# Patient Record
Sex: Male | Born: 1945
Health system: Southern US, Community
[De-identification: ages and names within clinical notes are randomized; demographics above are authoritative.]

## PROBLEM LIST (undated history)

## (undated) DIAGNOSIS — I82409 Acute embolism and thrombosis of unspecified deep veins of unspecified lower extremity: Secondary | ICD-10-CM

## (undated) DIAGNOSIS — I2699 Other pulmonary embolism without acute cor pulmonale: Secondary | ICD-10-CM

## (undated) DIAGNOSIS — E119 Type 2 diabetes mellitus without complications: Secondary | ICD-10-CM

## (undated) DIAGNOSIS — J9819 Other pulmonary collapse: Secondary | ICD-10-CM

## (undated) DIAGNOSIS — R600 Localized edema: Secondary | ICD-10-CM

## (undated) DIAGNOSIS — I10 Essential (primary) hypertension: Secondary | ICD-10-CM

## (undated) DIAGNOSIS — K589 Irritable bowel syndrome without diarrhea: Secondary | ICD-10-CM

## (undated) DIAGNOSIS — I4891 Unspecified atrial fibrillation: Secondary | ICD-10-CM

## (undated) HISTORY — DX: Irritable bowel syndrome, unspecified: K58.9

## (undated) HISTORY — DX: Other pulmonary embolism without acute cor pulmonale: I26.99

## (undated) HISTORY — DX: Other pulmonary collapse: J98.19

## (undated) HISTORY — DX: Acute embolism and thrombosis of unspecified deep veins of unspecified lower extremity: I82.409

## (undated) HISTORY — DX: Essential (primary) hypertension: I10

## (undated) HISTORY — DX: Localized edema: R60.0

## (undated) HISTORY — PX: EYE SURGERY: SHX253

## (undated) HISTORY — DX: Unspecified atrial fibrillation: I48.91

---

## 1986-08-07 HISTORY — PX: OTHER SURGICAL HISTORY: SHX169

## 1990-08-07 HISTORY — PX: HERNIA REPAIR: SHX51

## 2001-01-09 ENCOUNTER — Emergency Department (HOSPITAL_COMMUNITY): Admission: EM | Admit: 2001-01-09 | Discharge: 2001-01-09 | Payer: Self-pay | Admitting: Emergency Medicine

## 2001-01-09 ENCOUNTER — Encounter: Payer: Self-pay | Admitting: Emergency Medicine

## 2007-08-22 ENCOUNTER — Ambulatory Visit: Payer: Self-pay | Admitting: *Deleted

## 2007-08-29 ENCOUNTER — Ambulatory Visit: Payer: Self-pay | Admitting: *Deleted

## 2007-09-05 ENCOUNTER — Ambulatory Visit: Payer: Self-pay | Admitting: *Deleted

## 2007-09-12 ENCOUNTER — Ambulatory Visit: Payer: Self-pay | Admitting: *Deleted

## 2007-09-19 ENCOUNTER — Ambulatory Visit: Payer: Self-pay | Admitting: *Deleted

## 2007-09-27 ENCOUNTER — Ambulatory Visit: Payer: Self-pay | Admitting: *Deleted

## 2007-10-03 ENCOUNTER — Ambulatory Visit: Payer: Self-pay | Admitting: *Deleted

## 2007-10-11 ENCOUNTER — Ambulatory Visit: Payer: Self-pay | Admitting: Vascular Surgery

## 2007-10-14 ENCOUNTER — Ambulatory Visit: Payer: Self-pay | Admitting: Surgery

## 2007-10-15 ENCOUNTER — Ambulatory Visit: Payer: Self-pay | Admitting: Vascular Surgery

## 2007-10-17 ENCOUNTER — Ambulatory Visit: Payer: Self-pay | Admitting: *Deleted

## 2007-10-31 ENCOUNTER — Ambulatory Visit: Payer: Self-pay | Admitting: *Deleted

## 2007-11-14 ENCOUNTER — Ambulatory Visit: Payer: Self-pay | Admitting: *Deleted

## 2008-10-05 ENCOUNTER — Encounter: Admission: RE | Admit: 2008-10-05 | Discharge: 2008-10-05 | Payer: Self-pay | Admitting: Otolaryngology

## 2010-03-11 ENCOUNTER — Ambulatory Visit: Payer: Self-pay | Admitting: Cardiology

## 2010-03-25 ENCOUNTER — Ambulatory Visit: Payer: Self-pay | Admitting: Cardiology

## 2010-04-20 ENCOUNTER — Ambulatory Visit: Payer: Self-pay | Admitting: Cardiology

## 2010-05-23 ENCOUNTER — Ambulatory Visit: Payer: Self-pay | Admitting: Cardiology

## 2010-06-20 ENCOUNTER — Ambulatory Visit: Payer: Self-pay | Admitting: Cardiology

## 2010-07-14 ENCOUNTER — Ambulatory Visit: Payer: Self-pay | Admitting: Vascular Surgery

## 2010-07-19 ENCOUNTER — Ambulatory Visit: Payer: Self-pay | Admitting: Cardiology

## 2010-07-21 ENCOUNTER — Ambulatory Visit: Payer: Self-pay | Admitting: Vascular Surgery

## 2010-07-28 ENCOUNTER — Ambulatory Visit: Payer: Self-pay | Admitting: Vascular Surgery

## 2010-08-04 ENCOUNTER — Ambulatory Visit: Payer: Self-pay | Admitting: Vascular Surgery

## 2010-08-05 ENCOUNTER — Ambulatory Visit: Payer: Self-pay | Admitting: Vascular Surgery

## 2010-08-11 ENCOUNTER — Ambulatory Visit: Admit: 2010-08-11 | Payer: Self-pay

## 2010-08-22 ENCOUNTER — Ambulatory Visit: Payer: Self-pay | Admitting: Cardiology

## 2010-08-24 ENCOUNTER — Encounter (HOSPITAL_BASED_OUTPATIENT_CLINIC_OR_DEPARTMENT_OTHER)
Admission: RE | Admit: 2010-08-24 | Discharge: 2010-09-06 | Payer: Self-pay | Source: Home / Self Care | Attending: Internal Medicine | Admitting: Internal Medicine

## 2010-09-02 ENCOUNTER — Ambulatory Visit: Admit: 2010-09-02 | Payer: Self-pay | Admitting: Vascular Surgery

## 2010-09-05 ENCOUNTER — Ambulatory Visit: Admit: 2010-09-05 | Payer: Self-pay | Admitting: Vascular Surgery

## 2010-09-07 ENCOUNTER — Encounter (HOSPITAL_BASED_OUTPATIENT_CLINIC_OR_DEPARTMENT_OTHER): Payer: No Typology Code available for payment source | Attending: General Surgery

## 2010-09-07 DIAGNOSIS — I872 Venous insufficiency (chronic) (peripheral): Secondary | ICD-10-CM | POA: Insufficient documentation

## 2010-09-07 DIAGNOSIS — Z79899 Other long term (current) drug therapy: Secondary | ICD-10-CM | POA: Insufficient documentation

## 2010-09-07 DIAGNOSIS — Z7901 Long term (current) use of anticoagulants: Secondary | ICD-10-CM | POA: Insufficient documentation

## 2010-09-07 DIAGNOSIS — Z86718 Personal history of other venous thrombosis and embolism: Secondary | ICD-10-CM | POA: Insufficient documentation

## 2010-09-07 DIAGNOSIS — L97309 Non-pressure chronic ulcer of unspecified ankle with unspecified severity: Secondary | ICD-10-CM | POA: Insufficient documentation

## 2010-09-12 ENCOUNTER — Encounter (HOSPITAL_BASED_OUTPATIENT_CLINIC_OR_DEPARTMENT_OTHER): Payer: No Typology Code available for payment source

## 2010-09-14 ENCOUNTER — Other Ambulatory Visit (INDEPENDENT_AMBULATORY_CARE_PROVIDER_SITE_OTHER): Payer: No Typology Code available for payment source

## 2010-09-14 DIAGNOSIS — I4891 Unspecified atrial fibrillation: Secondary | ICD-10-CM

## 2010-09-14 DIAGNOSIS — Z7901 Long term (current) use of anticoagulants: Secondary | ICD-10-CM

## 2010-09-15 ENCOUNTER — Other Ambulatory Visit: Payer: Self-pay

## 2010-09-29 ENCOUNTER — Other Ambulatory Visit (INDEPENDENT_AMBULATORY_CARE_PROVIDER_SITE_OTHER): Payer: No Typology Code available for payment source

## 2010-09-29 DIAGNOSIS — I872 Venous insufficiency (chronic) (peripheral): Secondary | ICD-10-CM

## 2010-09-29 DIAGNOSIS — Z7901 Long term (current) use of anticoagulants: Secondary | ICD-10-CM

## 2010-10-10 ENCOUNTER — Encounter (HOSPITAL_BASED_OUTPATIENT_CLINIC_OR_DEPARTMENT_OTHER): Payer: No Typology Code available for payment source | Attending: General Surgery

## 2010-10-10 DIAGNOSIS — Z7901 Long term (current) use of anticoagulants: Secondary | ICD-10-CM | POA: Insufficient documentation

## 2010-10-10 DIAGNOSIS — Z79899 Other long term (current) drug therapy: Secondary | ICD-10-CM | POA: Insufficient documentation

## 2010-10-10 DIAGNOSIS — L97309 Non-pressure chronic ulcer of unspecified ankle with unspecified severity: Secondary | ICD-10-CM | POA: Insufficient documentation

## 2010-10-10 DIAGNOSIS — I872 Venous insufficiency (chronic) (peripheral): Secondary | ICD-10-CM | POA: Insufficient documentation

## 2010-10-10 DIAGNOSIS — Z86718 Personal history of other venous thrombosis and embolism: Secondary | ICD-10-CM | POA: Insufficient documentation

## 2010-10-11 ENCOUNTER — Other Ambulatory Visit (INDEPENDENT_AMBULATORY_CARE_PROVIDER_SITE_OTHER): Payer: No Typology Code available for payment source

## 2010-10-11 DIAGNOSIS — I4891 Unspecified atrial fibrillation: Secondary | ICD-10-CM

## 2010-10-11 DIAGNOSIS — Z7901 Long term (current) use of anticoagulants: Secondary | ICD-10-CM

## 2010-10-24 ENCOUNTER — Encounter: Payer: Self-pay | Admitting: *Deleted

## 2010-10-24 DIAGNOSIS — I4891 Unspecified atrial fibrillation: Secondary | ICD-10-CM | POA: Insufficient documentation

## 2010-10-25 ENCOUNTER — Encounter: Payer: No Typology Code available for payment source | Admitting: *Deleted

## 2010-10-27 ENCOUNTER — Encounter: Payer: No Typology Code available for payment source | Admitting: *Deleted

## 2010-11-07 ENCOUNTER — Encounter (HOSPITAL_BASED_OUTPATIENT_CLINIC_OR_DEPARTMENT_OTHER): Payer: No Typology Code available for payment source

## 2010-12-05 ENCOUNTER — Ambulatory Visit (INDEPENDENT_AMBULATORY_CARE_PROVIDER_SITE_OTHER): Payer: No Typology Code available for payment source | Admitting: *Deleted

## 2010-12-05 DIAGNOSIS — I4891 Unspecified atrial fibrillation: Secondary | ICD-10-CM

## 2010-12-05 LAB — POCT INR: INR: 2.6

## 2010-12-20 NOTE — Procedures (Signed)
DUPLEX DEEP VENOUS EXAM - LOWER EXTREMITY   INDICATION:  Left leg venous ulcer.   HISTORY:  Edema:  Chronic left leg edema.  Trauma/Surgery:  Ligation of varicose veins bilaterally in 1988.  Pain:  Left leg pain.  PE:  Patient had two pulmonary emboli in 1988.  Previous DVT:  Patient has a history of significant left leg DVT  following vein ligations in 1988.  Anticoagulants:  Patient takes an aspirin daily.  Other:  Left ankle ulcer for one month.  Patient has a Greenfield filter  in his vena cava.   DUPLEX EXAM:                CFV   SFV   PopV  PTV    GSV                R  L  R  L  R  L  R   L  R  L  Thrombosis    o  o  o  o  o  o  o   o  o  o  Spontaneous   +  +  +  +  +  +  +   +  +  +  Phasic        +  +  +  +  +  +  +   +  +  +  Augmentation  +  +  +  +  +  +  +   +  +  +  Compressible  +  +  +  +  +  +  +   +  +  +  Competent     0  0  0  0  +  0  0   0  0  0   Legend:  + - yes  o - no  p - partial  D - decreased   IMPRESSION:  1. No evidence of deep vein or superficial vein thrombus or baker's      cyst bilaterally.  2. Bilateral severe deep vein incompetence.  3. Bilateral varicose veins.  4. Bilateral incompetent saphenofemoral junctions.  5. 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    _____________________________  P. Liliane Bade, M.D.   MC/MEDQ  D:  08/22/2007  T:  08/22/2007  Job:  [id number]

## 2010-12-20 NOTE — Assessment & Plan Note (Signed)
OFFICE VISIT   William Stanley, William Stanley  DOB:  [birth year]                                       09/19/2007  [id number]#:[id number]   Patient returns today for continued followup of venous stasis ulcer,  left leg.  He continues weekly Unna boot changes.  Sees MD on a two-  weekly basis.   Since last seen, he has had marked improvement in the area of  excoriation and hemorrhagic skin changes around his left ankle ulcer.  The ulcer has continued to shrink.   BP is 131/80, pulse 82 per minute and regular, respirations 18 per  minute.  O2 sat 96%.  He is alert and oriented in no acute distress.   Continue Unna boot changes on a weekly basis.  Return to see MD in two  weeks.   Balinda Quails, M.D.  Electronically Signed   PGH/MEDQ  D:  09/19/2007  T:  09/21/2007  Job:  [id number]

## 2010-12-20 NOTE — Assessment & Plan Note (Signed)
OFFICE VISIT   William, [name]  DOB:  11-May-1946                                       10/14/2007  KVQQV#:[id number]   REASON FOR VISIT:  Evaluate left leg venous stasis ulcer.   HISTORY:  This is a 65 year old gentleman who has been followed by Dr.  Madilyn Fireman for a venous stasis ulcer on the left leg.  He has been undergoing  weekly Unna boot changes and seeing an MD on a bimonthly basis.  He  comes in today, having petechial changes of the skin in the region where  the [facility] boot was as well as worsening of his ulceration and more skin  breakdown.   Blood pressure today is 165/98, pulse 87, respirations 18, temperature  98.8.  The left extremity shows papular spots in the distribution of the  Unna boot, consistent with a contact dermatitis.  The ulcer has  increased in size, by report.  It now measures approximately 3 mm in  diameter.  There is a mild amount of erythema and skin sloughing in the  gaiter region of the left foot.   PLAN:  Left leg venous stasis ulcer.  I have discontinued the Unna boot  today and will place him on Xeroform and Ace wrap.  I am also giving him  a prescription for one week's worth of antibiotics.  He will follow up  with Dr. Madilyn Fireman in two weeks.  He is going to come in for daily dressing  changes.   Jorge Ny, MD  Electronically Signed   VWB/MEDQ  D:  10/14/2007  T:  10/14/2007  Job:  [id number]   cc:   Balinda Quails, M.D.

## 2010-12-20 NOTE — Assessment & Plan Note (Signed)
OFFICE VISIT   William Stanley, William Stanley  DOB:  1946/05/10                                       08/05/2010  XBMWU#:[id number]   This is an established patient wound care followup.  The patient I saw  last week and has been previously undergoing Unna boot changes for a  left venous stasis ulcer on the medial surface of the left leg.  He had  developed contact dermatitis to the [employer] and I had prescribed to  him some triamcinolone cream and we discontinued the [employer] use,  continuing to just an Ace wrap for compression.  At this point the  patient notes that the symptoms have greatly decreased.  He is not able  to put a compression stocking over his bandages on the left side.   PHYSICAL EXAMINATION:  The petechial rash he previously had from the  left calf down to the foot has greatly improved.  There are still some  areas of papilla in the distribution of where the previous Unna boot  was.  Today there is only one shallow ulcer on the medial anterior  surface of the calf.  There are also multiple varicosities that are  evident.  There is no drainage from any area.  No signs of any gangrene.   MEDICAL DECISION MAKING:  This is a 65 year old gentleman with venous  stasis ulcers that developed contact dermatitis reaction to the [employer].  He is improving with triamcinolone cream.  I would continue with  this and I will end up referring him to the wound care center at [facility] for continuation of Profore bandages to manage his venous stasis  ulcer.  Unfortunately we do not stock this particular compression system  in our office and subsequently will have to refer him out to get his  continued care.  He will follow up with [name] in another month to see how  he is doing.  Once again, thank you for giving [name] the opportunity to  participate in this gentleman's care.     Fransisco Hertz, MD  Electronically Signed   BLC/MEDQ  D:  08/05/2010  T:  08/05/2010  Job:   [phone]

## 2010-12-20 NOTE — Assessment & Plan Note (Signed)
OFFICE VISIT   KOHEI, ANTONELLIS  DOB:  November 08, 1945                                       07/29/2010  KGMWN#:[id number]   Established patient   HISTORY OF PRESENT ILLNESS:  This patient has been undergoing Unna boot  changes in his left leg.  Most recently was found to have possibly an  allergic reaction to the Unna boot and was given some Xeroform dressing  to protect his skin from the [employer].  He continued with the Unna boot  for his venous stasis ulcers in the left leg.  The patient notes that he  feels the ulcers look better than before.  He has not had any fevers or  chills and does have a slight amount of pruritus at his left leg.   PHYSICAL EXAMINATION:  On focused evaluation he had a circumferential  petechial rash around the left calf extending down to the foot in the  distribution of where the Unna boot had been applied.  There is no  streaking erythema.  No tenderness to palpation.  There are a few  shallow ulcers on the anterior surface of the foot and also the shin  consistent with venous stasis ulcers.  There is no drainage from any of  these ulcerations.  They all look clean.   MEDICAL DECISION MAKING:  A 65 year old gentleman with a likely contact  dermatitis reaction to the [employer].  The exact allergen is unclear to  me.  He previously has been given triamcinolone cream for this problem  and had good results.  I would at this point discontinue the Unna boot  at this point and I have prescribed him triamcinolone cream 0.05% cream.  He is going to apply to the affected areas twice a day.  He is going to  wrap the leg with Kerlix and then wrap an Ace wrap around this leg to  get some additional compression.  We will then see him in a week to see  if he is improved with that intervention.  Once he completely clears his  contact dermatitis my recommendation is we proceed forward with like a  Profore compression dressing rather than  continuing with Unna boot.     Fransisco Hertz, MD  Electronically Signed   BLC/MEDQ  D:  07/29/2010  T:  07/29/2010  Job:  [phone]

## 2010-12-20 NOTE — Assessment & Plan Note (Signed)
OFFICE VISIT   CHET, GREENLEY  DOB:  [birth year]                                       07/21/2010  YSAYT#:[id number]   The patient is a 65 year old male who was last seen on December 8 for a  venous stasis ulcer in the left leg.  He returns today for an Radio broadcast assistant  change.  He has had some mild skin reaction with a rash on the medial  aspect of his leg.  The venous stasis ulcer is still present  approximately 3 cm in diameter.  The graft was covered today with a  Xeroform dressing to protect his skin from the Unna boot medication.  We  will continue Unna boot therapy for a few more weeks to see if we can  get the ulcer to heal.     Janetta Hora. Fields, MD  Electronically Signed   CEF/MEDQ  D:  07/22/2010  T:  07/22/2010  Job:  [phone]

## 2010-12-20 NOTE — Assessment & Plan Note (Signed)
OFFICE VISIT   William Stanley, William Stanley  DOB:  25-Nov-1945                                       07/14/2010  EAVWU#:[id number]   The patient is a very pleasant 65 year old gentleman who has previously  been seen by Dr. Madilyn Fireman.  He was last seen by Dr. Madilyn Fireman in 2009 for  evaluation and management of a venous stasis ulcer on his left lower  extremity.  This eventually healed following treatment with Henriette Combs  therapy.  He now returns with a new ulcer.   This ulcer arose just prior to Thanksgiving.  He has been treating it  with a cream and a dressing.  He has not appreciated any improvement in  the wound.   PAST MEDICAL HISTORY:  Deep venous thrombosis with a pulmonary embolism  in 1988, with bilateral varicose vein stripping in 1988 and Greenfield  filter placement.  He is currently in atrial fibrillation.   CURRENT MEDICATIONS:  Coumadin and aspirin.   ALLERGIES:  Penicillin.   SOCIAL HISTORY:  He is married.  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  He has 3 children.  He continues to smoke approximately 1-1/2  packs of cigarettes per day.  He does not consume alcohol.   REVIEW OF SYSTEMS:  Consistent with some chest tightness and pressure,  shortness of breath and atrial fibrillation.  Also gastroesophageal  reflux and constipation.  He does have some wheezing.  All other  interrogatories were answered in the negative fashion.   PHYSICAL FINDINGS:  A very pleasant, elderly man in no apparent  distress.  Height was 5 feet 8 inches and weight was 212 pounds.  Heart  rate 77, blood pressure 163/108 and O2 saturation was 99%.  HEENT:  PERRLA, EOMI, with normal conjunctivae.  Mucous membranes were pink and  moist.  Trachea was midline.  Auscultation of the lungs did demonstrate  some scattered wheezing.  Cardiac exam revealed him to be in atrial  fibrillation.  I did not appreciate any murmurs or gallops.  Peripheral  pulses were palpable.  He did have  a good posterior tibial pulse in his  left lower extremity.  The abdomen was soft, nontender, nondistended.  Musculoskeletal exam demonstrated no major deformities.  Neurological  exam was nonfocal.  Evaluation of his skin did demonstrate a 1 x 1-cm  ulcer on the left lower extremity.  At this time, I requested Dr.  Darrick Penna' assistance in evaluating the patient.  Dr. Darrick Penna entered the  room.  He examined the extremity.  He palpated pulses.  He did discuss  placement of Unna Boot and [employer] therapy, which we will initiate at  this time.  He also discussed saphenous vein ablation with the patient;  however, with his history of bilateral varicose vein stripping this may  not be feasible.   ASSESSMENT/PLAN:  The patient will be placed in an [employer]  He will  be seen on a weekly basis by one of our nurses for changing of the [employer].  At the end of 4 weeks, he will again be seen by [name] so that we can  evaluate the healing of his ulcer.   Wilmon Arms, PA   Janetta Hora. Fields, MD  Electronically Signed   KEL/MEDQ  D:  07/14/2010  T:  07/14/2010  Job:  [id number]   cc:   Teena Irani. Arlyce Dice, M.D.

## 2010-12-20 NOTE — Assessment & Plan Note (Signed)
OFFICE VISIT   CORA, STETSON  DOB:  10/13/1945                                       07/14/2010  QMVHQ#:[id number]   ADDENDUM:  This is an addendum to the previously dictated note reference  number [id number].   Prior to his leaving  clinic, the patient was complaining of pain in his  left lower extremity.  He did request some pain medicine.  I gave him a  prescription for Ultram 50 mg 1 tablet p.o. q.6 h. p.r.n. pain, total  number of 10 tablets were given.   Wilmon Arms, PA   Janetta Hora. Fields, MD  Electronically Signed   KEL/MEDQ  D:  07/14/2010  T:  07/14/2010  Job:  [id number]

## 2010-12-20 NOTE — Assessment & Plan Note (Signed)
OFFICE VISIT   William Stanley, William Stanley  DOB:  February 18, 1946                                       10/31/2007  HYQMV#:[id number]   William Stanley continues to follow up regarding his left lower extremity  venous stasis ulcer.   He has been wearing Xeroform with Ace wrap.  This has healed  considerably.  He has completed a week's worth of antibiotics.   BP is 166/91, respirations are 18 and pulse is 81 per minute.  He is  alert and oriented, no distress.  The left lower extremity ulcer  continues to shrink.  This is now almost completely healed.   Will continue Ace wrap and Xeroform for another couple of weeks and  return to the office, at that time plan to be fitted with a venous  compression stocking.   Balinda Quails, M.D.  Electronically Signed   PGH/MEDQ  D:  10/31/2007  T:  11/01/2007  Job:  [id number]

## 2010-12-20 NOTE — Assessment & Plan Note (Signed)
OFFICE VISIT   William Stanley, William Stanley  DOB:  1945/11/21                                       09/05/2007  WUJWJ#:[id number]   The patient returns today for continued evaluation and management of  left leg venous stasis ulcer.  He has had 2 weeks of treatment with Unna  boot.  His previous culture revealed yeast and he was placed on  fluconazole.   His Unna boot has been removed now.  There is still considerable amount  of excoriation and hemorrhagic changes in the skin around the left ankle  ulcer.  This will be re-dressed today.   BP 162/90, pulse 89 per minute and regular, respirations 20 per minute.   Continue Unna boot changes on a weekly basis and return for an M.D.  visit in 2 weeks.   Balinda Quails, M.D.  Electronically Signed   PGH/MEDQ  D:  09/05/2007  T:  09/06/2007  Job:  [id number]

## 2010-12-20 NOTE — Assessment & Plan Note (Signed)
OFFICE VISIT   GLENDA, KUNST FRED  DOB:  1946-03-13                                       10/17/2007  ZOXWR#:[id number]   The patient returns today for continued management of his venous stasis  ulcer.  He was last here 3 days ago when he had a considerable amount of  excoriation and skin breakdown around the area of ulceration.  He had  been wearing an Radio broadcast assistant.   This was changed by Dr. Myra Gianotti appropriately to Xeroform and Ace wrap.  He was also placed on a week's worth of antibiotics.   His evaluation today revealed marked improvement with less excoriation.  There are still some satellite lesions around the skin.  His ulcer is  small.  BP is 147/91, pulse 79, and respirations 18, O2 saturation 97%.   Continue Xeroform and Ace wrap.  A 7-day course of fluconazole.  Plan  followup in a couple weeks here in the office.   Balinda Quails, M.D.  Electronically Signed   PGH/MEDQ  D:  10/17/2007  T:  10/18/2007  Job:  [id number]

## 2010-12-20 NOTE — Consult Note (Signed)
VASCULAR SURGERY CONSULTATION   PETRO, TALENT FRED  DOB:  07-Aug-1946                                       08/22/2007  XBMWU#:[id number]   CHIEF COMPLAINT:  Ulcer, left leg.   HISTORY:  Patient is a 65 year old male who has a history of pulmonary  embolus and deep venous thrombosis dating back to 14.  He has had  bilateral varicose veins strippings carried out in 1988.  He has a  Greenfield filter in place.  He was on Coumadin for one year following  his pulmonary embolus.  He has noted now for approximately one month an  ulcer that has developed in his left medial malleolus.  He had  previously been wearing compression stockings 30-40 mmHg; however,  discontinued these with involvement of the ulcer.   Denies any recent trauma to his leg.  He has been treating this with a  dressing and some Silvadene cream.   PAST MEDICAL HISTORY:  1. Deep venous thrombosis and pulmonary embolus in 1988.  2. Bilateral varicose vein stripping in 1988.  3. Greenfield filter placement.   MEDICATIONS:  Aspirin 325 mg daily.   ALLERGIES:  PENICILLIN.   SOCIAL HISTORY:  The patient is married.  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  He  smokes a pack of cigarettes about every three days.  Does not consume  alcohol.   REVIEW OF SYSTEMS:  He does note some shortness of breath occasionally  and some mild chest tightness.  He has a history of some asthma and  wheezing.  He has a hiatal hernia with reflux.  Some urinary frequency.  Chronic pain in his legs.  Joint discomfort.   PHYSICAL EXAMINATION:  A 65 year old male alert and oriented.  No acute  distress.  Vital signs:  BP 135/89.  Pulse is 80 per minute and regular.  Respirations are 18 per minute.  Abdomen is soft and nontender.  No  masses or organomegaly.  Moderate obesity.  Bowel sounds active.  No  bruits.  Lower extremities:  Femoral, popliteal, and posterior  tibial  pulses bilaterally are 2+.  Edema 2+ to the left leg.  Edema 1+ to the  right leg.  Chronic venous stasis changes noted in the left leg with a  left medial malleolar ulcer.  This is about 1 cm in diameter.  There is  a surrounding erythematous area.  Stasis dermatitis changes noted.   IMPRESSION:  1. Chronic venous insufficiency bilaterally, left greater than right,      lower extremities, with venous stasis ulcer, left medial malleolus.  2. History of deep venous thrombosis and pulmonary embolus.  3. Obesity.   RECOMMENDATIONS:  Patient will be placed at this time in an Unna boot in  the left leg.  Patient instructed to keep his leg elevated when  possible.  He is going to try to continue working at this time to see if  this [employer] is not too uncomfortable.  We will plan to change the  boot in one week and office visit to return to see me in two weeks.  Culture obtained for sensitivity from the ulcer.   Balinda Quails, M.D.  Electronically Signed  PGH/MEDQ  D:  08/22/2007  T:  08/23/2007  Job:  [id number]  cc:   Teena Irani. Arlyce Dice, M.D.

## 2010-12-20 NOTE — Assessment & Plan Note (Signed)
OFFICE VISIT   CHRISTY, FRIEDE  DOB:  05-Sep-1945                                       11/14/2007  ZOXWR#:[id number]   Patient returned to the office today for continued evaluation and  management of venous stasis ulcer, left lower extremity.   He has been treating this with Xeroform and Ace wrap.   PHYSICAL EXAMINATION:  The ulcer is now completely healed.  There is  still a considerable amount of dry skin around the area.  However, it  has completely reepithelialized.  BP is 153/93, pulse 94 per minute,  regular, respirations 18 per minute.   PLAN:  To be fitted with a 20-30 mm below-knee compression stocking.  Return p.r.n.   Balinda Quails, M.D.  Electronically Signed   PGH/MEDQ  D:  11/14/2007  T:  11/14/2007  Job:  [id number]   cc:   Teena Irani. Arlyce Dice, M.D.

## 2010-12-23 NOTE — Op Note (Signed)
[facility]  Patient:    William Stanley, William Stanley                         MRN: [id number] Proc. Date: 01/09/01 Attending:  Aron Baba, M.D.                           Operative Report  DATE OF BIRTH:  1945/09/08  HISTORY OF PRESENT ILLNESS:  I had the pleasure to see William Stanley today in the [facility] Emergency Room. He is a 65 year old white male who was mowing his yard and had the lawnmower blade cut through his right boot and into his right great toe sustaining a fracture of the great toe distal phalanx, nail bed injury and soft tissue laceration. The patient presented and was given Ancef as well as a tetanus shot and morphine. Subsequent to this, I was asked to see him. I have evaluated him at length. He complains of his right great toe only. He denies other injury.  PAST MEDICAL HISTORY:  History of a pulmonary embolus and now has a Greenfield filter, left pneumothorax x 2 in high school. Right inguinal hernia surgery and inflammatory bowel syndrome is part of his past medical history.  CURRENT MEDICATIONS:  One aspirin a day.  ALLERGIES:  PENICILLIN.  SOCIAL HISTORY:  He smokes a 1/2 pack per day. He is not employed.  PHYSICAL EXAMINATION:  GENERAL:  White male, alert and oriented in no acute distress.  VITAL SIGNS:  Stable.  NECK/BACK/CHEST:  Benign.  PELVIS:  Stable.  EXTREMITIES:  Lower extremities are atraumatic except for a laceration to the right great toe with exposed distal phalanx, nail bed laceration and skin avulsion. The nail is lifted off. X-rays show a distal phalanx fracture comminuted in nature distally though does not extend into the articular surface.  IMPRESSION:  Open distal phalanx fracture right great toe with nail bed injury, nail plate injury and laceration to the skin.  PLAN:  I verbally consented for I&D and repair of structures as necessary.  DESCRIPTION OF PROCEDURE:  The patient was taken to the  procedure area, he was given a lidocaine Marcaine  mixture block without epinephrine following receiving anesthesia. He was given 5 mg of morphine IV and then underwent I&D. He was given a Betadine scrub followed by Betadine painting and then copious irrigation of greater than 3 liters of saline. I&D of the skin and subcutaneous tissue, bone and nail plate was accomplished. The nail plate was removed and the nail bed was I&Dd as well. The I&D included excisional debridement and was performed without difficulty. Once the excisional debridement was performed, the patient then underwent a step cut about the ulnar corner/lateral corner of the eponychia and this was brought back and the nail bed laceration was identified and repaired with chromic suture without difficulty under 4.0 loop magnification. Following this, the lateral fold was repaired about the toe and approximated nicely. The skin was trimmed as well and sutured with 4-0 Prolene. The patient thus in all underwent I&D of the skin, subcutaneous tissue and bone, open treatment for a distal phalanx fracture, nail bed repair and nail plate removal. This was done without difficulty. This was done with the tourniquet up during the nail bed repair I should note. Once the tourniquet was deflated, the patient had irrigation continued to the wound followed by placement of Adaptic under the eponychial fold.  Following this, the patient had xeroform followed by a sterile dressing with a toe splint placed. The patient tolerated the procedure well without difficulty.  He was discharged home on Keflex 500 mg one p.o. b.i.d. x 10 days, Percocet 1-2 q. 4-6 p.r.n. pain, p.o. Robaxin dispensed #30 and Phenergan p.r.n.  He will notify me should any problems, questions or concerns arise but otherwise look forward to seeing him in 10 days for a wound check in the office. All questions have been encouraged and answered.  DD:  01/09/01 TD:  01/10/01 Job:  [id number]

## 2011-01-04 ENCOUNTER — Encounter: Payer: Self-pay | Admitting: Cardiology

## 2011-01-05 ENCOUNTER — Ambulatory Visit (INDEPENDENT_AMBULATORY_CARE_PROVIDER_SITE_OTHER): Payer: No Typology Code available for payment source | Admitting: *Deleted

## 2011-01-05 ENCOUNTER — Encounter: Payer: No Typology Code available for payment source | Admitting: *Deleted

## 2011-01-05 ENCOUNTER — Encounter: Payer: Self-pay | Admitting: Cardiology

## 2011-01-05 ENCOUNTER — Ambulatory Visit (INDEPENDENT_AMBULATORY_CARE_PROVIDER_SITE_OTHER): Payer: No Typology Code available for payment source | Admitting: Cardiology

## 2011-01-05 DIAGNOSIS — Z7901 Long term (current) use of anticoagulants: Secondary | ICD-10-CM

## 2011-01-05 DIAGNOSIS — I4891 Unspecified atrial fibrillation: Secondary | ICD-10-CM

## 2011-01-05 LAB — POCT INR
INR: 2.9
INR: 2.9

## 2011-01-05 NOTE — Progress Notes (Signed)
Subjective:   William Stanley has atrial fibrillation and is essentially asymptomatic. He has been on warfarin anticoagulation in the past 8 months. He previously had a history of DVT and pulmonary emboli in 1988 and was on warfarin at that time. Overall, he has been doing well without any significant symptoms of palpitations, chest pain, or shortness of breath.  He does have ongoing problems of tobacco abuse, hyperlipemia, and mild obesity. She only gets 3 hours of sleep each night.  Current Outpatient Prescriptions  Medication Sig Dispense Refill  . niacin (NIASPAN) 500 MG CR tablet Take 500 mg by mouth at bedtime.        . VENTOLIN HFA 108 (90 BASE) MCG/ACT inhaler Inhale 1-2 puffs into the lungs as needed.      . warfarin (COUMADIN) 7.5 MG tablet Take 7.5 mg by mouth as directed.          Allergies  Allergen Reactions  . Penicillins     Patient Active Problem List  Diagnoses  . Atrial fibrillation    History  Smoking status  . Current Everyday Smoker  . Types: Cigarettes  Smokeless tobacco  . Never Used  Comment: pk a week    History  Alcohol Use No    Family History  Problem Relation Age of Onset  . Heart attack Mother   . Heart failure Mother   . Arrhythmia Father   . Heart failure Father     Review of Systems:   The patient denies any heat or cold intolerance.  No weight gain or weight loss.  The patient denies headaches or blurry vision.  There is no cough or sputum production.  The patient denies dizziness.  There is no hematuria or hematochezia.  The patient denies any muscle aches or arthritis.  The patient denies any rash.  The patient denies frequent falling or instability.  There is no history of depression or anxiety.  All other systems were reviewed and are negative.   Physical Exam:   Weight is 219. Blood pressure is 121/90. Heart rate is 78 and irregular. He is moderately obese.The head is normocephalic and atraumatic.  Pupils are equally round and  reactive to light.  Sclerae nonicteric.  Conjunctiva is clear.  Oropharynx is unremarkable.  There's adequate oral airway.  Neck is supple there are no masses.  Thyroid is not enlarged.  There is no lymphadenopathy.  Lungs are clear.  Chest is symmetric.  Heart shows a irregular rate and rhythm.  S1 and S2 are normal.  There is no murmur click or gallop.  Abdomen is soft normal bowel sounds.  There is no organomegaly.  Genital and rectal deferred.  Extremities are without edema.  Peripheral pulses are adequate.  Neurologically intact.  Full range of motion.  The patient is not depressed.  Skin is warm and dry.  Assessment / Plan:

## 2011-01-05 NOTE — Assessment & Plan Note (Signed)
INR is 2.9 today. We'll continue to check in 4 weeks and otherwise defer the Coumadin clinic

## 2011-01-05 NOTE — Assessment & Plan Note (Signed)
Overall, he remains asymptomatic from his atrial fibrillation. His INR is 2.9 and we'll continue his warfarin anticoagulation. I will have him see Dr. Jens Som in followup. He had a relative care for by Dr. Jens Som and would like to continue followup.

## 2011-02-02 ENCOUNTER — Encounter: Payer: No Typology Code available for payment source | Admitting: *Deleted

## 2011-02-06 ENCOUNTER — Ambulatory Visit (INDEPENDENT_AMBULATORY_CARE_PROVIDER_SITE_OTHER): Payer: No Typology Code available for payment source | Admitting: *Deleted

## 2011-02-06 DIAGNOSIS — I4891 Unspecified atrial fibrillation: Secondary | ICD-10-CM

## 2011-02-16 ENCOUNTER — Encounter (HOSPITAL_BASED_OUTPATIENT_CLINIC_OR_DEPARTMENT_OTHER): Payer: No Typology Code available for payment source | Attending: Internal Medicine

## 2011-02-16 DIAGNOSIS — I872 Venous insufficiency (chronic) (peripheral): Secondary | ICD-10-CM | POA: Insufficient documentation

## 2011-02-16 DIAGNOSIS — Z79899 Other long term (current) drug therapy: Secondary | ICD-10-CM | POA: Insufficient documentation

## 2011-02-16 DIAGNOSIS — Z86718 Personal history of other venous thrombosis and embolism: Secondary | ICD-10-CM | POA: Insufficient documentation

## 2011-02-16 DIAGNOSIS — L97809 Non-pressure chronic ulcer of other part of unspecified lower leg with unspecified severity: Secondary | ICD-10-CM | POA: Insufficient documentation

## 2011-02-16 DIAGNOSIS — Z7901 Long term (current) use of anticoagulants: Secondary | ICD-10-CM | POA: Insufficient documentation

## 2011-02-16 NOTE — Progress Notes (Unsigned)
[facility]  NAME:  William Stanley, William Stanley              ACCOUNT NO.:  [id number]  MEDICAL RECORD NO.:  [id number]      DATE OF BIRTH:  1945/09/11  PHYSICIAN:  Maxwell Caul, M.D. VISIT DATE:  02/16/2011                                  OFFICE VISIT   William Stanley is a gentleman who is 65 years old.  We actually know him from a review here earlier this year, at which time he presented with a venous stasis ulcer on his left lower medial leg.  This apparently healed with simple measures in 3 or 4 weeks.  I do not see that he had any vascular workup, although he tells me he has seen Dr. Darrick Penna in the past.  The history provided this time is roughly a week ago he noted a small area in the left lower medial ankle.  This is rapidly expanded over the course of a week.  He had nothing to put on this.  He was wearing compression stockings.  PAST MEDICAL HISTORY:  Relevant for: 1. Recurrent venous stasis ulcers, initially reviewed here in January     2012. 2. He has a history of asthma. 3. History of recurrent DVTs and PEs with a Greenfield filter in     place, on chronic Coumadin. 4. Atrial fibrillation. 5. Vein ligation. 6. Hiatal hernia repair. 7. Irritable bowel syndrome.  MEDICATIONS:  Reviewed.  Most notable for Coumadin and pravastatin.  ALLERGIES:  Noted to UNNA BOOTS.  REVIEW OF SYSTEMS:  GENERAL:  No fever.  RESPIRATORY:  No current shortness of breath, although he notes episodic wheezing which he says is due to asthma.  PHYSICAL EXAMINATION:  VITAL SIGNS:  Temperature is 97.7, pulse 93, respirations 18, blood pressure 161/115. RESPIRATORY:  Isolated inspiratory and expiratory wheeze over the left lower lobe but he is in no distress.  There is no accessory muscle use. CARDIAC:  Heart sounds are normal and he does not have an elevated JVP. There was no S3. EXTREMITIES:  He has venous stasis, 2+ edema bilaterally.  No evidence of a DVT.  The wound is on  the left lower extremity medially measuring collectively 0.6 x 0.7 x 0.1.  There are four small areas here, each of them where nonselectively debrided of surface eschar.  They clean up fairly nicely.  There is an area of surrounding severe dermatitis here which I think is secondary to chronic stasis.  IMPRESSION:  Recurrent venous stasis disease, debrided as noted above. We applied silver, collagen, trichloroacetic acid, and zinc to the periwound foam under a Profore Lite dressing (notable for allergies to UNNA BOOTS).  We will review him in a week.  I had initially thought that a referral to [facility] might be in order, however, with the history of a Greenfield filter, I am not really sure that much can be done here.          ______________________________ Maxwell Caul, M.D.     MGR/MEDQ  D:  02/16/2011  T:  02/16/2011  Job:  [id number]

## 2011-02-27 ENCOUNTER — Encounter: Payer: Self-pay | Admitting: Vascular Surgery

## 2011-03-07 ENCOUNTER — Encounter: Payer: No Typology Code available for payment source | Admitting: *Deleted

## 2011-03-09 ENCOUNTER — Encounter (HOSPITAL_BASED_OUTPATIENT_CLINIC_OR_DEPARTMENT_OTHER): Payer: No Typology Code available for payment source | Attending: Internal Medicine

## 2011-03-09 DIAGNOSIS — Z7901 Long term (current) use of anticoagulants: Secondary | ICD-10-CM | POA: Insufficient documentation

## 2011-03-09 DIAGNOSIS — Z79899 Other long term (current) drug therapy: Secondary | ICD-10-CM | POA: Insufficient documentation

## 2011-03-09 DIAGNOSIS — I872 Venous insufficiency (chronic) (peripheral): Secondary | ICD-10-CM | POA: Insufficient documentation

## 2011-03-09 DIAGNOSIS — L97809 Non-pressure chronic ulcer of other part of unspecified lower leg with unspecified severity: Secondary | ICD-10-CM | POA: Insufficient documentation

## 2011-03-09 DIAGNOSIS — Z86718 Personal history of other venous thrombosis and embolism: Secondary | ICD-10-CM | POA: Insufficient documentation

## 2011-03-13 ENCOUNTER — Encounter (INDEPENDENT_AMBULATORY_CARE_PROVIDER_SITE_OTHER): Payer: No Typology Code available for payment source

## 2011-03-13 DIAGNOSIS — L97909 Non-pressure chronic ulcer of unspecified part of unspecified lower leg with unspecified severity: Secondary | ICD-10-CM

## 2011-03-14 ENCOUNTER — Encounter: Payer: Self-pay | Admitting: Vascular Surgery

## 2011-03-14 ENCOUNTER — Ambulatory Visit (INDEPENDENT_AMBULATORY_CARE_PROVIDER_SITE_OTHER): Payer: No Typology Code available for payment source | Admitting: Vascular Surgery

## 2011-03-14 VITALS — BP 165/116 | HR 86 | Resp 16 | Ht 68.0 in | Wt 219.0 lb

## 2011-03-14 DIAGNOSIS — L97909 Non-pressure chronic ulcer of unspecified part of unspecified lower leg with unspecified severity: Secondary | ICD-10-CM

## 2011-03-14 DIAGNOSIS — I7092 Chronic total occlusion of artery of the extremities: Secondary | ICD-10-CM

## 2011-03-14 NOTE — Progress Notes (Signed)
Subjective:     Patient ID: William Stanley, male   DOB: [birth year], 65 y.o.   MRN: [id number]  HPI The patient presents today for continued followup of chronic venous stasis disease left leg. He was most recently seen by Dr. Imogene Burn December of 2011. He continues to have local wound care at [facility] wound center. He had an allergic reaction to an Unna boot in the past. He is seen today to rule out any correctable arterial or venous pathology.  Review of Systems No change    Past Medical History  Diagnosis Date  . Edema of lower extremity   . Ulceration   . Hypertension   . Lung collapse     LEFT LUNG, TWICE  . DVT (deep venous thrombosis)   . Pulmonary embolism     History  Substance Use Topics  . Smoking status: Current Everyday Smoker -- 0.2 packs/day    Types: Cigarettes  . Smokeless tobacco: Never Used   Comment: pk a week  . Alcohol Use: No    Family History  Problem Relation Age of Onset  . Heart attack Mother   . Heart failure Mother   . Arrhythmia Father   . Heart failure Father     Allergies  Allergen Reactions  . Penicillins     Current outpatient prescriptions:pravastatin (PRAVACHOL) 20 MG tablet, Take 20 mg by mouth daily.  , Disp: , Rfl: ;  VENTOLIN HFA 108 (90 BASE) MCG/ACT inhaler, Inhale 1-2 puffs into the lungs as needed., Disp: , Rfl: ;  warfarin (COUMADIN) 7.5 MG tablet, Take 7.5 mg by mouth as directed. Takes coumadin 7.5 on Sunday, Tuesday, Thursday, Saturday.  Takes Coumadin 5 mg. On Monday, Wednesday and Friday., Disp: , Rfl:  niacin (NIASPAN) 500 MG CR tablet, Take 500 mg by mouth at bedtime.  , Disp: , Rfl:   Filed Vitals:   03/14/11 1422  Height: 5\' 8"  (1.727 m)  Weight: 219 lb (99.338 kg)    Body mass index is 33.30 kg/(m^2).       Objective:   Physical Exam Well-developed well-nourished white male in no acute distress. palpable dorsalis pedis pulses bilaterally. He has multiple small superficial ulcerations over the medial  aspect of his left ankle. There is no evidence of invasive infection.  Vascular lab studies: Normal triphasic arterial signals at the dorsalis pedis and posterior tibial bilaterally. Venous studies showing deep venous insufficiency bilaterally.    Assessment:     Chronic venous stasis disease and ulcer left leg. He has had his saphenous vein stripped from his groin to his ankle on the left. He has no surgical option. He will continue elevation and compression as is being done at the wound center.    Plan:     Follow up with [facility]. Follow up with [name] as needed.

## 2011-03-14 NOTE — Progress Notes (Signed)
PAD, chronic venous insufficiency, venous stasis ulcer left ankle.

## 2011-03-27 NOTE — Procedures (Unsigned)
DUPLEX DEEP VENOUS EXAM - LOWER EXTREMITY  INDICATION:  Left lower extremity ulcer.  HISTORY:  Edema:  Chronic left lower extremity edema. Trauma/Surgery:  Ligation of varicose veins bilaterally in 1988. Pain:  Yes. PE:  Two pulmonary embolisms in 1988. Previous DVT:  Yes. Anticoagulants:  No. Other:  History of left lower extremity DVT.  The patient has Greenfield filter and IVC.  DUPLEX EXAM:               CFV   SFV   PopV  PTV    GSV               R  L  R  L  R  L  R   L  R  L Thrombosis    o  o  o  o  o  +  o   o  o  o Spontaneous   +  +  +  +  +  +  +   +  +  + Phasic        +  +  +  +  +  +  +   +  +  + Augmentation  +  +  +  +  +  +  +   +  +  + Compressible  +  +  +  +  +  P  +   +  +  + Competent     o  o  o  o  o  o  o   o  o  o  Legend:  + - yes  o - no  p - partial  D - decreased   IMPRESSION: 1. No evidence of acute deep venous thrombosis. 2. Chronic deep venous thrombosis noted in the left popliteal vein     consistent with the patient's medical history.  Clinically     significant venous insufficiency of the deep and superficial veins     of bilateral lower extremities.         _____________________________ Larina Earthly, M.D.  EM/MEDQ  D:  03/14/2011  T:  03/14/2011  Job:  [id number]

## 2011-04-13 ENCOUNTER — Encounter (HOSPITAL_BASED_OUTPATIENT_CLINIC_OR_DEPARTMENT_OTHER): Payer: No Typology Code available for payment source | Attending: Internal Medicine

## 2011-04-13 DIAGNOSIS — I872 Venous insufficiency (chronic) (peripheral): Secondary | ICD-10-CM | POA: Insufficient documentation

## 2011-04-13 DIAGNOSIS — Z7901 Long term (current) use of anticoagulants: Secondary | ICD-10-CM | POA: Insufficient documentation

## 2011-04-13 DIAGNOSIS — Z86718 Personal history of other venous thrombosis and embolism: Secondary | ICD-10-CM | POA: Insufficient documentation

## 2011-04-13 DIAGNOSIS — L97809 Non-pressure chronic ulcer of other part of unspecified lower leg with unspecified severity: Secondary | ICD-10-CM | POA: Insufficient documentation

## 2011-04-13 DIAGNOSIS — I4891 Unspecified atrial fibrillation: Secondary | ICD-10-CM | POA: Insufficient documentation

## 2011-04-13 DIAGNOSIS — Z79899 Other long term (current) drug therapy: Secondary | ICD-10-CM | POA: Insufficient documentation

## 2011-04-20 ENCOUNTER — Encounter (INDEPENDENT_AMBULATORY_CARE_PROVIDER_SITE_OTHER): Payer: No Typology Code available for payment source

## 2011-04-20 DIAGNOSIS — I83893 Varicose veins of bilateral lower extremities with other complications: Secondary | ICD-10-CM

## 2011-05-05 ENCOUNTER — Ambulatory Visit: Payer: No Typology Code available for payment source | Admitting: Cardiology

## 2011-05-11 ENCOUNTER — Encounter (HOSPITAL_BASED_OUTPATIENT_CLINIC_OR_DEPARTMENT_OTHER): Payer: No Typology Code available for payment source

## 2011-10-25 ENCOUNTER — Other Ambulatory Visit: Payer: Self-pay | Admitting: Nurse Practitioner

## 2011-11-13 ENCOUNTER — Other Ambulatory Visit: Payer: Self-pay | Admitting: Nurse Practitioner

## 2011-12-12 ENCOUNTER — Other Ambulatory Visit: Payer: Self-pay | Admitting: Nurse Practitioner

## 2011-12-12 ENCOUNTER — Encounter: Payer: Self-pay | Admitting: Cardiology

## 2011-12-12 ENCOUNTER — Ambulatory Visit (INDEPENDENT_AMBULATORY_CARE_PROVIDER_SITE_OTHER): Payer: Medicare Other | Admitting: *Deleted

## 2011-12-12 ENCOUNTER — Ambulatory Visit (INDEPENDENT_AMBULATORY_CARE_PROVIDER_SITE_OTHER): Payer: Medicare Other | Admitting: Cardiology

## 2011-12-12 VITALS — BP 142/98 | HR 80 | Ht 68.0 in | Wt 237.0 lb

## 2011-12-12 DIAGNOSIS — I10 Essential (primary) hypertension: Secondary | ICD-10-CM | POA: Insufficient documentation

## 2011-12-12 DIAGNOSIS — I4891 Unspecified atrial fibrillation: Secondary | ICD-10-CM

## 2011-12-12 DIAGNOSIS — Z7901 Long term (current) use of anticoagulants: Secondary | ICD-10-CM

## 2011-12-12 DIAGNOSIS — E785 Hyperlipidemia, unspecified: Secondary | ICD-10-CM

## 2011-12-12 DIAGNOSIS — Z720 Tobacco use: Secondary | ICD-10-CM | POA: Insufficient documentation

## 2011-12-12 LAB — HEPATIC FUNCTION PANEL
ALT: 23 U/L (ref 0–53)
Alkaline Phosphatase: 45 U/L (ref 39–117)
Bilirubin, Direct: 0.2 mg/dL (ref 0.0–0.3)
Total Bilirubin: 0.8 mg/dL (ref 0.3–1.2)
Total Protein: 7.2 g/dL (ref 6.0–8.3)

## 2011-12-12 LAB — LIPID PANEL
HDL: 34.2 mg/dL — ABNORMAL LOW (ref >39.00)
LDL Cholesterol: 79 mg/dL (ref 0–99)
Total CHOL/HDL Ratio: 4
Triglycerides: 147 mg/dL (ref 0.0–149.0)
VLDL: 29.4 mg/dL (ref 0.0–40.0)

## 2011-12-12 LAB — CBC WITH DIFFERENTIAL/PLATELET
Basophils Relative: 0.6 % (ref 0.0–3.0)
Eosinophils Absolute: 0.1 10*3/uL (ref 0.0–0.7)
Eosinophils Relative: 1.2 % (ref 0.0–5.0)
Hemoglobin: 16.4 g/dL (ref 13.0–17.0)
Lymphocytes Relative: 20.9 % (ref 12.0–46.0)
MCHC: 34 g/dL (ref 30.0–36.0)
Monocytes Relative: 6.5 % (ref 3.0–12.0)
Neutro Abs: 6.6 10*3/uL (ref 1.4–7.7)
Neutrophils Relative %: 70.8 % (ref 43.0–77.0)
RBC: 5.15 Mil/uL (ref 4.22–5.81)
WBC: 9.4 10*3/uL (ref 4.5–10.5)

## 2011-12-12 LAB — BASIC METABOLIC PANEL
BUN: 16 mg/dL (ref 6–23)
Chloride: 101 mEq/L (ref 96–112)
Creatinine, Ser: 1.1 mg/dL (ref 0.4–1.5)
Glucose, Bld: 103 mg/dL — ABNORMAL HIGH (ref 70–99)
Potassium: 4.1 mEq/L (ref 3.5–5.1)

## 2011-12-12 LAB — TSH: TSH: 0.71 u[IU]/mL (ref 0.35–5.50)

## 2011-12-12 MED ORDER — AMLODIPINE BESYLATE 5 MG PO TABS: 5.0000 mg | ORAL_TABLET | Freq: Every day | ORAL | Status: DC

## 2011-12-12 NOTE — Progress Notes (Signed)
   HPI: Pleasant male previously followed by Dr Deborah Chalk for fu of atrial fibrillation. He also has a history of DVT and pulmonary emboli in 1988. Last echocardiogram in December of 2010 showed normal LV function. There was mild left ventricular hypertrophy. There was mild mitral regurgitation. Myoview in December of 2010 showed normal perfusion and normal LV function with an ejection fraction of 56%. Patient last seen in May of 2012. Since then, the patient has dyspnea with more extreme activities but not with routine activities. It is relieved with rest. It is not associated with chest pain. There is no orthopnea, PND or pedal edema. There is no syncope or palpitations. There is no exertional chest pain.    Current Outpatient Prescriptions  Medication Sig Dispense Refill  . pravastatin (PRAVACHOL) 20 MG tablet TAKE 1 TABLET EVERY DAY  90 tablet  1  . VENTOLIN HFA 108 (90 BASE) MCG/ACT inhaler Inhale 1-2 puffs into the lungs as needed.      . warfarin (COUMADIN) 7.5 MG tablet Take 7.5 mg by mouth as directed. Takes coumadin 7.5 on Sunday, Tuesday, Thursday, Saturday.  Takes Coumadin 5 mg. On Monday, Wednesday and Friday.         Past Medical History  Diagnosis Date  . Edema of lower extremity   . Hypertension   . Lung collapse     LEFT LUNG, TWICE  . DVT (deep venous thrombosis)   . Pulmonary embolism   . Atrial fibrillation     Past Surgical History  Procedure Date  . Venous ligation surgery 1988  . Hernia repair 1992    History   Social History  . Marital Status: Married    Spouse Name: N/A    Number of Children: N/A  . Years of Education: N/A   Occupational History  . Not on file.   Social History Main Topics  . Smoking status: Current Everyday Smoker -- 0.2 packs/day    Types: Cigarettes  . Smokeless tobacco: Never Used   Comment: pk a week  . Alcohol Use: No  . Drug Use: No  . Sexually Active: Not on file   Other Topics Concern  . Not on file   Social History  Narrative  . No narrative on file    ROS: no fevers or chills, productive cough, hemoptysis, dysphasia, odynophagia, melena, hematochezia, dysuria, hematuria, rash, seizure activity, orthopnea, PND, pedal edema, claudication. Remaining systems are negative.  Physical Exam: Well-developed well-nourished in no acute distress.  Skin is warm and dry.  HEENT is normal.  Neck is supple.  Chest is clear to auscultation with normal expansion.  Cardiovascular exam is irregular Abdominal exam nontender or distended. No masses palpated. Extremities show no edema. neuro grossly intact  ECG atrial fibrillation at a rate of 80. Axis normal. Nonspecific ST changes.

## 2011-12-12 NOTE — Patient Instructions (Signed)
Your physician wants you to follow-up in: 6 MONTHS WITH DR Alphonsa Gin You will receive a reminder letter in the mail two months in advance. If you don't receive a letter, please call our office to schedule the follow-up appointment.   START AMLODIPINE 5 MG ONCE DAILY  Your physician has recommended that you wear a 48 HOUR holter monitor. Holter monitors are medical devices that record the heart's electrical activity. Doctors most often use these monitors to diagnose arrhythmias. Arrhythmias are problems with the speed or rhythm of the heartbeat. The monitor is a small, portable device. You can wear one while you do your normal daily activities. This is usually used to diagnose what is causing palpitations/syncope (passing out).   Your physician recommends that you HAVE LAB WORK TODAY

## 2011-12-12 NOTE — Assessment & Plan Note (Signed)
Monitored in the Coumadin clinic. Goal INR 2-3. Check CBC.

## 2011-12-12 NOTE — Assessment & Plan Note (Signed)
Continue statin. Check lipids and liver. 

## 2011-12-12 NOTE — Assessment & Plan Note (Signed)
Patient is in atrial fibrillation today. He is asymptomatic and his heart rate is in the 80s. Plan continue Coumadin as he has embolic risk factors of hypertension. Plan check TSH. Check 48 hour Holter monitor to make sure that rate is controlled. Given that he is asymptomatic rate control and anticoagulation is most likely best option. It is unclear how long he has been in atrial fibrillation.

## 2011-12-12 NOTE — Assessment & Plan Note (Signed)
Blood pressure elevated. Add Norvasc 5 mg daily. Await results of monitor. He may require an AV nodal blocking agent as well.

## 2011-12-12 NOTE — Assessment & Plan Note (Signed)
Patient counseled on discontinuing. 

## 2011-12-13 ENCOUNTER — Encounter: Payer: Self-pay | Admitting: *Deleted

## 2012-01-09 ENCOUNTER — Ambulatory Visit (INDEPENDENT_AMBULATORY_CARE_PROVIDER_SITE_OTHER): Payer: Medicare Other

## 2012-01-09 DIAGNOSIS — I4891 Unspecified atrial fibrillation: Secondary | ICD-10-CM

## 2012-01-09 DIAGNOSIS — Z7901 Long term (current) use of anticoagulants: Secondary | ICD-10-CM

## 2012-01-19 ENCOUNTER — Encounter (INDEPENDENT_AMBULATORY_CARE_PROVIDER_SITE_OTHER): Payer: Medicare Other

## 2012-01-19 DIAGNOSIS — I4891 Unspecified atrial fibrillation: Secondary | ICD-10-CM

## 2012-01-29 ENCOUNTER — Telehealth: Payer: Self-pay | Admitting: *Deleted

## 2012-01-29 NOTE — Telephone Encounter (Signed)
Left message fpr pt to call, monitor reviewed by dr Jens Som shows atrial fib with controlled rate.

## 2012-01-30 NOTE — Telephone Encounter (Signed)
Fu msg Pt returning your call 

## 2012-01-30 NOTE — Telephone Encounter (Signed)
Spoke with pt, .aware of results.  

## 2012-02-12 ENCOUNTER — Ambulatory Visit (INDEPENDENT_AMBULATORY_CARE_PROVIDER_SITE_OTHER): Payer: Medicare Other

## 2012-02-12 DIAGNOSIS — I4891 Unspecified atrial fibrillation: Secondary | ICD-10-CM

## 2012-02-12 DIAGNOSIS — Z7901 Long term (current) use of anticoagulants: Secondary | ICD-10-CM

## 2012-02-12 LAB — POCT INR: INR: 3

## 2012-03-25 ENCOUNTER — Ambulatory Visit (INDEPENDENT_AMBULATORY_CARE_PROVIDER_SITE_OTHER): Payer: Medicare Other | Admitting: *Deleted

## 2012-03-25 DIAGNOSIS — Z7901 Long term (current) use of anticoagulants: Secondary | ICD-10-CM

## 2012-03-25 DIAGNOSIS — I4891 Unspecified atrial fibrillation: Secondary | ICD-10-CM

## 2012-03-25 LAB — POCT INR: INR: 2.5

## 2012-03-25 MED ORDER — WARFARIN SODIUM 5 MG PO TABS: ORAL_TABLET | ORAL | Status: DC

## 2012-04-26 ENCOUNTER — Other Ambulatory Visit: Payer: Self-pay | Admitting: Nurse Practitioner

## 2012-05-06 ENCOUNTER — Ambulatory Visit (INDEPENDENT_AMBULATORY_CARE_PROVIDER_SITE_OTHER): Payer: Medicare Other | Admitting: *Deleted

## 2012-05-06 DIAGNOSIS — I4891 Unspecified atrial fibrillation: Secondary | ICD-10-CM

## 2012-05-06 DIAGNOSIS — Z7901 Long term (current) use of anticoagulants: Secondary | ICD-10-CM

## 2012-06-17 ENCOUNTER — Encounter: Payer: Self-pay | Admitting: Cardiology

## 2012-06-17 ENCOUNTER — Telehealth: Payer: Self-pay | Admitting: Cardiology

## 2012-06-17 ENCOUNTER — Ambulatory Visit (INDEPENDENT_AMBULATORY_CARE_PROVIDER_SITE_OTHER): Payer: Medicare Other | Admitting: *Deleted

## 2012-06-17 ENCOUNTER — Ambulatory Visit (INDEPENDENT_AMBULATORY_CARE_PROVIDER_SITE_OTHER): Payer: Medicare Other | Admitting: Cardiology

## 2012-06-17 VITALS — BP 121/80 | HR 68 | Ht 68.0 in | Wt 226.6 lb

## 2012-06-17 DIAGNOSIS — Z7901 Long term (current) use of anticoagulants: Secondary | ICD-10-CM

## 2012-06-17 DIAGNOSIS — F17200 Nicotine dependence, unspecified, uncomplicated: Secondary | ICD-10-CM

## 2012-06-17 DIAGNOSIS — Z720 Tobacco use: Secondary | ICD-10-CM

## 2012-06-17 DIAGNOSIS — I4891 Unspecified atrial fibrillation: Secondary | ICD-10-CM

## 2012-06-17 LAB — BASIC METABOLIC PANEL
BUN: 16 mg/dL (ref 6–23)
Creatinine, Ser: 1.1 mg/dL (ref 0.4–1.5)
GFR: 72.72 mL/min (ref >60.00)
Glucose, Bld: 102 mg/dL — ABNORMAL HIGH (ref 70–99)

## 2012-06-17 LAB — CBC WITH DIFFERENTIAL/PLATELET
Basophils Relative: 0.5 % (ref 0.0–3.0)
Eosinophils Absolute: 0.1 10*3/uL (ref 0.0–0.7)
Eosinophils Relative: 1.5 % (ref 0.0–5.0)
HCT: 49 % (ref 39.0–52.0)
Hemoglobin: 16.2 g/dL (ref 13.0–17.0)
Lymphs Abs: 2.4 10*3/uL (ref 0.7–4.0)
MCHC: 33.1 g/dL (ref 30.0–36.0)
MCV: 95.4 fl (ref 78.0–100.0)
Monocytes Absolute: 0.7 10*3/uL (ref 0.1–1.0)
Neutro Abs: 6.8 10*3/uL (ref 1.4–7.7)
RBC: 5.14 Mil/uL (ref 4.22–5.81)
WBC: 10.1 10*3/uL (ref 4.5–10.5)

## 2012-06-17 MED ORDER — PRAVASTATIN SODIUM 20 MG PO TABS: 20.0000 mg | ORAL_TABLET | Freq: Every day | ORAL | Status: DC

## 2012-06-17 MED ORDER — WARFARIN SODIUM 5 MG PO TABS: ORAL_TABLET | ORAL | Status: DC

## 2012-06-17 MED ORDER — AMLODIPINE BESYLATE 5 MG PO TABS: 5.0000 mg | ORAL_TABLET | Freq: Every day | ORAL | Status: DC

## 2012-06-17 NOTE — Telephone Encounter (Signed)
Pt rtn William Stanley call [phone]

## 2012-06-17 NOTE — Patient Instructions (Addendum)
Your physician wants you to follow-up in: ONE YEAR WITH DR Shelda Pal will receive a reminder letter in the mail two months in advance. If you don't receive a letter, please call our office to schedule the follow-up appointment.   XARELTO OR PRADAXA OR ELIQUIS TO REPLACE COUMADIN

## 2012-06-17 NOTE — Assessment & Plan Note (Signed)
Patient remains in atrial fibrillation. His rate is controlled on no AV nodal blocking agents. Continue Coumadin. We have given him the name of the new anticoagulants today. He will check with his insurance company to see if one is covered. Check CBC and renal function. He will contact us if he would like to change to a new anticoagulant.

## 2012-06-17 NOTE — Assessment & Plan Note (Signed)
Patient counseled on discontinuing. 

## 2012-06-17 NOTE — Assessment & Plan Note (Signed)
Blood pressure controlled. Continue present medications. Check potassium and renal function. 

## 2012-06-17 NOTE — Telephone Encounter (Signed)
Spoke with pt, aware of lab results. 

## 2012-06-17 NOTE — Progress Notes (Signed)
   HPI: Pleasant male for fu of atrial fibrillation. He also has a history of DVT and pulmonary emboli in 1988. Last echocardiogram in December of 2010 showed normal LV function. There was mild left ventricular hypertrophy. There was mild mitral regurgitation. Myoview in December of 2010 showed normal perfusion and normal LV function with an ejection fraction of 56%. Holter monitor in June of 2013 showed controlled atrial fibrillation. Since last him in may of 2013, the patient has dyspnea with more extreme activities but not with routine activities. It is relieved with rest. It is not associated with chest pain. There is no orthopnea, PND. There is no syncope or palpitations. There is no exertional chest pain. Minimal pedal edema.    Current Outpatient Prescriptions  Medication Sig Dispense Refill  . amLODipine (NORVASC) 5 MG tablet Take 1 tablet (5 mg total) by mouth daily.  180 tablet  3  . pravastatin (PRAVACHOL) 20 MG tablet TAKE 1 TABLET EVERY DAY  30 tablet  1  . VENTOLIN HFA 108 (90 BASE) MCG/ACT inhaler Inhale 1-2 puffs into the lungs as needed.      . warfarin (COUMADIN) 5 MG tablet Take as directed by coumadin clinic  135 tablet  0     Past Medical History  Diagnosis Date  . Edema of lower extremity   . Hypertension   . Lung collapse     LEFT LUNG, TWICE  . DVT (deep venous thrombosis)   . Pulmonary embolism   . Atrial fibrillation     Past Surgical History  Procedure Date  . Venous ligation surgery 1988  . Hernia repair 1992    History   Social History  . Marital Status: Married    Spouse Name: N/A    Number of Children: N/A  . Years of Education: N/A   Occupational History  . Not on file.   Social History Main Topics  . Smoking status: Current Every Day Smoker -- 0.2 packs/day    Types: Cigarettes  . Smokeless tobacco: Never Used     Comment: pk a week  . Alcohol Use: No  . Drug Use: No  . Sexually Active: Not on file   Other Topics Concern  . Not on  file   Social History Narrative  . No narrative on file    ROS: no fevers or chills, productive cough, hemoptysis, dysphasia, odynophagia, melena, hematochezia, dysuria, hematuria, rash, seizure activity, orthopnea, PND, pedal edema, claudication. Remaining systems are negative.  Physical Exam: Well-developed well-nourished in no acute distress.  Skin is warm and dry.  HEENT is normal.  Neck is supple.  Chest is clear to auscultation with normal expansion.  Cardiovascular exam is irregular Abdominal exam nontender or distended. No masses palpated. Extremities show trace edema. neuro grossly intact  ECG atrial fibrillation at a rate of 68. Nonspecific ST changes.

## 2012-06-17 NOTE — Assessment & Plan Note (Signed)
Continue statin. 

## 2012-06-25 ENCOUNTER — Other Ambulatory Visit: Payer: Self-pay | Admitting: Cardiology

## 2012-07-29 ENCOUNTER — Ambulatory Visit (INDEPENDENT_AMBULATORY_CARE_PROVIDER_SITE_OTHER): Payer: Medicare Other | Admitting: *Deleted

## 2012-07-29 DIAGNOSIS — I4891 Unspecified atrial fibrillation: Secondary | ICD-10-CM

## 2012-07-29 DIAGNOSIS — Z7901 Long term (current) use of anticoagulants: Secondary | ICD-10-CM

## 2012-07-29 LAB — POCT INR: INR: 2.1

## 2012-09-09 ENCOUNTER — Ambulatory Visit (INDEPENDENT_AMBULATORY_CARE_PROVIDER_SITE_OTHER): Payer: Medicare Other | Admitting: *Deleted

## 2012-09-09 DIAGNOSIS — I4891 Unspecified atrial fibrillation: Secondary | ICD-10-CM

## 2012-09-09 DIAGNOSIS — Z7901 Long term (current) use of anticoagulants: Secondary | ICD-10-CM

## 2012-09-09 LAB — POCT INR: INR: 1.9

## 2012-10-02 ENCOUNTER — Encounter (HOSPITAL_BASED_OUTPATIENT_CLINIC_OR_DEPARTMENT_OTHER): Payer: Medicare Other | Attending: General Surgery

## 2012-10-02 DIAGNOSIS — L97809 Non-pressure chronic ulcer of other part of unspecified lower leg with unspecified severity: Secondary | ICD-10-CM | POA: Insufficient documentation

## 2012-10-02 DIAGNOSIS — Z86718 Personal history of other venous thrombosis and embolism: Secondary | ICD-10-CM | POA: Insufficient documentation

## 2012-10-02 DIAGNOSIS — I10 Essential (primary) hypertension: Secondary | ICD-10-CM | POA: Insufficient documentation

## 2012-10-02 DIAGNOSIS — I872 Venous insufficiency (chronic) (peripheral): Secondary | ICD-10-CM | POA: Insufficient documentation

## 2012-10-02 DIAGNOSIS — Z86711 Personal history of pulmonary embolism: Secondary | ICD-10-CM | POA: Insufficient documentation

## 2012-10-02 DIAGNOSIS — I4891 Unspecified atrial fibrillation: Secondary | ICD-10-CM | POA: Insufficient documentation

## 2012-10-02 DIAGNOSIS — I2510 Atherosclerotic heart disease of native coronary artery without angina pectoris: Secondary | ICD-10-CM | POA: Insufficient documentation

## 2012-10-03 NOTE — Progress Notes (Signed)
[facility]  NAME:  William Stanley, William Stanley                    ACCOUNT NO.:  MEDICAL RECORD NO.:  [id number]            DATE OF BIRTH:  PHYSICIAN:  Ardath Sax, M.D.           VISIT DATE:                                  OFFICE VISIT   This is a 67 year old gentleman who has a history of traumatizing the anterior aspect of his left leg and he has an ulcer on this leg that looks like a typical venous stasis ulcer, it is about a cm and a half in diameter.  He has many medical problems including deep thrombophlebitis. He even had pulmonary embolus.  He also has AFib and he is on Coumadin. He is also taking Norvasc, Pravachol, Ventolin, and the Coumadin that he takes 5 mg a day.  He is allergic to PENICILLIN.  His blood pressure was 156/91, respirations 18, pulse 84, temperature 97.  He weighs 210 pounds.  I examined this wound and decided to treat it as a venous stasis ulcer. I put some silver alginate on him and wrapped him in Profore Lite compression dressings and we put some zinc oxide around the ulcers and we will see him back in a week.  His diagnosis is venous stasis ulcer, left leg, also history of coronary artery disease and deep venous thrombosis and a pulmonary embolus and also has diagnosis as with AFib and hypertension.     Ardath Sax, M.D.     PP/MEDQ  D:  10/02/2012  T:  10/03/2012  Job:  [id number]

## 2012-10-07 ENCOUNTER — Ambulatory Visit (INDEPENDENT_AMBULATORY_CARE_PROVIDER_SITE_OTHER): Payer: Medicare Other | Admitting: *Deleted

## 2012-10-07 DIAGNOSIS — Z7901 Long term (current) use of anticoagulants: Secondary | ICD-10-CM

## 2012-10-07 DIAGNOSIS — I4891 Unspecified atrial fibrillation: Secondary | ICD-10-CM

## 2012-10-07 LAB — POCT INR: INR: 2.5

## 2012-10-09 ENCOUNTER — Encounter (HOSPITAL_BASED_OUTPATIENT_CLINIC_OR_DEPARTMENT_OTHER): Payer: Medicare Other | Attending: General Surgery

## 2012-10-09 DIAGNOSIS — L97909 Non-pressure chronic ulcer of unspecified part of unspecified lower leg with unspecified severity: Secondary | ICD-10-CM | POA: Insufficient documentation

## 2012-10-09 DIAGNOSIS — Z86718 Personal history of other venous thrombosis and embolism: Secondary | ICD-10-CM | POA: Insufficient documentation

## 2012-10-28 ENCOUNTER — Other Ambulatory Visit: Payer: Self-pay | Admitting: Cardiology

## 2012-11-06 ENCOUNTER — Encounter (HOSPITAL_BASED_OUTPATIENT_CLINIC_OR_DEPARTMENT_OTHER): Payer: Medicare Other | Attending: General Surgery

## 2012-11-06 DIAGNOSIS — L97909 Non-pressure chronic ulcer of unspecified part of unspecified lower leg with unspecified severity: Secondary | ICD-10-CM | POA: Insufficient documentation

## 2012-11-06 DIAGNOSIS — I87319 Chronic venous hypertension (idiopathic) with ulcer of unspecified lower extremity: Secondary | ICD-10-CM | POA: Insufficient documentation

## 2012-11-11 ENCOUNTER — Ambulatory Visit (INDEPENDENT_AMBULATORY_CARE_PROVIDER_SITE_OTHER): Payer: Medicare Other | Admitting: *Deleted

## 2012-11-11 DIAGNOSIS — I4891 Unspecified atrial fibrillation: Secondary | ICD-10-CM

## 2012-11-11 DIAGNOSIS — Z7901 Long term (current) use of anticoagulants: Secondary | ICD-10-CM

## 2012-11-11 LAB — POCT INR: INR: 2.4

## 2012-12-03 ENCOUNTER — Encounter: Payer: Self-pay | Admitting: Cardiology

## 2012-12-11 ENCOUNTER — Encounter (HOSPITAL_BASED_OUTPATIENT_CLINIC_OR_DEPARTMENT_OTHER): Payer: Medicare Other | Attending: General Surgery

## 2012-12-11 DIAGNOSIS — I872 Venous insufficiency (chronic) (peripheral): Secondary | ICD-10-CM | POA: Insufficient documentation

## 2012-12-11 DIAGNOSIS — L97809 Non-pressure chronic ulcer of other part of unspecified lower leg with unspecified severity: Secondary | ICD-10-CM | POA: Insufficient documentation

## 2012-12-12 ENCOUNTER — Ambulatory Visit: Payer: Medicare Other | Admitting: Internal Medicine

## 2012-12-16 ENCOUNTER — Ambulatory Visit (INDEPENDENT_AMBULATORY_CARE_PROVIDER_SITE_OTHER): Payer: Medicare Other

## 2012-12-16 DIAGNOSIS — I4891 Unspecified atrial fibrillation: Secondary | ICD-10-CM

## 2012-12-16 DIAGNOSIS — Z7901 Long term (current) use of anticoagulants: Secondary | ICD-10-CM

## 2012-12-24 ENCOUNTER — Ambulatory Visit: Payer: Medicare Other | Admitting: Internal Medicine

## 2012-12-31 ENCOUNTER — Ambulatory Visit: Payer: Medicare Other | Admitting: Internal Medicine

## 2013-01-08 ENCOUNTER — Encounter (HOSPITAL_BASED_OUTPATIENT_CLINIC_OR_DEPARTMENT_OTHER): Payer: Medicare Other | Attending: General Surgery

## 2013-01-08 DIAGNOSIS — L97909 Non-pressure chronic ulcer of unspecified part of unspecified lower leg with unspecified severity: Secondary | ICD-10-CM | POA: Insufficient documentation

## 2013-01-08 DIAGNOSIS — B965 Pseudomonas (aeruginosa) (mallei) (pseudomallei) as the cause of diseases classified elsewhere: Secondary | ICD-10-CM | POA: Insufficient documentation

## 2013-01-08 DIAGNOSIS — A4902 Methicillin resistant Staphylococcus aureus infection, unspecified site: Secondary | ICD-10-CM | POA: Insufficient documentation

## 2013-01-08 DIAGNOSIS — Z7901 Long term (current) use of anticoagulants: Secondary | ICD-10-CM | POA: Insufficient documentation

## 2013-01-08 DIAGNOSIS — I4891 Unspecified atrial fibrillation: Secondary | ICD-10-CM | POA: Insufficient documentation

## 2013-01-08 NOTE — Progress Notes (Signed)
[facility]  NAME:  William Stanley, William Stanley              ACCOUNT NO.:  [id number]  MEDICAL RECORD NO.:  [id number]      DATE OF BIRTH:  1945/11/27  PHYSICIAN:  Wayland Denis, DO            VISIT DATE:                                  OFFICE VISIT   The patient is a 67 year old gentleman, who is here for followup on his left lower extremity chronic venous insufficiency ulcer.  He has had vascular workup and is not a candidate for intervention.  He has been dealing with this on and off for number of years.  It seems to heal and then breakdown again.  Application has been submitted and approved for Apligraf.  PAST MEDICAL HISTORY:  Positive for hypertension, lung collapse, DVT, pulmonary embolism, atrial fibrillation, and now MRSA.  He has undergone hernia repair, venous ligation and vena cava filter in the past.  MEDICATIONS:  Include Norvasc, Pravachol, Ventolin, and Coumadin.  ALLERGIES:  He is allergic to aspirin.  SOCIAL HISTORY:  Lives at home.  Denies tobacco use.  Also states he is not eating healthy, but he is willing to have of prealbumin checked which was ordered today.  Infectious Disease consulted later this week since he has multi-organism contamination and infection with MRSA and Pseudomonas.  He is on doxycycline.  PHYSICAL EXAMINATION:  On exam, he is alert, oriented, cooperative, not in any acute distress.  He is pleasant.  Pupils are equal.  Extraocular muscles are intact.  No cervical lymphadenopathy.  His breathing is unlabored.  His heart is regular.  Abdomen is soft.  The wound was debrided and Apligraf was applied and the notes are listed.  We will also check prealbumin.     Wayland Denis, DO     CS/MEDQ  D:  01/08/2013  T:  01/08/2013  Job:  [id number]

## 2013-01-14 ENCOUNTER — Ambulatory Visit (INDEPENDENT_AMBULATORY_CARE_PROVIDER_SITE_OTHER): Payer: Medicare Other | Admitting: Internal Medicine

## 2013-01-14 ENCOUNTER — Encounter: Payer: Self-pay | Admitting: Internal Medicine

## 2013-01-14 VITALS — BP 137/90 | HR 89 | Temp 97.7°F | Ht 68.0 in | Wt 232.0 lb

## 2013-01-14 DIAGNOSIS — IMO0001 Reserved for inherently not codable concepts without codable children: Secondary | ICD-10-CM

## 2013-01-14 DIAGNOSIS — L97909 Non-pressure chronic ulcer of unspecified part of unspecified lower leg with unspecified severity: Secondary | ICD-10-CM

## 2013-01-14 DIAGNOSIS — I83009 Varicose veins of unspecified lower extremity with ulcer of unspecified site: Secondary | ICD-10-CM | POA: Insufficient documentation

## 2013-01-14 NOTE — Progress Notes (Signed)
  Subjective:    Patient ID: William Stanley, male    DOB: 1945-08-31, 67 y.o.   MRN: [id number]  HPI He comes in for evaluation as a new patient. He was sent here from the wound care center, Dr. Jimmey Ralph, for evaluation of a wound ulcer. According to the referral, he was sent to consider therapy for Pseudomonas for his foot ulcer.  In reviewing his notes, I have not encountered in the culture but according to the patient he had a wound swab that was positive for what he thinks was Pseudomonas. He does not know the sensitivity pattern. He was going to be started on what sounds like Levaquin, however with his Coumadin and the interaction, he was sent here for consideration of alternative therapy for Pseudomonas. In talking with him however, it does not sound like he has any surrounding cellulitis, no abscess collection and is unclear of the significance of the wound swab, if it was from plus or if it was simply a swab of the otherwise healing ulcer. Unfortunately, the patient refuses exam of his foot and has invited me to come to the wound care center if I want to look at it at his next appointment. He is concerned of the grafts that have been placed. He denies any fever or chills.   Review of Systems  Constitutional: Negative for fever and chills.  Gastrointestinal: Negative for nausea and diarrhea.  Skin: Positive for wound. Negative for rash.  Hematological: Negative for adenopathy.       Objective:   Physical Exam  Constitutional: He appears well-developed and well-nourished. No distress.  Cardiovascular: Normal rate, regular rhythm and normal heart sounds.   No murmur heard. Musculoskeletal:  Refused exam of his foot          Assessment & Plan:

## 2013-01-14 NOTE — Assessment & Plan Note (Signed)
It does not sound like he has any active infection. It is certainly hard to tell in this patient who refuses an exam. It sounds like the Pseudomonas culture was from a wound and so significance is unknown. Since I am unable to evaluate any active infection, and by history does not appear to have any infection, I have recommended he does not take antibiotics.  I have discussed this with the patient.  Thank you for  The consultation and please call with any questions

## 2013-01-16 NOTE — Progress Notes (Signed)
[facility]  NAME:  William Stanley, William Stanley              ACCOUNT NO.:  [id number]  MEDICAL RECORD NO.:  [id number]      DATE OF BIRTH:  08-24-1945  PHYSICIAN:  Ardath Sax, M.D.           VISIT DATE:                                  OFFICE VISIT   Dear Dr. Daiva Eves and Dr. Ninetta Lights, please see Mr. William Stanley as a consult from the [facility].  William Stanley is a 67 year old gentleman, who has venous hypertension and chronic venous ulcers and he also has atrial fibrillation and is on Coumadin.  These venous ulcers were cultured and grew out both MRSA and Pseudomonas.  I put him on doxycycline to treat the MRSA, but the patient is unable to take Cipro because of the fact that he is on Coumadin.  Therefore, we are asking you to see this patient to see if you have any ideas as the best way to treat this gentleman with the infections that he chronically has on these venous stasis ulcers.     Ardath Sax, M.D.     PP/MEDQ  D:  01/15/2013  T:  01/16/2013  Job:  [id number]

## 2013-01-27 ENCOUNTER — Ambulatory Visit (INDEPENDENT_AMBULATORY_CARE_PROVIDER_SITE_OTHER): Payer: Medicare Other | Admitting: *Deleted

## 2013-01-27 ENCOUNTER — Telehealth: Payer: Self-pay | Admitting: Physician Assistant

## 2013-01-27 DIAGNOSIS — I4891 Unspecified atrial fibrillation: Secondary | ICD-10-CM

## 2013-01-27 DIAGNOSIS — Z7901 Long term (current) use of anticoagulants: Secondary | ICD-10-CM

## 2013-01-27 MED ORDER — ALBUTEROL SULFATE HFA 108 (90 BASE) MCG/ACT IN AERS: 1.0000 | INHALATION_SPRAY | RESPIRATORY_TRACT | Status: DC | PRN

## 2013-01-27 NOTE — Telephone Encounter (Signed)
Medication refilled per protocol. 

## 2013-02-03 ENCOUNTER — Ambulatory Visit (INDEPENDENT_AMBULATORY_CARE_PROVIDER_SITE_OTHER): Payer: Medicare Other | Admitting: *Deleted

## 2013-02-03 DIAGNOSIS — I4891 Unspecified atrial fibrillation: Secondary | ICD-10-CM

## 2013-02-03 DIAGNOSIS — Z7901 Long term (current) use of anticoagulants: Secondary | ICD-10-CM

## 2013-02-05 ENCOUNTER — Encounter (HOSPITAL_BASED_OUTPATIENT_CLINIC_OR_DEPARTMENT_OTHER): Payer: Medicare Other | Attending: General Surgery

## 2013-02-05 DIAGNOSIS — L97909 Non-pressure chronic ulcer of unspecified part of unspecified lower leg with unspecified severity: Secondary | ICD-10-CM | POA: Insufficient documentation

## 2013-02-19 ENCOUNTER — Ambulatory Visit (INDEPENDENT_AMBULATORY_CARE_PROVIDER_SITE_OTHER): Payer: Medicare Other | Admitting: *Deleted

## 2013-02-19 DIAGNOSIS — I4891 Unspecified atrial fibrillation: Secondary | ICD-10-CM

## 2013-02-19 DIAGNOSIS — Z7901 Long term (current) use of anticoagulants: Secondary | ICD-10-CM

## 2013-03-03 ENCOUNTER — Ambulatory Visit (INDEPENDENT_AMBULATORY_CARE_PROVIDER_SITE_OTHER): Payer: Medicare Other | Admitting: *Deleted

## 2013-03-03 DIAGNOSIS — I4891 Unspecified atrial fibrillation: Secondary | ICD-10-CM

## 2013-03-03 DIAGNOSIS — Z7901 Long term (current) use of anticoagulants: Secondary | ICD-10-CM

## 2013-03-12 ENCOUNTER — Encounter (HOSPITAL_BASED_OUTPATIENT_CLINIC_OR_DEPARTMENT_OTHER): Payer: Medicare Other | Attending: General Surgery

## 2013-03-12 DIAGNOSIS — I872 Venous insufficiency (chronic) (peripheral): Secondary | ICD-10-CM | POA: Insufficient documentation

## 2013-03-12 DIAGNOSIS — L97809 Non-pressure chronic ulcer of other part of unspecified lower leg with unspecified severity: Secondary | ICD-10-CM | POA: Insufficient documentation

## 2013-03-20 NOTE — Progress Notes (Signed)
[facility]  NAME:  STEED, KANAAN              ACCOUNT NO.:  [id number]  MEDICAL RECORD NO.:  [id number]      DATE OF BIRTH:  08/10/1945  PHYSICIAN:  Ardath Sax, M.D.           VISIT DATE:                                  OFFICE VISIT   William Stanley is a 67 year old gentleman who I have been taking care of for the past several months.  He has traumatized the anterior aspect of his left leg on a fall and he has some venous hypertension and because of this, this wound has developed into a chronic venous stasis ulcer, is about a cm now and it was at one time, 3 cm and we treated him with Apligraf and it is really taken off.  At the present time, the wound is less than 1 cm, probably about 8 mm in diameter, and it is very clean, granulation tissue.  We would like to have permission to do another run of Apligraf, and I feel that will heal this ulcer up for this gentleman.     Ardath Sax, M.D.     PP/MEDQ  D:  03/19/2013  T:  03/20/2013  Job:  [id number]

## 2013-03-31 ENCOUNTER — Ambulatory Visit (INDEPENDENT_AMBULATORY_CARE_PROVIDER_SITE_OTHER): Payer: Medicare Other | Admitting: *Deleted

## 2013-03-31 DIAGNOSIS — I4891 Unspecified atrial fibrillation: Secondary | ICD-10-CM

## 2013-03-31 DIAGNOSIS — Z7901 Long term (current) use of anticoagulants: Secondary | ICD-10-CM

## 2013-04-09 ENCOUNTER — Encounter (HOSPITAL_BASED_OUTPATIENT_CLINIC_OR_DEPARTMENT_OTHER): Payer: Medicare Other | Attending: General Surgery

## 2013-04-09 DIAGNOSIS — L97809 Non-pressure chronic ulcer of other part of unspecified lower leg with unspecified severity: Secondary | ICD-10-CM | POA: Insufficient documentation

## 2013-04-09 DIAGNOSIS — I87309 Chronic venous hypertension (idiopathic) without complications of unspecified lower extremity: Secondary | ICD-10-CM | POA: Insufficient documentation

## 2013-04-09 DIAGNOSIS — I872 Venous insufficiency (chronic) (peripheral): Secondary | ICD-10-CM | POA: Insufficient documentation

## 2013-05-05 ENCOUNTER — Ambulatory Visit (INDEPENDENT_AMBULATORY_CARE_PROVIDER_SITE_OTHER): Payer: Medicare Other | Admitting: *Deleted

## 2013-05-05 DIAGNOSIS — Z7901 Long term (current) use of anticoagulants: Secondary | ICD-10-CM

## 2013-05-05 DIAGNOSIS — I4891 Unspecified atrial fibrillation: Secondary | ICD-10-CM

## 2013-05-05 MED ORDER — WARFARIN SODIUM 5 MG PO TABS: 5.0000 mg | ORAL_TABLET | ORAL | Status: DC

## 2013-05-07 ENCOUNTER — Encounter (HOSPITAL_BASED_OUTPATIENT_CLINIC_OR_DEPARTMENT_OTHER): Payer: Medicare Other | Attending: General Surgery

## 2013-05-07 DIAGNOSIS — L97909 Non-pressure chronic ulcer of unspecified part of unspecified lower leg with unspecified severity: Secondary | ICD-10-CM | POA: Insufficient documentation

## 2013-05-07 DIAGNOSIS — I87319 Chronic venous hypertension (idiopathic) with ulcer of unspecified lower extremity: Secondary | ICD-10-CM | POA: Diagnosis present

## 2013-05-14 DIAGNOSIS — I87319 Chronic venous hypertension (idiopathic) with ulcer of unspecified lower extremity: Secondary | ICD-10-CM | POA: Diagnosis not present

## 2013-05-21 DIAGNOSIS — I87319 Chronic venous hypertension (idiopathic) with ulcer of unspecified lower extremity: Secondary | ICD-10-CM | POA: Diagnosis not present

## 2013-05-28 DIAGNOSIS — I87319 Chronic venous hypertension (idiopathic) with ulcer of unspecified lower extremity: Secondary | ICD-10-CM | POA: Diagnosis not present

## 2013-06-04 DIAGNOSIS — I87319 Chronic venous hypertension (idiopathic) with ulcer of unspecified lower extremity: Secondary | ICD-10-CM | POA: Diagnosis not present

## 2013-06-11 ENCOUNTER — Encounter (HOSPITAL_BASED_OUTPATIENT_CLINIC_OR_DEPARTMENT_OTHER): Payer: Medicare Other | Attending: General Surgery

## 2013-06-11 DIAGNOSIS — I87319 Chronic venous hypertension (idiopathic) with ulcer of unspecified lower extremity: Secondary | ICD-10-CM | POA: Insufficient documentation

## 2013-06-11 DIAGNOSIS — L97909 Non-pressure chronic ulcer of unspecified part of unspecified lower leg with unspecified severity: Secondary | ICD-10-CM | POA: Insufficient documentation

## 2013-06-16 ENCOUNTER — Encounter: Payer: Self-pay | Admitting: Cardiology

## 2013-06-16 ENCOUNTER — Ambulatory Visit (INDEPENDENT_AMBULATORY_CARE_PROVIDER_SITE_OTHER): Payer: Medicare Other | Admitting: Cardiology

## 2013-06-16 ENCOUNTER — Ambulatory Visit (INDEPENDENT_AMBULATORY_CARE_PROVIDER_SITE_OTHER): Payer: Medicare Other | Admitting: *Deleted

## 2013-06-16 VITALS — BP 136/80 | HR 74 | Wt 232.0 lb

## 2013-06-16 DIAGNOSIS — I10 Essential (primary) hypertension: Secondary | ICD-10-CM

## 2013-06-16 DIAGNOSIS — I4891 Unspecified atrial fibrillation: Secondary | ICD-10-CM

## 2013-06-16 DIAGNOSIS — R0989 Other specified symptoms and signs involving the circulatory and respiratory systems: Secondary | ICD-10-CM | POA: Insufficient documentation

## 2013-06-16 DIAGNOSIS — Z7901 Long term (current) use of anticoagulants: Secondary | ICD-10-CM

## 2013-06-16 LAB — POCT INR: INR: 1.9

## 2013-06-16 LAB — CBC WITH DIFFERENTIAL/PLATELET
Eosinophils Relative: 1 % (ref 0.0–5.0)
HCT: 46.3 % (ref 39.0–52.0)
Hemoglobin: 15.6 g/dL (ref 13.0–17.0)
Lymphs Abs: 2.1 10*3/uL (ref 0.7–4.0)
Monocytes Relative: 6.2 % (ref 3.0–12.0)
Neutro Abs: 10.8 10*3/uL — ABNORMAL HIGH (ref 1.4–7.7)
RBC: 4.92 Mil/uL (ref 4.22–5.81)
WBC: 13.9 10*3/uL — ABNORMAL HIGH (ref 4.5–10.5)

## 2013-06-16 NOTE — Assessment & Plan Note (Signed)
Rate is controlled on no medications. Continue Coumadin. He declines new oral anticoagulant agents. Check hemoglobin.

## 2013-06-16 NOTE — Assessment & Plan Note (Signed)
Check ultrasound to exclude aneurysm.

## 2013-06-16 NOTE — Progress Notes (Signed)
      HPI: FU atrial fibrillation. He also has a history of DVT and pulmonary emboli in 1988. Last echocardiogram in December of 2010 showed normal LV function. There was mild left ventricular hypertrophy. There was mild mitral regurgitation. Myoview in December of 2010 showed normal perfusion and normal LV function with an ejection fraction of 56%. Holter monitor in June of 2013 showed controlled atrial fibrillation. Since last him in Nov of 2013, the patient has dyspnea with more extreme activities but not with routine activities. It is relieved with rest. It is not associated with chest pain. There is no orthopnea, PND. There is no syncope or palpitations. There is no exertional chest pain. Minimal pedal edema.   Current Outpatient Prescriptions  Medication Sig Dispense Refill  . albuterol (VENTOLIN HFA) 108 (90 BASE) MCG/ACT inhaler Inhale 1-2 puffs into the lungs as needed.  1 Inhaler  prn  . amLODipine (NORVASC) 5 MG tablet Take 1 tablet (5 mg total) by mouth daily.  180 tablet  3  . doxycycline (DORYX) 100 MG EC tablet Take 100 mg by mouth 2 (two) times daily.      . pravastatin (PRAVACHOL) 20 MG tablet Take 1 tablet (20 mg total) by mouth daily.  90 tablet  4  . warfarin (COUMADIN) 5 MG tablet Take 1 tablet (5 mg total) by mouth as directed.  135 tablet  1   No current facility-administered medications for this visit.     Past Medical History  Diagnosis Date  . Edema of lower extremity   . Hypertension   . Lung collapse     LEFT LUNG, TWICE  . DVT (deep venous thrombosis)   . Pulmonary embolism   . Atrial fibrillation     Past Surgical History  Procedure Laterality Date  . Venous ligation surgery  1988  . Hernia repair  1992    History   Social History  . Marital Status: Married    Spouse Name: N/A    Number of Children: N/A  . Years of Education: N/A   Occupational History  . Not on file.   Social History Main Topics  . Smoking status: Former Smoker -- 0.25  packs/day    Types: Cigarettes    Quit date: 07/07/2012  . Smokeless tobacco: Never Used     Comment: pk a week  . Alcohol Use: No  . Drug Use: No  . Sexual Activity: Not on file   Other Topics Concern  . Not on file   Social History Narrative  . No narrative on file    ROS: no fevers or chills, productive cough, hemoptysis, dysphasia, odynophagia, melena, hematochezia, dysuria, hematuria, rash, seizure activity, orthopnea, PND, pedal edema, claudication. Remaining systems are negative.  Physical Exam: Well-developed well-nourished in no acute distress.  Skin is warm and dry.  HEENT is normal.  Neck is supple.  Chest is clear to auscultation with normal expansion.  Cardiovascular exam is irregular Abdominal exam nontender or distended. No masses palpated. Extremities show trace to 1+ edema. neuro grossly intact  ECG atrial fibrillation at a rate of 74. Nonspecific ST changes.

## 2013-06-16 NOTE — Assessment & Plan Note (Signed)
Continue statin.lipids and liver monitored by primary care. 

## 2013-06-16 NOTE — Assessment & Plan Note (Signed)
Continue present blood pressure medications. 

## 2013-06-16 NOTE — Patient Instructions (Addendum)
Your physician recommends that you have lab work today CBC  Please schedule Abnominal Ultrosound  Your physician recommends that you continue on your current medications as directed. Please refer to the Current Medication list given to you today.  Your physician wants you to follow-up in: 1 year with Dr. Shirley Muscat will receive a reminder letter in the mail two months in advance. If you don't receive a letter, please call our office to schedule the follow-up appointment.

## 2013-06-20 ENCOUNTER — Other Ambulatory Visit: Payer: Self-pay | Admitting: *Deleted

## 2013-06-20 DIAGNOSIS — D72829 Elevated white blood cell count, unspecified: Secondary | ICD-10-CM

## 2013-06-23 ENCOUNTER — Telehealth: Payer: Self-pay | Admitting: *Deleted

## 2013-06-23 NOTE — Telephone Encounter (Signed)
error 

## 2013-06-25 ENCOUNTER — Ambulatory Visit (INDEPENDENT_AMBULATORY_CARE_PROVIDER_SITE_OTHER): Payer: Medicare Other | Admitting: Physician Assistant

## 2013-06-25 ENCOUNTER — Encounter: Payer: Self-pay | Admitting: Physician Assistant

## 2013-06-25 VITALS — BP 132/82 | HR 72 | Temp 97.3°F | Resp 20 | Ht 68.0 in | Wt 230.0 lb

## 2013-06-25 DIAGNOSIS — J988 Other specified respiratory disorders: Secondary | ICD-10-CM

## 2013-06-25 DIAGNOSIS — A499 Bacterial infection, unspecified: Secondary | ICD-10-CM

## 2013-06-25 MED ORDER — AZITHROMYCIN 250 MG PO TABS: ORAL_TABLET | ORAL | Status: DC

## 2013-06-25 NOTE — Progress Notes (Signed)
   Patient ID: William Stanley MRN: [id number], DOB: 04-13-1946, 67 y.o. Date of Encounter: 06/25/2013, 2:19 PM    Chief Complaint:  Chief Complaint  Patient presents with  . Sinus infection and chest congestion, cough     HPI: 67 y.o. year old white male reports he has been sick for over one week. Says he has stayed in the house for 5 days. Feels congested in his nasal area but is not getting out any mucus. Says it is draining down his throat instead. He does have a cough and has been productive of phlegm. Had no significant sore throat or earache. No fevers or chills.     Home Meds: See attached medication section for any medications that were entered at today's visit. The computer does not put those onto this list.The following list is a list of meds entered prior to today's visit.   Current Outpatient Prescriptions on File Prior to Visit  Medication Sig Dispense Refill  . albuterol (VENTOLIN HFA) 108 (90 BASE) MCG/ACT inhaler Inhale 1-2 puffs into the lungs as needed.  1 Inhaler  prn  . amLODipine (NORVASC) 5 MG tablet Take 1 tablet (5 mg total) by mouth daily.  180 tablet  3  . doxycycline (DORYX) 100 MG EC tablet Take 100 mg by mouth 2 (two) times daily.      . pravastatin (PRAVACHOL) 20 MG tablet Take 1 tablet (20 mg total) by mouth daily.  90 tablet  4  . warfarin (COUMADIN) 5 MG tablet Take 1 tablet (5 mg total) by mouth as directed.  135 tablet  1   No current facility-administered medications on file prior to visit.    Allergies:  Allergies  Allergen Reactions  . Penicillins       Review of Systems: See HPI for pertinent ROS. All other ROS negative.    Physical Exam: Blood pressure 132/82, pulse 72, temperature 97.3 F (36.3 C), temperature source Oral, resp. rate 20, height 5\' 8"  (1.727 m), weight 230 lb (104.327 kg)., Body mass index is 34.98 kg/(m^2). General: WNWD WM.  Appears in no acute distress. HEENT: Normocephalic, atraumatic, eyes without discharge,  sclera non-icteric, nares are without discharge. Bilateral auditory canals clear, TM's are without perforation, pearly grey and translucent with reflective cone of light bilaterally. Oral cavity moist, posterior pharynx without exudate, erythema, peritonsillar abscess. No tenderness with percussion of sinuses.  Neck: Supple. No thyromegaly. No lymphadenopathy. Lungs: Clear bilaterally to auscultation without wheezes, rales, or rhonchi. Breathing is unlabored. Heart: Irregular rhythm.  Msk:  Strength and tone normal for age. Neuro: Alert and oriented X 3. Moves all extremities spontaneously. Gait is normal. CNII-XII grossly in tact. Psych:  Responds to questions appropriately with a normal affect.     ASSESSMENT AND PLAN:  67 y.o. year old male with  1. Bacterial respiratory infection - azithromycin (ZITHROMAX) 250 MG tablet; Day 1: Take 2 daily.  Days 2-5: take 1 daily.  Dispense: 6 tablet; Refill: 0 Complete the antibiotic. Recommend using Mucinex DM as expectorant. Followup if symptoms do not resolve after completion of antibiotic.   Murray Hodgkins [place], [place], [facility] 06/25/2013 2:19 PM

## 2013-06-30 ENCOUNTER — Encounter (HOSPITAL_COMMUNITY): Payer: Medicare Other

## 2013-07-01 ENCOUNTER — Other Ambulatory Visit: Payer: Self-pay | Admitting: Physician Assistant

## 2013-07-01 NOTE — Telephone Encounter (Signed)
z pak denied

## 2013-07-21 ENCOUNTER — Ambulatory Visit (INDEPENDENT_AMBULATORY_CARE_PROVIDER_SITE_OTHER): Payer: Medicare Other | Admitting: *Deleted

## 2013-07-21 DIAGNOSIS — Z7901 Long term (current) use of anticoagulants: Secondary | ICD-10-CM

## 2013-07-21 DIAGNOSIS — I4891 Unspecified atrial fibrillation: Secondary | ICD-10-CM

## 2013-08-19 ENCOUNTER — Other Ambulatory Visit: Payer: Self-pay | Admitting: Cardiology

## 2013-09-01 ENCOUNTER — Ambulatory Visit (INDEPENDENT_AMBULATORY_CARE_PROVIDER_SITE_OTHER): Payer: Medicare Other

## 2013-09-01 DIAGNOSIS — Z5181 Encounter for therapeutic drug level monitoring: Secondary | ICD-10-CM | POA: Insufficient documentation

## 2013-09-01 DIAGNOSIS — Z7901 Long term (current) use of anticoagulants: Secondary | ICD-10-CM

## 2013-09-01 DIAGNOSIS — I4891 Unspecified atrial fibrillation: Secondary | ICD-10-CM

## 2013-09-01 LAB — POCT INR: INR: 1.8

## 2013-10-13 ENCOUNTER — Ambulatory Visit (INDEPENDENT_AMBULATORY_CARE_PROVIDER_SITE_OTHER): Payer: Medicare Other | Admitting: *Deleted

## 2013-10-13 DIAGNOSIS — Z7901 Long term (current) use of anticoagulants: Secondary | ICD-10-CM

## 2013-10-13 DIAGNOSIS — Z5181 Encounter for therapeutic drug level monitoring: Secondary | ICD-10-CM

## 2013-10-13 DIAGNOSIS — I4891 Unspecified atrial fibrillation: Secondary | ICD-10-CM

## 2013-10-13 LAB — POCT INR: INR: 2.2

## 2013-11-18 ENCOUNTER — Other Ambulatory Visit: Payer: Self-pay | Admitting: *Deleted

## 2013-11-18 MED ORDER — WARFARIN SODIUM 5 MG PO TABS: ORAL_TABLET | ORAL | Status: DC

## 2013-11-24 ENCOUNTER — Ambulatory Visit (INDEPENDENT_AMBULATORY_CARE_PROVIDER_SITE_OTHER): Payer: Medicare Other | Admitting: *Deleted

## 2013-11-24 DIAGNOSIS — I4891 Unspecified atrial fibrillation: Secondary | ICD-10-CM

## 2013-11-24 DIAGNOSIS — Z7901 Long term (current) use of anticoagulants: Secondary | ICD-10-CM

## 2013-11-24 DIAGNOSIS — Z5181 Encounter for therapeutic drug level monitoring: Secondary | ICD-10-CM

## 2013-11-24 LAB — POCT INR: INR: 2.2

## 2014-01-05 ENCOUNTER — Ambulatory Visit (INDEPENDENT_AMBULATORY_CARE_PROVIDER_SITE_OTHER): Payer: Medicare Other | Admitting: *Deleted

## 2014-01-05 DIAGNOSIS — Z5181 Encounter for therapeutic drug level monitoring: Secondary | ICD-10-CM

## 2014-01-05 DIAGNOSIS — Z7901 Long term (current) use of anticoagulants: Secondary | ICD-10-CM

## 2014-01-05 DIAGNOSIS — I4891 Unspecified atrial fibrillation: Secondary | ICD-10-CM

## 2014-01-05 LAB — POCT INR: INR: 2.6

## 2014-01-30 ENCOUNTER — Other Ambulatory Visit: Payer: Self-pay | Admitting: Physician Assistant

## 2014-01-30 NOTE — Telephone Encounter (Signed)
Medication refilled per protocol. 

## 2014-02-24 ENCOUNTER — Other Ambulatory Visit: Payer: Self-pay | Admitting: *Deleted

## 2014-02-24 MED ORDER — PRAVASTATIN SODIUM 20 MG PO TABS: 20.0000 mg | ORAL_TABLET | Freq: Every day | ORAL | Status: DC

## 2014-04-20 ENCOUNTER — Encounter: Payer: Self-pay | Admitting: Physician Assistant

## 2014-04-20 ENCOUNTER — Ambulatory Visit (INDEPENDENT_AMBULATORY_CARE_PROVIDER_SITE_OTHER): Payer: Medicare Other | Admitting: Physician Assistant

## 2014-04-20 VITALS — BP 112/70 | HR 68 | Temp 97.5°F | Ht 68.0 in | Wt 231.0 lb

## 2014-04-20 DIAGNOSIS — J988 Other specified respiratory disorders: Secondary | ICD-10-CM

## 2014-04-20 DIAGNOSIS — H6502 Acute serous otitis media, left ear: Secondary | ICD-10-CM

## 2014-04-20 DIAGNOSIS — A499 Bacterial infection, unspecified: Secondary | ICD-10-CM

## 2014-04-20 DIAGNOSIS — B9689 Other specified bacterial agents as the cause of diseases classified elsewhere: Secondary | ICD-10-CM

## 2014-04-20 DIAGNOSIS — H6500 Acute serous otitis media, unspecified ear: Secondary | ICD-10-CM

## 2014-04-20 MED ORDER — LEVOFLOXACIN 750 MG PO TABS: 750.0000 mg | ORAL_TABLET | Freq: Every day | ORAL | Status: DC

## 2014-04-20 NOTE — Progress Notes (Signed)
Patient ID: William Stanley MRN: [id number], DOB: May 05, 1946, 68 y.o. Date of Encounter: 04/20/2014, 12:12 PM    Chief Complaint:  Chief Complaint  Patient presents with  . URI    per paitent-  has been going on since Friday, left side face pain.     HPI: 68 y.o. year old white male says he has had head/nasal congestion with thick mucus as well as chest congestion for over a week now. However says that Saturday-- 04/18/14-- he suddenly also developed pain in his left ear and cheek area just in front of the left ear. Has had no fevers or chills. No sore throat.     Home Meds:   Outpatient Prescriptions Prior to Visit  Medication Sig Dispense Refill  . amLODipine (NORVASC) 5 MG tablet TAKE 1 TABLET BY MOUTH DAILY  180 tablet  1  . pravastatin (PRAVACHOL) 20 MG tablet Take 1 tablet (20 mg total) by mouth daily.  90 tablet  0  . VENTOLIN HFA 108 (90 BASE) MCG/ACT inhaler INHALE 1-2 PUFFS INTO THE LUNGS AS NEEDED.  18 each  10  . warfarin (COUMADIN) 5 MG tablet 1.5 tablets daily except 1 tablets on Mondays, Wednesdays and Fridays. OR AS DIRECTED BY COUMADIN CLINIC  135 tablet  1  . doxycycline (DORYX) 100 MG EC tablet Take 100 mg by mouth 2 (two) times daily.      .        No facility-administered medications prior to visit.    Allergies:  Allergies  Allergen Reactions  . Penicillins       Review of Systems: See HPI for pertinent ROS. All other ROS negative.    Physical Exam: Blood pressure 112/70, pulse 68, temperature 97.5 F (36.4 C), temperature source Oral, height 5\' 8"  (1.727 m), weight 231 lb (104.781 kg)., Body mass index is 35.13 kg/(m^2). General:  Overweight WM. Appears in no acute distress. HEENT: Normocephalic, atraumatic, eyes without discharge, sclera non-icteric, nares are without discharge. Bilateral auditory canals clear. Right TM is normal. Left TM is deep red erythema. Left cheek, just anterior to left ear, is slightly swollen and tender.     Oral  cavity moist, posterior pharynx without exudate, erythema, peritonsillar abscess, or post nasal drip.  Neck: Supple. No thyromegaly. No lymphadenopathy. I palpate no enlarged lymph nodes and he reports that the left neck is no more tender than the right neck. Lungs: Clear bilaterally to auscultation without wheezes, rales, or rhonchi. Breathing is unlabored. Heart: Irregular rhythm. Msk:  Strength and tone normal for age. Extremities/Skin: Warm and dry.  No rashes. Neuro: Alert and oriented X 3. Moves all extremities spontaneously. Gait is normal. CNII-XII grossly in tact. Psych:  Responds to questions appropriately with a normal affect.     ASSESSMENT AND PLAN:  68 y.o. year old male with  1. Acute serous otitis media of left ear, recurrence not specified He is allergic to penicillin so will have to avoid Augmentin and Omnicef. I am concerned about possibly developing mastoiditis. He is to go to the pharmacy and get the Levaquin and start it immediately. He is to schedule followup office visit with me on Thursday 04/23/14. He is to followup sooner if pain, swelling of the left cheek area worsens or if he starts developing fevers or chills. I discussed my concern of possible complications and need for followup and he voices understanding and agrees to followup on Thursday or sooner if indicated. - levofloxacin (LEVAQUIN) 750 MG tablet; Take  1 tablet (750 mg total) by mouth daily.  Dispense: 10 tablet; Refill: 0  2. Bacterial respiratory infection - levofloxacin (LEVAQUIN) 750 MG tablet; Take 1 tablet (750 mg total) by mouth daily.  Dispense: 10 tablet; Refill: 0   Signed, [address] [place], [place], [facility] 04/20/2014 12:12 PM

## 2014-04-23 ENCOUNTER — Ambulatory Visit (INDEPENDENT_AMBULATORY_CARE_PROVIDER_SITE_OTHER): Payer: Medicare Other | Admitting: Physician Assistant

## 2014-04-23 ENCOUNTER — Encounter: Payer: Self-pay | Admitting: Physician Assistant

## 2014-04-23 VITALS — BP 118/82 | HR 60 | Temp 97.8°F | Ht 68.0 in | Wt 230.0 lb

## 2014-04-23 DIAGNOSIS — J209 Acute bronchitis, unspecified: Secondary | ICD-10-CM

## 2014-04-23 DIAGNOSIS — J0100 Acute maxillary sinusitis, unspecified: Secondary | ICD-10-CM

## 2014-04-23 DIAGNOSIS — J0101 Acute recurrent maxillary sinusitis: Secondary | ICD-10-CM

## 2014-04-23 MED ORDER — PREDNISONE 20 MG PO TABS: ORAL_TABLET | ORAL | Status: DC

## 2014-04-23 NOTE — Progress Notes (Signed)
Patient ID: KERVENS ROPER MRN: [id number], DOB: 1946/06/14, 68 y.o. Date of Encounter: 04/23/2014, 10:16 AM    Chief Complaint:  Chief Complaint  Patient presents with  . Sinusitis    feeling some better, still not there.     HPI: 68 y.o. year old white male  is here for followup after his office visit with me on Monday 04/20/14.   THE FOLLOWING IS COPIED FROM 04/20/14 OV NOTE:   says he has had head/nasal congestion with thick mucus as well as chest congestion for over a week now. However says that Saturday-- 04/18/14-- he suddenly also developed pain in his left ear and cheek area just in front of the left ear. Has had no fevers or chills. No sore throat.   EENT: Normocephalic, atraumatic, eyes without discharge, sclera non-icteric, nares are without discharge. Bilateral auditory canals clear. Right TM is normal. Left TM is deep red erythema. Left cheek, just anterior to left ear, is slightly swollen and tender.     Oral cavity moist, posterior pharynx without exudate, erythema, peritonsillar abscess, or post nasal drip.  Neck: Supple. No thyromegaly. No lymphadenopathy. I palpate no enlarged lymph nodes and he reports that the left neck is no more tender than the right neck. Lungs: Clear bilaterally to auscultation without wheezes, rales, or rhonchi. Breathing is unlabored.   ASSESSMENT AND PLAN:  68 y.o. year old male with  1. Acute serous otitis media of left ear, recurrence not specified He is allergic to penicillin so will have to avoid Augmentin and Omnicef. I am concerned about possibly developing mastoiditis. He is to go to the pharmacy and get the Levaquin and start it immediately. He is to schedule followup office visit with me on Thursday 04/23/14. He is to followup sooner if pain, swelling of the left cheek area worsens or if he starts developing fevers or chills. I discussed my concern of possible complications and need for followup and he voices understanding and  agrees to followup on Thursday or sooner if indicated. - levofloxacin (LEVAQUIN) 750 MG tablet; Take 1 tablet (750 mg total) by mouth daily.  Dispense: 10 tablet; Refill: 0  2. Bacterial respiratory infection - levofloxacin (LEVAQUIN) 750 MG tablet; Take 1 tablet (750 mg total) by mouth daily.  Dispense: 10 tablet; Refill: 0   Today---04/23/2014: Today patient states he has been taking the Levaquin daily as directed. Says that he is feeling a little better but is still quite congested in his nasal area. The pain in the left ear area does seem improved.    Home Meds:   Outpatient Prescriptions Prior to Visit  Medication Sig Dispense Refill  . amLODipine (NORVASC) 5 MG tablet TAKE 1 TABLET BY MOUTH DAILY  180 tablet  1  . pravastatin (PRAVACHOL) 20 MG tablet Take 1 tablet (20 mg total) by mouth daily.  90 tablet  0  . VENTOLIN HFA 108 (90 BASE) MCG/ACT inhaler INHALE 1-2 PUFFS INTO THE LUNGS AS NEEDED.  18 each  10  . warfarin (COUMADIN) 5 MG tablet 1.5 tablets daily except 1 tablets on Mondays, Wednesdays and Fridays. OR AS DIRECTED BY COUMADIN CLINIC  135 tablet  1  . doxycycline (DORYX) 100 MG EC tablet Take 100 mg by mouth 2 (two) times daily.      .        No facility-administered medications prior to visit.    Allergies:  Allergies  Allergen Reactions  . Penicillins  Review of Systems: See HPI for pertinent ROS. All other ROS negative.    Physical Exam: Blood pressure 118/82, pulse 60, temperature 97.8 F (36.6 C), temperature source Oral, height 5\' 8"  (1.727 m), weight 230 lb (104.327 kg)., Body mass index is 34.98 kg/(m^2). General:  Overweight WM. Appears in no acute distress. HEENT: Normocephalic, atraumatic, eyes without discharge, sclera non-icteric, nares are without discharge. Bilateral auditory canals clear. Right TM is normal. Left TM is much improved. Now no erythema.  Left cheek, just anterior to left ear, now--has no tenderness with palpation and  swelling/inflammation resolved.  Oral cavity moist, posterior pharynx without exudate, erythema, peritonsillar abscess. Neck: Supple. No thyromegaly. No lymphadenopathy. I palpate no enlarged lymph nodes and he reports that the left neck is no more tender than the right neck. Lungs: He has very faint wheezes at bases bilaterally. Remainder of lungs are clear with good breath sounds. Heart: Irregular rhythm. Msk:  Strength and tone normal for age. Extremities/Skin: Warm and dry.  No rashes. Neuro: Alert and oriented X 3. Moves all extremities spontaneously. Gait is normal. CNII-XII grossly in tact. Psych:  Responds to questions appropriately with a normal affect.     ASSESSMENT AND PLAN:  68 y.o. year old male with   1. Acute recurrent maxillary sinusitis - predniSONE (DELTASONE) 20 MG tablet; Take 3 daily for 2 days, then 2 daily for 2 days, then 1 daily for 2 days.  Dispense: 12 tablet; Refill: 0  2. Acute bronchitis, unspecified organism - predniSONE (DELTASONE) 20 MG tablet; Take 3 daily for 2 days, then 2 daily for 2 days, then 1 daily for 2 days.  Dispense: 12 tablet; Refill: 0  He is to complete the Levaquin. Will add prednisone to help with his nasal congestion--to reduce inflammation and edema in his sinuses and nares. He sounds extremely congested when he talks. As well this will help with inflammation in his airways as he does have some slight wheezes at the bases.  He also asked me when he last had his cholesterol and sugar checked etc. I reviewed these and at that from when Dr. Stanford Breed checked them a couple years ago. He says that he sees Dr. Stanford Breed once a year or and is due to see him in November.  Told him that he could wait to have this lab drawn with Dr. Stanford Breed and scheduled to have a physical here. Also discussed colon screening and immunizations and other preventive care. He does want to schedule a complete physical exam here. This appointment was scheduled when  he left the office today. Discussed that Dr. Stanford Breed will be able to see lab results drawn here on Epic.   1. Acute serous otitis media of left ear, recurrence not specified He is allergic to penicillin so will have to avoid Augmentin and Omnicef. I am concerned about possibly developing mastoiditis. He is to go to the pharmacy and get the Levaquin and start it immediately. He is to schedule followup office visit with me on Thursday 04/23/14. He is to followup sooner if pain, swelling of the left cheek area worsens or if he starts developing fevers or chills. I discussed my concern of possible complications and need for followup and he voices understanding and agrees to followup on Thursday or sooner if indicated. - levofloxacin (LEVAQUIN) 750 MG tablet; Take 1 tablet (750 mg total) by mouth daily.  Dispense: 10 tablet; Refill: 0  2. Bacterial respiratory infection - levofloxacin (LEVAQUIN) 750 MG tablet; Take 1 tablet (  750 mg total) by mouth daily.  Dispense: 10 tablet; Refill: 0   Signed, [address] [place], [place], [facility] 04/23/2014 10:16 AM

## 2014-05-21 ENCOUNTER — Encounter: Payer: Medicare Other | Admitting: Physician Assistant

## 2014-05-29 ENCOUNTER — Other Ambulatory Visit: Payer: Self-pay | Admitting: *Deleted

## 2014-05-29 MED ORDER — PRAVASTATIN SODIUM 20 MG PO TABS: 20.0000 mg | ORAL_TABLET | Freq: Every day | ORAL | Status: DC

## 2014-06-01 ENCOUNTER — Other Ambulatory Visit: Payer: Self-pay

## 2014-06-01 MED ORDER — PRAVASTATIN SODIUM 20 MG PO TABS: 20.0000 mg | ORAL_TABLET | Freq: Every day | ORAL | Status: DC

## 2014-06-22 ENCOUNTER — Telehealth: Payer: Self-pay | Admitting: *Deleted

## 2014-06-23 ENCOUNTER — Encounter: Payer: Self-pay | Admitting: *Deleted

## 2014-06-23 NOTE — Telephone Encounter (Signed)
Patient requested a refill has not been seen since June 2015. Left a message on patient's voicemail to call the office to make an appointment.

## 2014-06-23 NOTE — Progress Notes (Signed)
This encounter was created in error - please disregard.

## 2014-06-24 NOTE — Telephone Encounter (Signed)
Pt calls back and made appt for Friday.

## 2014-06-26 ENCOUNTER — Ambulatory Visit (INDEPENDENT_AMBULATORY_CARE_PROVIDER_SITE_OTHER): Payer: Medicare Other

## 2014-06-26 DIAGNOSIS — Z7901 Long term (current) use of anticoagulants: Secondary | ICD-10-CM

## 2014-06-26 DIAGNOSIS — Z5181 Encounter for therapeutic drug level monitoring: Secondary | ICD-10-CM

## 2014-06-26 DIAGNOSIS — I4891 Unspecified atrial fibrillation: Secondary | ICD-10-CM

## 2014-06-26 LAB — POCT INR: INR: 1.7

## 2014-06-26 MED ORDER — WARFARIN SODIUM 5 MG PO TABS: ORAL_TABLET | ORAL | Status: DC

## 2014-07-20 NOTE — Progress Notes (Signed)
      HPI: FU atrial fibrillation. He also has a history of DVT and pulmonary emboli in 1988. Last echocardiogram in December of 2010 showed normal LV function. There was mild left ventricular hypertrophy. There was mild mitral regurgitation. Myoview in December of 2010 showed normal perfusion and normal LV function with an ejection fraction of 56%. Holter monitor in June of 2013 showed controlled atrial fibrillation. Abdominal ultrasound ordered at last office visit but not performed. Since last him, the patient denies any dyspnea on exertion, orthopnea, PND, pedal edema, palpitations, syncope or chest pain.   Current Outpatient Prescriptions  Medication Sig Dispense Refill  . amLODipine (NORVASC) 5 MG tablet TAKE 1 TABLET BY MOUTH DAILY 180 tablet 1  . pravastatin (PRAVACHOL) 20 MG tablet Take 1 tablet (20 mg total) by mouth daily. 90 tablet 0  . VENTOLIN HFA 108 (90 BASE) MCG/ACT inhaler INHALE 1-2 PUFFS INTO THE LUNGS AS NEEDED. 18 each 10  . warfarin (COUMADIN) 5 MG tablet 1.5 tablets daily except 1 tablets on Mondays, Wednesdays and Fridays. OR AS DIRECTED BY COUMADIN CLINIC 135 tablet 0   No current facility-administered medications for this visit.     Past Medical History  Diagnosis Date  . Edema of lower extremity   . Hypertension   . Lung collapse     LEFT LUNG, TWICE  . DVT (deep venous thrombosis)   . Pulmonary embolism   . Atrial fibrillation     Past Surgical History  Procedure Laterality Date  . Venous ligation surgery  1988  . Hernia repair  1992    History   Social History  . Marital Status: Married    Spouse Name: N/A    Number of Children: N/A  . Years of Education: N/A   Occupational History  . Not on file.   Social History Main Topics  . Smoking status: Former Smoker -- 0.25 packs/day    Types: Cigarettes    Quit date: 07/07/2012  . Smokeless tobacco: Never Used     Comment: pk a week  . Alcohol Use: No  . Drug Use: No  . Sexual Activity: Not  on file   Other Topics Concern  . Not on file   Social History Narrative    ROS: no fevers or chills, productive cough, hemoptysis, dysphasia, odynophagia, melena, hematochezia, dysuria, hematuria, rash, seizure activity, orthopnea, PND, pedal edema, claudication. Remaining systems are negative.  Physical Exam: Well-developed obese in no acute distress.  Skin is warm and dry.  HEENT is normal.  Neck is supple.  Chest is clear to auscultation with normal expansion.  Cardiovascular exam is irregular Abdominal exam nontender or distended. No masses palpated. Extremities show no edema. neuro grossly intact  ECG atrial fibrillation at a rate of 85. No significant ST changes.

## 2014-07-24 ENCOUNTER — Encounter: Payer: Self-pay | Admitting: *Deleted

## 2014-07-24 ENCOUNTER — Ambulatory Visit (INDEPENDENT_AMBULATORY_CARE_PROVIDER_SITE_OTHER): Payer: Medicare Other | Admitting: Cardiology

## 2014-07-24 ENCOUNTER — Ambulatory Visit (INDEPENDENT_AMBULATORY_CARE_PROVIDER_SITE_OTHER): Payer: Medicare Other | Admitting: Pharmacist Clinician (PhC)/ Clinical Pharmacy Specialist

## 2014-07-24 ENCOUNTER — Encounter: Payer: Self-pay | Admitting: Cardiology

## 2014-07-24 VITALS — BP 130/88 | HR 85 | Ht 68.0 in | Wt 236.2 lb

## 2014-07-24 DIAGNOSIS — I4891 Unspecified atrial fibrillation: Secondary | ICD-10-CM

## 2014-07-24 DIAGNOSIS — Z7901 Long term (current) use of anticoagulants: Secondary | ICD-10-CM

## 2014-07-24 DIAGNOSIS — R0989 Other specified symptoms and signs involving the circulatory and respiratory systems: Secondary | ICD-10-CM

## 2014-07-24 DIAGNOSIS — Z5181 Encounter for therapeutic drug level monitoring: Secondary | ICD-10-CM

## 2014-07-24 LAB — POCT INR: INR: 2.1

## 2014-07-24 NOTE — Assessment & Plan Note (Signed)
Abdominal ultrasound to exclude aneurysm. 

## 2014-07-24 NOTE — Assessment & Plan Note (Signed)
Continue statin. Check lipids and liver. 

## 2014-07-24 NOTE — Assessment & Plan Note (Signed)
Blood pressure controlled. Continue present medications.check potassium and renal function.

## 2014-07-24 NOTE — Assessment & Plan Note (Signed)
Rate is controlled on no medications. Continue Coumadin. Check hemoglobin.

## 2014-07-24 NOTE — Patient Instructions (Signed)
Your physician wants you to follow-up in: [place] will receive a reminder letter in the mail two months in advance. If you don't receive a letter, please call our office to schedule the follow-up appointment.   Your physician has requested that you have an abdominal aorta duplex. During this test, an ultrasound is used to evaluate the aorta. Allow 30 minutes for this exam. Do not eat after midnight the day before and avoid carbonated beverages   Your physician recommends that you return for lab work [place]

## 2014-08-03 ENCOUNTER — Ambulatory Visit (HOSPITAL_COMMUNITY)
Admission: RE | Admit: 2014-08-03 | Discharge: 2014-08-03 | Disposition: A | Discharge status: Home / Self Care | Payer: Medicare Other | Source: Ambulatory Visit | Attending: Cardiovascular Disease | Admitting: Cardiovascular Disease

## 2014-08-03 DIAGNOSIS — I10 Essential (primary) hypertension: Secondary | ICD-10-CM | POA: Insufficient documentation

## 2014-08-03 DIAGNOSIS — R0989 Other specified symptoms and signs involving the circulatory and respiratory systems: Secondary | ICD-10-CM

## 2014-08-03 DIAGNOSIS — Z8249 Family history of ischemic heart disease and other diseases of the circulatory system: Secondary | ICD-10-CM | POA: Diagnosis not present

## 2014-08-03 LAB — CBC
HCT: 46.1 % (ref 39.0–52.0)
HEMOGLOBIN: 16.1 g/dL (ref 13.0–17.0)
MCH: 31.9 pg (ref 26.0–34.0)
MCHC: 34.9 g/dL (ref 30.0–36.0)
MCV: 91.5 fL (ref 78.0–100.0)
MPV: 9.7 fL (ref 9.4–12.4)
PLATELETS: 246 10*3/uL (ref 150–400)
RBC: 5.04 MIL/uL (ref 4.22–5.81)
RDW: 14.1 % (ref 11.5–15.5)
WBC: 11.1 10*3/uL — ABNORMAL HIGH (ref 4.0–10.5)

## 2014-08-03 NOTE — Progress Notes (Signed)
Abdominal Aorta Completed. No Evidence of AAA noted.  Oda Cogan, BS, RDMS, RVT

## 2014-08-04 LAB — LIPID PANEL
Cholesterol: 140 mg/dL (ref 0–200)
HDL: 29 mg/dL — ABNORMAL LOW (ref >39)
LDL Cholesterol: 85 mg/dL (ref 0–99)
Total CHOL/HDL Ratio: 4.8 Ratio
Triglycerides: 128 mg/dL (ref <150)
VLDL: 26 mg/dL (ref 0–40)

## 2014-08-04 LAB — COMPREHENSIVE METABOLIC PANEL
ALT: 16 U/L (ref 0–53)
AST: 15 U/L (ref 0–37)
Albumin: 3.9 g/dL (ref 3.5–5.2)
Alkaline Phosphatase: 54 U/L (ref 39–117)
BILIRUBIN TOTAL: 0.7 mg/dL (ref 0.2–1.2)
BUN: 13 mg/dL (ref 6–23)
CALCIUM: 8.9 mg/dL (ref 8.4–10.5)
CO2: 28 mEq/L (ref 19–32)
Chloride: 102 mEq/L (ref 96–112)
Creat: 0.98 mg/dL (ref 0.50–1.35)
GLUCOSE: 106 mg/dL — AB (ref 70–99)
Potassium: 4.3 mEq/L (ref 3.5–5.3)
SODIUM: 139 meq/L (ref 135–145)
TOTAL PROTEIN: 6.5 g/dL (ref 6.0–8.3)

## 2014-08-05 ENCOUNTER — Ambulatory Visit (INDEPENDENT_AMBULATORY_CARE_PROVIDER_SITE_OTHER): Payer: Medicare Other | Admitting: Physician Assistant

## 2014-08-05 ENCOUNTER — Encounter: Payer: Self-pay | Admitting: Physician Assistant

## 2014-08-05 VITALS — BP 136/84 | HR 84 | Temp 98.0°F | Resp 20 | Wt 240.0 lb

## 2014-08-05 DIAGNOSIS — R739 Hyperglycemia, unspecified: Secondary | ICD-10-CM

## 2014-08-05 DIAGNOSIS — B9689 Other specified bacterial agents as the cause of diseases classified elsewhere: Secondary | ICD-10-CM

## 2014-08-05 DIAGNOSIS — J988 Other specified respiratory disorders: Secondary | ICD-10-CM

## 2014-08-05 DIAGNOSIS — J209 Acute bronchitis, unspecified: Secondary | ICD-10-CM

## 2014-08-05 MED ORDER — AZITHROMYCIN 250 MG PO TABS: ORAL_TABLET | ORAL | Status: DC

## 2014-08-05 MED ORDER — PREDNISONE 20 MG PO TABS: ORAL_TABLET | ORAL | Status: DC

## 2014-08-05 NOTE — Progress Notes (Signed)
Patient ID: William Stanley MRN: [id number], DOB: 07-Feb-1946, 68 y.o. Date of Encounter: 08/05/2014, 4:33 PM    Chief Complaint:  Chief Complaint  Patient presents with  . c/o bad sinus infection  . had recent labs at Cardioology    elevated WBC and glucose  told to see PCP     HPI: 68 y.o. year old white male says that symptoms hit him all of a sudden on December 23. Says at that time all of a sudden he developed a lot of congestion and pressure in his nose and head and sinuses. Since then, those symptoms have continued but he also has been coughing a lot.  Blows some mucus from his nose and it is thick dark mucus. Has had no significant sore throat and no ear pain no fevers or chills.     Home Meds:   Outpatient Prescriptions Prior to Visit  Medication Sig Dispense Refill  . amLODipine (NORVASC) 5 MG tablet TAKE 1 TABLET BY MOUTH DAILY 180 tablet 1  . pravastatin (PRAVACHOL) 20 MG tablet Take 1 tablet (20 mg total) by mouth daily. 90 tablet 0  . VENTOLIN HFA 108 (90 BASE) MCG/ACT inhaler INHALE 1-2 PUFFS INTO THE LUNGS AS NEEDED. 18 each 10  . warfarin (COUMADIN) 5 MG tablet 1.5 tablets daily except 1 tablets on Mondays, Wednesdays and Fridays. OR AS DIRECTED BY COUMADIN CLINIC 135 tablet 0   No facility-administered medications prior to visit.    Allergies:  Allergies  Allergen Reactions  . Penicillins       Review of Systems: See HPI for pertinent ROS. All other ROS negative.    Physical Exam: Blood pressure 136/84, pulse 84, temperature 98 F (36.7 C), temperature source Oral, resp. rate 20, weight 240 lb (108.863 kg)., Body mass index is 36.5 kg/(m^2). General:  WNWD WM. Abdominal Obesity. Appears in no acute distress. HEENT: Normocephalic, atraumatic, eyes without discharge, sclera non-icteric, nares are without discharge. Bilateral auditory canals clear, TM's are without perforation, pearly grey and translucent with reflective cone of light bilaterally. Oral  cavity moist, posterior pharynx without exudate, erythema, peritonsillar abscess. Minimal tenderness with percussion of frontal and maxillary sinuses bilaterally.  Neck: Supple. No thyromegaly. No lymphadenopathy. Lungs:  Mild wheezes throughout bilaterally. Increased wheezes/rhonchi left base. Good air movement throughout. No apparent dyspnea. No use of accessory muscles. Heart: Irregular rhythm. Msk:  Strength and tone normal for age. Extremities/Skin: Warm and dry.  Neuro: Alert and oriented X 3. Moves all extremities spontaneously. Gait is normal. CNII-XII grossly in tact. Psych:  Responds to questions appropriately with a normal affect.     ASSESSMENT AND PLAN:  68 y.o. year old male with  1. Bacterial respiratory infection PCN [place].  - azithromycin (ZITHROMAX) 250 MG tablet; Day 1: Take 2 daily.  Days 2-5: Take 1 daily.  Dispense: 6 tablet; Refill: 0 - predniSONE (DELTASONE) 20 MG tablet; Take 3 daily for 2 days, then 2 daily for 2 days, then 1 daily for 2 days.  Dispense: 12 tablet; Refill: 0  2. Acute bronchitis, unspecified organism - azithromycin (ZITHROMAX) 250 MG tablet; Day 1: Take 2 daily.  Days 2-5: Take 1 daily.  Dispense: 6 tablet; Refill: 0 - predniSONE (DELTASONE) 20 MG tablet; Take 3 daily for 2 days, then 2 daily for 2 days, then 1 daily for 2 days.  Dispense: 12 tablet; Refill: 0  I discussed the fact that he had some wheezing on exam. He says that he did smoke in  the past but quit 2 years ago. Also says that his left long has collapsed twice in the past and he has had blood clot in the lung. Says ever since those problems he has had problems with wheezing when he gets these types of infections.  He is instructed to take the antibiotic as directed. Take the prednisone as directed. Use his albuterol 2 puffs every 4 hours for several days then can go back to when necessary for this. Follow-up if symptoms worsen or do not resolve within 1 week after completion of  medications.  3. Hyperglycemia Dr. Stanford Breed recently did some labs that revealed glucose 106. Prior glucose levels were 101 and 103. Today I went ahead and gave him a carbohydrate handout sheet and discussed this with him. Right off, he says  " I know 2 things I could do that would make a big difference immediately---- decrease the Pepsi's and the bread"----says that he drinks a lot of Pepsi and says that he eats a lot of [employer] biscuit for breakfast and eats a lot of sandwiches for his other meals. Read with him that if he could decrease these that this would make a big difference but also gave and reviewed handout with information of other foods to limit and also discussed of these foods.  Since glucose is only slightly elevated I did not add an A1c. Have him make some diet changes and then we can monitor.   [address] [place], [place], [facility] 08/05/2014 4:33 PM

## 2014-09-02 ENCOUNTER — Other Ambulatory Visit: Payer: Self-pay | Admitting: *Deleted

## 2014-09-02 MED ORDER — AMLODIPINE BESYLATE 5 MG PO TABS: 5.0000 mg | ORAL_TABLET | Freq: Every day | ORAL | Status: DC

## 2014-09-08 ENCOUNTER — Ambulatory Visit (INDEPENDENT_AMBULATORY_CARE_PROVIDER_SITE_OTHER): Payer: Medicare Other | Admitting: *Deleted

## 2014-09-08 DIAGNOSIS — I4891 Unspecified atrial fibrillation: Secondary | ICD-10-CM

## 2014-09-08 DIAGNOSIS — Z5181 Encounter for therapeutic drug level monitoring: Secondary | ICD-10-CM

## 2014-09-08 DIAGNOSIS — Z7901 Long term (current) use of anticoagulants: Secondary | ICD-10-CM

## 2014-09-08 LAB — POCT INR: INR: 2.2

## 2014-09-15 ENCOUNTER — Ambulatory Visit: Payer: Self-pay

## 2014-10-12 ENCOUNTER — Other Ambulatory Visit: Payer: Self-pay

## 2014-10-12 MED ORDER — WARFARIN SODIUM 5 MG PO TABS: ORAL_TABLET | ORAL | Status: DC

## 2014-10-19 ENCOUNTER — Ambulatory Visit (INDEPENDENT_AMBULATORY_CARE_PROVIDER_SITE_OTHER): Payer: Medicare Other | Admitting: *Deleted

## 2014-10-19 DIAGNOSIS — Z5181 Encounter for therapeutic drug level monitoring: Secondary | ICD-10-CM

## 2014-10-19 DIAGNOSIS — I4891 Unspecified atrial fibrillation: Secondary | ICD-10-CM

## 2014-10-19 DIAGNOSIS — Z7901 Long term (current) use of anticoagulants: Secondary | ICD-10-CM

## 2014-10-19 LAB — POCT INR: INR: 2.1

## 2014-11-11 ENCOUNTER — Ambulatory Visit (INDEPENDENT_AMBULATORY_CARE_PROVIDER_SITE_OTHER): Payer: Medicare Other | Admitting: Physician Assistant

## 2014-11-11 ENCOUNTER — Encounter: Payer: Self-pay | Admitting: Physician Assistant

## 2014-11-11 VITALS — BP 110/60 | HR 64 | Temp 97.8°F | Resp 20 | Wt 234.0 lb

## 2014-11-11 DIAGNOSIS — J988 Other specified respiratory disorders: Secondary | ICD-10-CM

## 2014-11-11 DIAGNOSIS — B9689 Other specified bacterial agents as the cause of diseases classified elsewhere: Principal | ICD-10-CM

## 2014-11-11 MED ORDER — PREDNISONE 20 MG PO TABS: ORAL_TABLET | ORAL | Status: DC

## 2014-11-11 MED ORDER — AZITHROMYCIN 250 MG PO TABS: ORAL_TABLET | ORAL | Status: DC

## 2014-11-11 MED ORDER — FLUTICASONE PROPIONATE 50 MCG/ACT NA SUSP: 2.0000 | Freq: Every day | NASAL | Status: DC

## 2014-11-11 NOTE — Progress Notes (Signed)
Patient ID: William Stanley MRN: [id number], DOB: 1945-09-24, 69 y.o. Date of Encounter: 11/11/2014, 10:37 AM    Chief Complaint:  Chief Complaint  Patient presents with  . sick    recurrent sinus infection     HPI: 69 y.o. year old white male says that he's been having a lot of problems with this ever since February but has been so busy with his work schedule a etc. that he cannot come in. Says that he is getting mostly clear drainage from his nose. Ever has a deep cough and congestion in his chest. His nose is been feeling stopped up. He has been using his inhaler some as needed.     Home Meds:   Outpatient Prescriptions Prior to Visit  Medication Sig Dispense Refill  . amLODipine (NORVASC) 5 MG tablet Take 1 tablet (5 mg total) by mouth daily. 180 tablet 2  . pravastatin (PRAVACHOL) 20 MG tablet Take 1 tablet (20 mg total) by mouth daily. 90 tablet 0  . VENTOLIN HFA 108 (90 BASE) MCG/ACT inhaler INHALE 1-2 PUFFS INTO THE LUNGS AS NEEDED. 18 each 10  . warfarin (COUMADIN) 5 MG tablet 1.5 tablets daily except 1 tablets on Mondays, Wednesdays and Fridays. OR AS DIRECTED BY COUMADIN CLINIC 135 tablet 1  . azithromycin (ZITHROMAX) 250 MG tablet Day 1: Take 2 daily.  Days 2-5: Take 1 daily. 6 tablet 0  . predniSONE (DELTASONE) 20 MG tablet Take 3 daily for 2 days, then 2 daily for 2 days, then 1 daily for 2 days. 12 tablet 0   No facility-administered medications prior to visit.    Allergies:  Allergies  Allergen Reactions  . Penicillins       Review of Systems: See HPI for pertinent ROS. All other ROS negative.    Physical Exam: Blood pressure 110/60, pulse 64, temperature 97.8 F (36.6 C), temperature source Oral, resp. rate 20, weight 234 lb (106.142 kg)., Body mass index is 35.59 kg/(m^2). General: WNWD WM.  Appears in no acute distress. HEENT: Normocephalic, atraumatic, eyes without discharge, sclera non-icteric, nares are without discharge. Bilateral auditory  canals clear, TM's are without perforation, pearly grey and translucent with reflective cone of light bilaterally. Oral cavity moist, posterior pharynx without exudate, erythema, peritonsillar abscess. No tenderness with percussion of frontal and maxillary sinuses bilaterally.  Neck: Supple. No thyromegaly. No lymphadenopathy. Lungs: Slight wheeze at the end of expiration throughout bilaterally. Good air movement and breath sounds. No rhonchi or rales. Heart: Regular rhythm. No murmurs, rubs, or gallops. Msk:  Strength and tone normal for age. Extremities/Skin: Warm and dry.  Neuro: Alert and oriented X 3. Moves all extremities spontaneously. Gait is normal. CNII-XII grossly in tact. Psych:  Responds to questions appropriately with a normal affect.     ASSESSMENT AND PLAN:  69 y.o. year old male with  1. Bacterial respiratory infection - azithromycin (ZITHROMAX) 250 MG tablet; Day 1: take 2 daily.  Days 2-5: Take 1 daily.  Dispense: 6 tablet; Refill: 0 - predniSONE (DELTASONE) 20 MG tablet; Take 3 daily for 2 days, then 2 daily for 2 days, then 1 daily for 2 days.  Dispense: 12 tablet; Refill: 0 - fluticasone (FLONASE) 50 MCG/ACT nasal spray; Place 2 sprays into both nostrils daily.  Dispense: 16 g; Refill: 6  He  requested to have medication on hand to use in the future if these symptoms recur. Gave Flonase to use any time he has nasal congestion to help open that up.  He is to take the antibiotic and the prednisone taper as directed. Follow-up if symptoms do not resolve within 1 week after completion of these.   Signed, [address] [place], [place], [facility] 11/11/2014 10:37 AM

## 2014-11-30 ENCOUNTER — Other Ambulatory Visit: Payer: Self-pay | Admitting: *Deleted

## 2014-11-30 ENCOUNTER — Ambulatory Visit (INDEPENDENT_AMBULATORY_CARE_PROVIDER_SITE_OTHER): Payer: Medicare Other | Admitting: *Deleted

## 2014-11-30 DIAGNOSIS — Z5181 Encounter for therapeutic drug level monitoring: Secondary | ICD-10-CM | POA: Diagnosis not present

## 2014-11-30 DIAGNOSIS — I4891 Unspecified atrial fibrillation: Secondary | ICD-10-CM | POA: Diagnosis not present

## 2014-11-30 DIAGNOSIS — Z7901 Long term (current) use of anticoagulants: Secondary | ICD-10-CM | POA: Diagnosis not present

## 2014-11-30 LAB — POCT INR: INR: 2.5

## 2014-11-30 MED ORDER — PRAVASTATIN SODIUM 20 MG PO TABS: 20.0000 mg | ORAL_TABLET | Freq: Every day | ORAL | Status: DC

## 2014-12-07 ENCOUNTER — Other Ambulatory Visit: Payer: Self-pay | Admitting: Physician Assistant

## 2014-12-11 ENCOUNTER — Telehealth: Payer: Self-pay | Admitting: Family Medicine

## 2014-12-11 DIAGNOSIS — J988 Other specified respiratory disorders: Secondary | ICD-10-CM

## 2014-12-11 DIAGNOSIS — B9689 Other specified bacterial agents as the cause of diseases classified elsewhere: Principal | ICD-10-CM

## 2014-12-11 MED ORDER — FLUTICASONE PROPIONATE 50 MCG/ACT NA SUSP: 2.0000 | Freq: Every day | NASAL | Status: DC

## 2014-12-11 NOTE — Telephone Encounter (Signed)
Medication refilled per protocol. 

## 2014-12-22 ENCOUNTER — Telehealth: Payer: Self-pay | Admitting: Family Medicine

## 2014-12-22 DIAGNOSIS — B9689 Other specified bacterial agents as the cause of diseases classified elsewhere: Principal | ICD-10-CM

## 2014-12-22 DIAGNOSIS — J988 Other specified respiratory disorders: Secondary | ICD-10-CM

## 2014-12-22 MED ORDER — PREDNISONE 20 MG PO TABS: ORAL_TABLET | ORAL | Status: DC

## 2014-12-22 NOTE — Telephone Encounter (Signed)
Called pt and left message about rx

## 2014-12-22 NOTE — Telephone Encounter (Signed)
Pt has gotten into some poison ivy.  Broke out all over and itchy. Can we please order something?

## 2014-12-22 NOTE — Telephone Encounter (Signed)
Ok with prednisone dose pack.

## 2015-01-18 ENCOUNTER — Ambulatory Visit (INDEPENDENT_AMBULATORY_CARE_PROVIDER_SITE_OTHER): Payer: Medicare Other | Admitting: *Deleted

## 2015-01-18 DIAGNOSIS — Z7901 Long term (current) use of anticoagulants: Secondary | ICD-10-CM

## 2015-01-18 DIAGNOSIS — Z5181 Encounter for therapeutic drug level monitoring: Secondary | ICD-10-CM | POA: Diagnosis not present

## 2015-01-18 DIAGNOSIS — I4891 Unspecified atrial fibrillation: Secondary | ICD-10-CM

## 2015-01-18 LAB — POCT INR: INR: 2.6

## 2015-02-23 ENCOUNTER — Other Ambulatory Visit: Payer: Self-pay

## 2015-02-23 MED ORDER — PRAVASTATIN SODIUM 20 MG PO TABS: 20.0000 mg | ORAL_TABLET | Freq: Every day | ORAL | Status: DC

## 2015-02-24 ENCOUNTER — Other Ambulatory Visit: Payer: Self-pay

## 2015-02-24 MED ORDER — PRAVASTATIN SODIUM 20 MG PO TABS: 20.0000 mg | ORAL_TABLET | Freq: Every day | ORAL | Status: DC

## 2015-03-01 ENCOUNTER — Ambulatory Visit (INDEPENDENT_AMBULATORY_CARE_PROVIDER_SITE_OTHER): Payer: Medicare Other | Admitting: Pharmacist

## 2015-03-01 DIAGNOSIS — I4891 Unspecified atrial fibrillation: Secondary | ICD-10-CM | POA: Diagnosis not present

## 2015-03-01 DIAGNOSIS — Z5181 Encounter for therapeutic drug level monitoring: Secondary | ICD-10-CM | POA: Diagnosis not present

## 2015-03-01 DIAGNOSIS — Z7901 Long term (current) use of anticoagulants: Secondary | ICD-10-CM

## 2015-03-01 LAB — POCT INR: INR: 2.5

## 2015-04-26 ENCOUNTER — Ambulatory Visit (INDEPENDENT_AMBULATORY_CARE_PROVIDER_SITE_OTHER): Payer: Medicare Other

## 2015-04-26 DIAGNOSIS — Z5181 Encounter for therapeutic drug level monitoring: Secondary | ICD-10-CM | POA: Diagnosis not present

## 2015-04-26 DIAGNOSIS — Z7901 Long term (current) use of anticoagulants: Secondary | ICD-10-CM | POA: Diagnosis not present

## 2015-04-26 DIAGNOSIS — I4891 Unspecified atrial fibrillation: Secondary | ICD-10-CM | POA: Diagnosis not present

## 2015-04-26 LAB — POCT INR: INR: 2.8

## 2015-04-28 ENCOUNTER — Ambulatory Visit (INDEPENDENT_AMBULATORY_CARE_PROVIDER_SITE_OTHER): Payer: Medicare Other | Admitting: Physician Assistant

## 2015-04-28 ENCOUNTER — Encounter: Payer: Self-pay | Admitting: Physician Assistant

## 2015-04-28 VITALS — BP 114/72 | HR 66 | Temp 98.0°F | Resp 18 | Wt 224.0 lb

## 2015-04-28 DIAGNOSIS — J019 Acute sinusitis, unspecified: Secondary | ICD-10-CM | POA: Diagnosis not present

## 2015-04-28 DIAGNOSIS — J209 Acute bronchitis, unspecified: Secondary | ICD-10-CM

## 2015-04-28 DIAGNOSIS — J988 Other specified respiratory disorders: Secondary | ICD-10-CM

## 2015-04-28 DIAGNOSIS — Z720 Tobacco use: Secondary | ICD-10-CM | POA: Diagnosis not present

## 2015-04-28 DIAGNOSIS — B9689 Other specified bacterial agents as the cause of diseases classified elsewhere: Secondary | ICD-10-CM

## 2015-04-28 MED ORDER — PREDNISONE 20 MG PO TABS: ORAL_TABLET | ORAL | Status: DC

## 2015-04-28 NOTE — Progress Notes (Signed)
Patient ID: William Stanley MRN: [id number], DOB: [birth year], 69 y.o. Date of Encounter: 04/28/2015, 2:17 PM    Chief Complaint:  Chief Complaint  Patient presents with  . Sinusitis    X Monday AM, congestion, cough     HPI: 69 y.o. year old white male presents with above.  Says that he felt fine on Sunday but then all of a sudden Monday at 1 AM he started having these symptoms.  Developed a lot of drainage and cough and "chest real tight ". Says that his nose and head have been congested and stopped up causing headache.  Says at times he can't get anything out of his nose. At other times it will "run clear like water ". Says he used his albuterol inhaler 2 or 3 times yesterday.     Home Meds:   Outpatient Prescriptions Prior to Visit  Medication Sig Dispense Refill  . amLODipine (NORVASC) 5 MG tablet Take 1 tablet (5 mg total) by mouth daily. 180 tablet 2  . cetirizine (ZYRTEC) 10 MG tablet Take 10 mg by mouth daily.    . fluticasone (FLONASE) 50 MCG/ACT nasal spray Place 2 sprays into both nostrils daily. 16 g 6  . pravastatin (PRAVACHOL) 20 MG tablet Take 1 tablet (20 mg total) by mouth daily. 90 tablet 1  . VENTOLIN HFA 108 (90 BASE) MCG/ACT inhaler INHALE 1-2 PUFFS INTO THE LUNGS AS NEEDED. 18 Inhaler 10  . warfarin (COUMADIN) 5 MG tablet 1.5 tablets daily except 1 tablets on Mondays, Wednesdays and Fridays. OR AS DIRECTED BY COUMADIN CLINIC 135 tablet 1  . azithromycin (ZITHROMAX) 250 MG tablet Day 1: take 2 daily.  Days 2-5: Take 1 daily. (Patient not taking: Reported on 04/28/2015) 6 tablet 0  . predniSONE (DELTASONE) 20 MG tablet Take 3 daily for 2 days, then 2 daily for 2 days, then 1 daily for 2 days. (Patient not taking: Reported on 04/28/2015) 12 tablet 0   No facility-administered medications prior to visit.    Allergies:  Allergies  Allergen Reactions  . Penicillins       Review of Systems: See HPI for pertinent ROS. All other ROS negative.     Physical Exam: Blood pressure 114/72, pulse 66, temperature 98 F (36.7 C), temperature source Oral, resp. rate 18, weight 224 lb (101.606 kg)., Body mass index is 34.07 kg/(m^2). General: WNWD WF.  Appears in no acute distress. HEENT: Normocephalic, atraumatic, eyes without discharge, sclera non-icteric, nares are without discharge. Bilateral auditory canals clear, TM's are without perforation, pearly grey and translucent with reflective cone of light bilaterally. Oral cavity moist, posterior pharynx without exudate, erythema, peritonsillar abscess. Minimal tenderness with percussion of frontal and maxillary sinuses bilaterally.  Neck: Supple. No thyromegaly. No lymphadenopathy. Lungs: Mild wheezes bilaterally throughout. Heart: Regular rhythm. No murmurs, rubs, or gallops. Msk:  Strength and tone normal for age. Extremities/Skin: Warm and dry. Neuro: Alert and oriented X 3. Moves all extremities spontaneously. Gait is normal. CNII-XII grossly in tact. Psych:  Responds to questions appropriately with a normal affect.     ASSESSMENT AND PLAN:  69 y.o. year old male with  1. Acute rhinosinusitis  2. Acute bronchitis, unspecified organism  3. Tobacco abuse  At this time, I do not think he requires antibiotics. Told him to take the prednisone as directed. Also, use the albuterol 2 puffs every 4 hours for several days until breathing improves. Discussed that if mucus/phlegm becomes thick and dark, to call me and will  add antibiotic. - predniSONE (DELTASONE) 20 MG tablet; Take 3 daily for 2 days, then 2 daily for 2 days, then 1 daily for 2 days.  Dispense: 12 tablet; Refill: 0  When I had finished the visit he then brings up problem with pain in his left shoulder when he abducts. I gave him a handout with some stretches and recommended he do those and then follow-up if it did not improve/resolve.  Then he starts talking about waking up 2 or 3 times a night to urinate and wanted to ask  me what I thought about him ordering some treatment for that off the Internet. I then discussed checking a PSA and if normal starting Flomax.  Then he brought up the fact that he had eaten and further discussion led to that he wanted to check sugar cholesterol etc. He states that he has not had a complete physical exam in well over a year. I discussed him scheduling a complete physical exam and he is agreeable to do so.  Marin Olp [place], [place], [facility] 04/28/2015 2:17 PM

## 2015-05-12 ENCOUNTER — Encounter: Payer: Medicare Other | Admitting: Physician Assistant

## 2015-05-17 ENCOUNTER — Other Ambulatory Visit: Payer: Self-pay | Admitting: Pharmacist

## 2015-05-17 MED ORDER — WARFARIN SODIUM 5 MG PO TABS: ORAL_TABLET | ORAL | Status: DC

## 2015-06-07 ENCOUNTER — Ambulatory Visit (INDEPENDENT_AMBULATORY_CARE_PROVIDER_SITE_OTHER): Payer: Medicare Other | Admitting: *Deleted

## 2015-06-07 DIAGNOSIS — Z5181 Encounter for therapeutic drug level monitoring: Secondary | ICD-10-CM

## 2015-06-07 DIAGNOSIS — I4891 Unspecified atrial fibrillation: Secondary | ICD-10-CM

## 2015-06-07 DIAGNOSIS — Z7901 Long term (current) use of anticoagulants: Secondary | ICD-10-CM | POA: Diagnosis not present

## 2015-06-07 LAB — POCT INR: INR: 1.7

## 2015-06-23 ENCOUNTER — Telehealth: Payer: Self-pay | Admitting: *Deleted

## 2015-06-23 NOTE — Telephone Encounter (Signed)
Pt states that he missed his dose of coumadin on Saturday night which was Coumadin 7.'5mg'$  so pt instructed to take extra 1/2 tablet (2.'5mg'$ ) today November 16th and to continue same dose coumadin 7.'5mg'$  daily except '5mg'$  on Mondays Wednesdays and Fridays and to keep his appt in coumadin clinic on Monday the 21st and he states understanding

## 2015-06-28 ENCOUNTER — Ambulatory Visit (INDEPENDENT_AMBULATORY_CARE_PROVIDER_SITE_OTHER): Payer: Medicare Other | Admitting: *Deleted

## 2015-06-28 DIAGNOSIS — Z7901 Long term (current) use of anticoagulants: Secondary | ICD-10-CM

## 2015-06-28 DIAGNOSIS — Z5181 Encounter for therapeutic drug level monitoring: Secondary | ICD-10-CM

## 2015-06-28 DIAGNOSIS — I4891 Unspecified atrial fibrillation: Secondary | ICD-10-CM

## 2015-06-28 LAB — POCT INR: INR: 2.1

## 2015-07-14 ENCOUNTER — Telehealth: Payer: Self-pay | Admitting: Cardiology

## 2015-07-14 NOTE — Telephone Encounter (Signed)
Left message for pt to come to appt fasting if he would like his cholesterol checked.

## 2015-07-14 NOTE — Telephone Encounter (Signed)
Pt says he would like to have some lab work when he comes for his appt on Monday morning please.

## 2015-07-16 NOTE — Progress Notes (Signed)
      HPI: FU atrial fibrillation. He also has a history of DVT and pulmonary emboli in 1988. Last echocardiogram in December of 2010 showed normal LV function. There was mild left ventricular hypertrophy. There was mild mitral regurgitation. Myoview in December of 2010 showed normal perfusion and normal LV function with an ejection fraction of 56%. Holter monitor in June of 2013 showed controlled atrial fibrillation. Abdominal ultrasound December 2015 showed no aneurysm. Since last him, the patient has dyspnea with more extreme activities but not with routine activities. It is relieved with rest. It is not associated with chest pain. There is no orthopnea, PND or pedal edema. There is no syncope or palpitations. There is no exertional chest pain.   Current Outpatient Prescriptions  Medication Sig Dispense Refill  . amLODipine (NORVASC) 5 MG tablet Take 1 tablet (5 mg total) by mouth daily. 180 tablet 2  . pravastatin (PRAVACHOL) 20 MG tablet Take 1 tablet (20 mg total) by mouth daily. 90 tablet 1  . VENTOLIN HFA 108 (90 BASE) MCG/ACT inhaler INHALE 1-2 PUFFS INTO THE LUNGS AS NEEDED. 18 Inhaler 10  . warfarin (COUMADIN) 5 MG tablet 1.5 tablets daily except 1 tablets on Mondays, Wednesdays and Fridays. OR AS DIRECTED BY COUMADIN CLINIC 135 tablet 1   No current facility-administered medications for this visit.     Past Medical History  Diagnosis Date  . Edema of lower extremity   . Hypertension   . Lung collapse     LEFT LUNG, TWICE  . DVT (deep venous thrombosis) ([place])   . Pulmonary embolism ([place])   . Atrial fibrillation [facility])     Past Surgical History  Procedure Laterality Date  . Venous ligation surgery  1988  . Hernia repair  1992    Social History   Social History  . Marital Status: Married    Spouse Name: N/A  . Number of Children: N/A  . Years of Education: N/A   Occupational History  . Not on file.   Social History Main Topics  . Smoking status: Former Smoker --  0.25 packs/day    Types: Cigarettes    Quit date: 07/07/2012  . Smokeless tobacco: Never Used     Comment: pk a week  . Alcohol Use: No  . Drug Use: No  . Sexual Activity: Not on file   Other Topics Concern  . Not on file   Social History Narrative    ROS: no fevers or chills, productive cough, hemoptysis, dysphasia, odynophagia, melena, hematochezia, dysuria, hematuria, rash, seizure activity, orthopnea, PND, pedal edema, claudication. Remaining systems are negative.  Physical Exam: Well-developed well-nourished in no acute distress.  Skin is warm and dry.  HEENT is normal.  Neck is supple.  Chest Mild expiratory wheeze. Cardiovascular exam is irregular Abdominal exam nontender or distended. No masses palpated. Extremities show no edema. neuro grossly intact  ECG Atrial fibrillation at a rate of 85. No ST changes.

## 2015-07-19 ENCOUNTER — Encounter: Payer: Self-pay | Admitting: Cardiology

## 2015-07-19 ENCOUNTER — Ambulatory Visit (INDEPENDENT_AMBULATORY_CARE_PROVIDER_SITE_OTHER): Payer: Medicare Other | Admitting: Cardiology

## 2015-07-19 VITALS — BP 132/62 | HR 85 | Ht 68.0 in | Wt 231.0 lb

## 2015-07-19 DIAGNOSIS — E785 Hyperlipidemia, unspecified: Secondary | ICD-10-CM | POA: Diagnosis not present

## 2015-07-19 DIAGNOSIS — I4821 Permanent atrial fibrillation: Secondary | ICD-10-CM

## 2015-07-19 DIAGNOSIS — I482 Chronic atrial fibrillation: Secondary | ICD-10-CM

## 2015-07-19 DIAGNOSIS — Z79899 Other long term (current) drug therapy: Secondary | ICD-10-CM | POA: Diagnosis not present

## 2015-07-19 DIAGNOSIS — I10 Essential (primary) hypertension: Secondary | ICD-10-CM | POA: Diagnosis not present

## 2015-07-19 LAB — HEPATIC FUNCTION PANEL
ALBUMIN: 3.9 g/dL (ref 3.6–5.1)
ALK PHOS: 50 U/L (ref 40–115)
ALT: 18 U/L (ref 9–46)
AST: 14 U/L (ref 10–35)
Bilirubin, Direct: 0.2 mg/dL (ref <=0.2)
Indirect Bilirubin: 0.4 mg/dL (ref 0.2–1.2)
TOTAL PROTEIN: 6.5 g/dL (ref 6.1–8.1)
Total Bilirubin: 0.6 mg/dL (ref 0.2–1.2)

## 2015-07-19 LAB — CBC
HCT: 50.2 % (ref 39.0–52.0)
Hemoglobin: 16.9 g/dL (ref 13.0–17.0)
MCH: 32 pg (ref 26.0–34.0)
MCHC: 33.7 g/dL (ref 30.0–36.0)
MCV: 95.1 fL (ref 78.0–100.0)
MPV: 10.1 fL (ref 8.6–12.4)
Platelets: 262 10*3/uL (ref 150–400)
RBC: 5.28 MIL/uL (ref 4.22–5.81)
RDW: 13.9 % (ref 11.5–15.5)
WBC: 10.3 10*3/uL (ref 4.0–10.5)

## 2015-07-19 LAB — LIPID PANEL
CHOLESTEROL: 144 mg/dL (ref 125–200)
HDL: 31 mg/dL — AB (ref >=40)
LDL Cholesterol: 87 mg/dL (ref <130)
Total CHOL/HDL Ratio: 4.6 Ratio (ref <=5.0)
Triglycerides: 132 mg/dL (ref <150)
VLDL: 26 mg/dL (ref <30)

## 2015-07-19 LAB — BASIC METABOLIC PANEL
BUN: 15 mg/dL (ref 7–25)
CALCIUM: 9.6 mg/dL (ref 8.6–10.3)
CO2: 29 mmol/L (ref 20–31)
Chloride: 103 mmol/L (ref 98–110)
Creat: 0.93 mg/dL (ref 0.70–1.25)
GLUCOSE: 102 mg/dL — AB (ref 65–99)
Potassium: 4.7 mmol/L (ref 3.5–5.3)
SODIUM: 141 mmol/L (ref 135–146)

## 2015-07-19 MED ORDER — WARFARIN SODIUM 5 MG PO TABS: ORAL_TABLET | ORAL | Status: DC

## 2015-07-19 MED ORDER — AMLODIPINE BESYLATE 5 MG PO TABS: 5.0000 mg | ORAL_TABLET | Freq: Every day | ORAL | Status: DC

## 2015-07-19 MED ORDER — PRAVASTATIN SODIUM 20 MG PO TABS: 20.0000 mg | ORAL_TABLET | Freq: Every day | ORAL | Status: DC

## 2015-07-19 NOTE — Assessment & Plan Note (Signed)
Blood pressure controlled. Continue present medications. 

## 2015-07-19 NOTE — Assessment & Plan Note (Signed)
Continue statin. Check lipids and liver. 

## 2015-07-19 NOTE — Assessment & Plan Note (Addendum)
Patient remains in permanent atrial fibrillation. His rate is controlled. Continue Coumadin. Check hemoglobin. Pt given names of DOACs. He will check with his insurance company and if covered he will consider changing.

## 2015-07-19 NOTE — Patient Instructions (Addendum)
Medication Instructions:  Please continue your current medications  Labwork: Your physician recommends that you return for lab work TODAY.  Testing/Procedures: NONE  Follow-Up: Dr Stanford Breed recommends that you schedule a follow-up appointment in 1 Year. You will receive a reminder letter in the mail two months in advance. If you don't receive a letter, please call our office to schedule the follow-up appointment.  If you need a refill on your cardiac medications before your next appointment, please call your pharmacy.

## 2015-07-26 ENCOUNTER — Ambulatory Visit (INDEPENDENT_AMBULATORY_CARE_PROVIDER_SITE_OTHER): Payer: Medicare Other

## 2015-07-26 DIAGNOSIS — Z7901 Long term (current) use of anticoagulants: Secondary | ICD-10-CM | POA: Diagnosis not present

## 2015-07-26 DIAGNOSIS — Z5181 Encounter for therapeutic drug level monitoring: Secondary | ICD-10-CM | POA: Diagnosis not present

## 2015-07-26 DIAGNOSIS — I4891 Unspecified atrial fibrillation: Secondary | ICD-10-CM

## 2015-07-26 DIAGNOSIS — I4821 Permanent atrial fibrillation: Secondary | ICD-10-CM

## 2015-07-26 DIAGNOSIS — I482 Chronic atrial fibrillation: Secondary | ICD-10-CM

## 2015-07-26 LAB — POCT INR: INR: 2.1

## 2015-08-11 ENCOUNTER — Telehealth: Payer: Self-pay | Admitting: Cardiology

## 2015-08-11 NOTE — Telephone Encounter (Signed)
Pt would like his lab results from 07-19-15 please.

## 2015-08-11 NOTE — Telephone Encounter (Signed)
Spoke with pt, aware of lab results. Copy mailed to the pt at his request.

## 2015-08-30 ENCOUNTER — Ambulatory Visit (INDEPENDENT_AMBULATORY_CARE_PROVIDER_SITE_OTHER): Payer: Medicare Other | Admitting: *Deleted

## 2015-08-30 DIAGNOSIS — I482 Chronic atrial fibrillation: Secondary | ICD-10-CM

## 2015-08-30 DIAGNOSIS — Z7901 Long term (current) use of anticoagulants: Secondary | ICD-10-CM | POA: Diagnosis not present

## 2015-08-30 DIAGNOSIS — I4891 Unspecified atrial fibrillation: Secondary | ICD-10-CM | POA: Diagnosis not present

## 2015-08-30 DIAGNOSIS — I4821 Permanent atrial fibrillation: Secondary | ICD-10-CM

## 2015-08-30 DIAGNOSIS — Z5181 Encounter for therapeutic drug level monitoring: Secondary | ICD-10-CM

## 2015-08-30 LAB — POCT INR: INR: 2

## 2015-09-06 ENCOUNTER — Encounter: Payer: Self-pay | Admitting: Physician Assistant

## 2015-09-06 ENCOUNTER — Ambulatory Visit (INDEPENDENT_AMBULATORY_CARE_PROVIDER_SITE_OTHER): Payer: Medicare Other | Admitting: Physician Assistant

## 2015-09-06 VITALS — BP 132/78 | HR 76 | Temp 97.7°F | Resp 18 | Wt 231.0 lb

## 2015-09-06 DIAGNOSIS — J0191 Acute recurrent sinusitis, unspecified: Secondary | ICD-10-CM

## 2015-09-06 DIAGNOSIS — J209 Acute bronchitis, unspecified: Secondary | ICD-10-CM

## 2015-09-06 MED ORDER — AZITHROMYCIN 250 MG PO TABS: ORAL_TABLET | ORAL | Status: DC

## 2015-09-06 MED ORDER — METHYLPREDNISOLONE ACETATE 80 MG/ML IJ SUSP
80.0000 mg | Freq: Once | INTRAMUSCULAR | Status: AC
  Administered 2015-09-06: 80 mg via INTRAMUSCULAR

## 2015-09-06 MED ORDER — PREDNISONE 20 MG PO TABS: 20.0000 mg | ORAL_TABLET | Freq: Every day | ORAL | Status: DC

## 2015-09-06 MED ORDER — IPRATROPIUM-ALBUTEROL 0.5-2.5 (3) MG/3ML IN SOLN
3.0000 mL | Freq: Once | RESPIRATORY_TRACT | Status: AC
  Administered 2015-09-06: 3 mL via RESPIRATORY_TRACT

## 2015-09-06 NOTE — Progress Notes (Signed)
Patient ID: MICHAI DIEPPA MRN: [id number], DOB: 26-Jun-1946, 70 y.o. Date of Encounter: 09/06/2015, 8:29 AM    Chief Complaint:  Chief Complaint  Patient presents with  . recurrent sinus issues     HPI: 70 y.o. year old white male presents with above symptoms. Says that his symptoms have been a lot worse over the past week. Feels stopped up in his head and nose. Headache and pressure. Patient says occasionally his "nose will run like a faucet" and also down his throat but otherwise it is just stopped up. Also feels that his lungs are tight. Used his albuterol about 4-5 times yesterday. No fevers or chills. No sore throat.     Home Meds:   Outpatient Prescriptions Prior to Visit  Medication Sig Dispense Refill  . amLODipine (NORVASC) 5 MG tablet Take 1 tablet (5 mg total) by mouth daily. 180 tablet 3  . pravastatin (PRAVACHOL) 20 MG tablet Take 1 tablet (20 mg total) by mouth daily. 90 tablet 3  . VENTOLIN HFA 108 (90 BASE) MCG/ACT inhaler INHALE 1-2 PUFFS INTO THE LUNGS AS NEEDED. 18 Inhaler 10  . warfarin (COUMADIN) 5 MG tablet 1.5 tablets daily except 1 tablets on Mondays, Wednesdays and Fridays. OR AS DIRECTED BY COUMADIN CLINIC 135 tablet 3   No facility-administered medications prior to visit.    Allergies:  Allergies  Allergen Reactions  . Penicillins       Review of Systems: See HPI for pertinent ROS. All other ROS negative.    Physical Exam: Blood pressure 132/78, pulse 76, temperature 97.7 F (36.5 C), temperature source Oral, resp. rate 18, weight 231 lb (104.781 kg)., Body mass index is 35.13 kg/(m^2). General:  Obese WM. Appears in no acute distress. HEENT: Normocephalic, atraumatic, eyes without discharge, sclera non-icteric, nares are without discharge. Bilateral auditory canals clear, TM's are without perforation, pearly grey and translucent with reflective cone of light bilaterally. Oral cavity moist, posterior pharynx without exudate, erythema,  peritonsillar abscess. Sinuses are visibly swollen--puffy under eyes bilaterally.   Neck: Supple. No thyromegaly. No lymphadenopathy. Lungs: Wheezes throughout bilaterally. Oxygen saturation 96% on room air. Heart: Regular rhythm. No murmurs, rubs, or gallops. Msk:  Strength and tone normal for age. Extremities/Skin: Warm and dry. Neuro: Alert and oriented X 3. Moves all extremities spontaneously. Gait is normal. CNII-XII grossly in tact. Psych:  Responds to questions appropriately with a normal affect.     ASSESSMENT AND PLAN:  70 y.o. year old male with  1. Acute bronchitis, unspecified organism Give Depo-Medrol 80 mg IM here. Will give DuoNeb here. He is to start the azithromycin today. He is to start the oral prednisone tomorrow. F/U if symptoms worsen or do not return to normal baseline upon completion of these medications. Continue the albuterol inhaler every 4-6 hours as needed. - azithromycin (ZITHROMAX) 250 MG tablet; Day 1: Take 2 daily. Days 2-5: Take 1 daily.  Dispense: 6 tablet; Refill: 0 - predniSONE (DELTASONE) 20 MG tablet; Take 1 tablet (20 mg total) by mouth daily with breakfast.  Dispense: 5 tablet; Refill: 0  2. Acute recurrent sinusitis, unspecified location Give Depo-Medrol 80 mg IM here. Will give DuoNeb here. He is to start the azithromycin today. He is to start the oral prednisone tomorrow. F/U if symptoms worsen or do not return to normal baseline upon completion of these medications. Continue the albuterol inhaler every 4-6 hours as needed.  - azithromycin (ZITHROMAX) 250 MG tablet; Day 1: Take 2 daily. Days 2-5: Take  1 daily.  Dispense: 6 tablet; Refill: 0 - predniSONE (DELTASONE) 20 MG tablet; Take 1 tablet (20 mg total) by mouth daily with breakfast.  Dispense: 5 tablet; Refill: 0   Signed, [address] [place], [place], [facility] 09/06/2015   8:29 AM

## 2015-09-06 NOTE — Addendum Note (Signed)
Addended by: Olena Mater on: 09/06/2015 09:01 AM   Modules accepted: Orders

## 2015-10-11 ENCOUNTER — Encounter (INDEPENDENT_AMBULATORY_CARE_PROVIDER_SITE_OTHER): Payer: Self-pay

## 2015-10-11 ENCOUNTER — Ambulatory Visit (INDEPENDENT_AMBULATORY_CARE_PROVIDER_SITE_OTHER): Payer: Medicare Other | Admitting: *Deleted

## 2015-10-11 DIAGNOSIS — I4821 Permanent atrial fibrillation: Secondary | ICD-10-CM

## 2015-10-11 DIAGNOSIS — I482 Chronic atrial fibrillation: Secondary | ICD-10-CM

## 2015-10-11 DIAGNOSIS — Z5181 Encounter for therapeutic drug level monitoring: Secondary | ICD-10-CM

## 2015-10-11 DIAGNOSIS — I4891 Unspecified atrial fibrillation: Secondary | ICD-10-CM | POA: Diagnosis not present

## 2015-10-11 DIAGNOSIS — Z7901 Long term (current) use of anticoagulants: Secondary | ICD-10-CM

## 2015-10-11 LAB — POCT INR: INR: 2.4

## 2015-10-12 ENCOUNTER — Telehealth: Payer: Self-pay | Admitting: *Deleted

## 2015-10-12 NOTE — Telephone Encounter (Signed)
William Stanley calls back to inform us that pt does not need to stop Coumadin for Cataract procedure.

## 2015-11-29 ENCOUNTER — Ambulatory Visit (INDEPENDENT_AMBULATORY_CARE_PROVIDER_SITE_OTHER): Payer: Medicare Other | Admitting: *Deleted

## 2015-11-29 ENCOUNTER — Other Ambulatory Visit: Payer: Self-pay | Admitting: Physician Assistant

## 2015-11-29 DIAGNOSIS — I4821 Permanent atrial fibrillation: Secondary | ICD-10-CM

## 2015-11-29 DIAGNOSIS — Z7901 Long term (current) use of anticoagulants: Secondary | ICD-10-CM | POA: Diagnosis not present

## 2015-11-29 DIAGNOSIS — I4891 Unspecified atrial fibrillation: Secondary | ICD-10-CM

## 2015-11-29 DIAGNOSIS — I482 Chronic atrial fibrillation: Secondary | ICD-10-CM

## 2015-11-29 DIAGNOSIS — Z5181 Encounter for therapeutic drug level monitoring: Secondary | ICD-10-CM

## 2015-11-29 LAB — POCT INR: INR: 2.3

## 2015-11-29 NOTE — Telephone Encounter (Signed)
Medication refilled per protocol. 

## 2015-12-07 ENCOUNTER — Encounter (HOSPITAL_COMMUNITY): Payer: Self-pay | Admitting: Emergency Medicine

## 2015-12-07 ENCOUNTER — Emergency Department (HOSPITAL_COMMUNITY)
Admission: EM | Admit: 2015-12-07 | Discharge: 2015-12-07 | Disposition: A | Discharge status: Home / Self Care | Payer: Medicare Other | Attending: Emergency Medicine | Admitting: Emergency Medicine

## 2015-12-07 DIAGNOSIS — Z7901 Long term (current) use of anticoagulants: Secondary | ICD-10-CM | POA: Diagnosis not present

## 2015-12-07 DIAGNOSIS — I4891 Unspecified atrial fibrillation: Secondary | ICD-10-CM | POA: Diagnosis not present

## 2015-12-07 DIAGNOSIS — Z79899 Other long term (current) drug therapy: Secondary | ICD-10-CM | POA: Diagnosis not present

## 2015-12-07 DIAGNOSIS — R040 Epistaxis: Secondary | ICD-10-CM | POA: Diagnosis present

## 2015-12-07 DIAGNOSIS — Z7952 Long term (current) use of systemic steroids: Secondary | ICD-10-CM | POA: Insufficient documentation

## 2015-12-07 DIAGNOSIS — Z7951 Long term (current) use of inhaled steroids: Secondary | ICD-10-CM | POA: Diagnosis not present

## 2015-12-07 DIAGNOSIS — Z792 Long term (current) use of antibiotics: Secondary | ICD-10-CM | POA: Diagnosis not present

## 2015-12-07 DIAGNOSIS — I82409 Acute embolism and thrombosis of unspecified deep veins of unspecified lower extremity: Secondary | ICD-10-CM | POA: Diagnosis not present

## 2015-12-07 DIAGNOSIS — I10 Essential (primary) hypertension: Secondary | ICD-10-CM | POA: Insufficient documentation

## 2015-12-07 DIAGNOSIS — Z87891 Personal history of nicotine dependence: Secondary | ICD-10-CM | POA: Insufficient documentation

## 2015-12-07 LAB — PROTIME-INR
INR: 2.06 — AB (ref 0.00–1.49)
Prothrombin Time: 22.4 seconds — ABNORMAL HIGH (ref 11.6–15.2)

## 2015-12-07 MED ORDER — OXYMETAZOLINE HCL 0.05 % NA SOLN
1.0000 | Freq: Once | NASAL | Status: AC
  Administered 2015-12-07: 1 via NASAL
  Filled 2015-12-07: qty 15

## 2015-12-07 NOTE — ED Notes (Signed)
No active epistaxis at present.

## 2015-12-07 NOTE — ED Notes (Signed)
Nurse at bedside collecting labs 

## 2015-12-07 NOTE — ED Notes (Signed)
PA at bedside.

## 2015-12-07 NOTE — ED Provider Notes (Signed)
CSN: [id number]     Arrival date & time 12/07/15  8338 History   First MD Initiated Contact with Patient 12/07/15 [phone]     Chief Complaint  Patient presents with  . Epistaxis   Patient is a 70 y.o. male presenting with nosebleeds.  Epistaxis Location:  R nare Severity:  Moderate Progression:  Resolved Chronicity:  New Context: anticoagulants and hypertension   Context: not bleeding disorder, not elevation change, not home oxygen, not nose picking, not recent infection and not trauma   Relieved by:  Applying pressure Worsened by:  Movement Associated symptoms: blood in oropharynx and congestion   Associated symptoms: no cough, no dizziness, no fever, no headaches, no sinus pain, no sore throat and no syncope    Mr. Simmer is a 70 year old male with past medical history of hypertension and A. fib on Coumadin presenting with epistaxis. Nosebleed started approximately 2 hours prior to arrival. The bleeding is from the right nare. He has been applying pressure to the nose which initially resolved the bleeding upon arrival to the emergency department. He then ambulated to the bathroom which causes bleeding to return. He applied pressure again which resolved the bleeding and he has had no active epistaxis for the past 3 hours. He denies history of frequent nosebleeds. He does endorse frequent sinus infections and feels that he has been congested recently. Reports compliance with his Coumadin and states that his INR is typically around 2.1. Endorses a history of hypertension but has not taken his medications today. Denies dizziness, lightheadedness, near-syncope, blurred vision, chest pain, shortness of breath or any other complaints today.  Past Medical History  Diagnosis Date  . Edema of lower extremity   . Hypertension   . Lung collapse     LEFT LUNG, TWICE  . DVT (deep venous thrombosis) ([place])   . Pulmonary embolism ([place])   . Atrial fibrillation [facility])    Past Surgical History  Procedure  Laterality Date  . Venous ligation surgery  1988  . Hernia repair  1992  . Eye surgery     Family History  Problem Relation Age of Onset  . Heart attack Mother   . Heart failure Mother   . Arrhythmia Father   . Heart failure Father    Social History  Substance Use Topics  . Smoking status: Former Smoker -- 0.25 packs/day    Types: Cigarettes    Quit date: 07/07/2012  . Smokeless tobacco: Never Used     Comment: pk a week  . Alcohol Use: No    Review of Systems  Constitutional: Negative for fever.  HENT: Positive for congestion and nosebleeds. Negative for sore throat.   Respiratory: Negative for cough.   Cardiovascular: Negative for syncope.  Neurological: Negative for dizziness and headaches.  All other systems reviewed and are negative.     Allergies  Penicillins  Home Medications   Prior to Admission medications   Medication Sig Start Date End Date Taking? Authorizing Provider  amLODipine (NORVASC) 5 MG tablet Take 1 tablet (5 mg total) by mouth daily. 07/19/15  Yes Lelon Perla, MD  pravastatin (PRAVACHOL) 20 MG tablet Take 1 tablet (20 mg total) by mouth daily. 07/19/15  Yes Lelon Perla, MD  VENTOLIN HFA 108 [phone] Base) MCG/ACT inhaler INHALE 1-2 PUFFS INTO LUNGS AS NEEDED 11/29/15  Yes [name], PA-C  warfarin (COUMADIN) 5 MG tablet 1.5 tablets daily except 1 tablets on Mondays, Wednesdays and Fridays. OR AS DIRECTED BY COUMADIN CLINIC  07/19/15  Yes Lelon Perla, MD  azithromycin (ZITHROMAX) 250 MG tablet Day 1: Take 2 daily. Days 2-5: Take 1 daily. Patient not taking: Reported on 12/07/2015 09/06/15   [name], PA-C  predniSONE (DELTASONE) 20 MG tablet Take 1 tablet (20 mg total) by mouth daily with breakfast. Patient not taking: Reported on 12/07/2015 09/06/15   [name], PA-C   BP 159/103 mmHg  Pulse 70  Temp(Src) 97.4 F (36.3 C) (Oral)  Resp 18  Ht '5\' 8"'$  (7.062 m)  Wt 114.306 kg  BMI 38.33 kg/m2  SpO2 96% Physical Exam   Constitutional: He appears well-developed and well-nourished. No distress.  HENT:  Head: Normocephalic and atraumatic.  Right Ear: External ear normal.  Left Ear: External ear normal.  Nose: No mucosal edema. Epistaxis (resolved) is observed. Right sinus exhibits no maxillary sinus tenderness and no frontal sinus tenderness. Left sinus exhibits no maxillary sinus tenderness and no frontal sinus tenderness.  Dry blood noted in the right nare. No septal hematoma or perforation. No sinus tenderness to palpation.  Eyes: Conjunctivae are normal. Right eye exhibits no discharge. Left eye exhibits no discharge. No scleral icterus.  Neck: Normal range of motion.  Cardiovascular: Normal rate.   Pulmonary/Chest: Effort normal.  Musculoskeletal: Normal range of motion.  Moves all extremities spontaneously  Neurological: He is alert. Coordination normal.  Skin: Skin is warm and dry.  Psychiatric: He has a normal mood and affect. His behavior is normal.  Nursing note and vitals reviewed.   ED Course  Procedures (including critical care time) Labs Review Labs Reviewed  PROTIME-INR - Abnormal; Notable for the following:    Prothrombin Time 22.4 (*)    INR 2.06 (*)    All other components within normal limits    Imaging Review No results found. I have personally reviewed and evaluated these images and lab results as part of my medical decision-making.   EKG Interpretation None      MDM   Final diagnoses:  Epistaxis   70 year old male presenting with epistaxis. Resolved with pressure prior to my evaluation. Hypertensive to 160/100. Remaining vitals stable. Patient admits not taking his blood pressure medications today. Dried blood noted in the right nare. No active bleeding at this time. Small amount of blood noted in the oropharynx. INR 2.06. Patient ambulates in the bathroom which briefly restarted the epistaxis. Resolved with pressure again and given one spray of Afrin in each naris.  Monitored in emergency department with no recurrent bleeding and 3 hours. Patient maintains a symptomatic and has no complaints prior to discharge. Discussed with patient that he'll need to take his blood pressure medication as soon as he returns home. Encouraged patient to follow up with his PCP in the next 2-3 days for a blood pressure recheck.  At this time there does not appear to be any evidence of an acute emergency medical condition and the patient appears stable for discharge with appropriate outpatient follow up. Diagnosis was discussed with patient who verbalizes understanding and is agreeable to discharge. Pt case discussed with Dr. Zenia Resides who agrees with my plan. Return precautions given in discharge paperwork and discussed with pt at bedside. Pt is stable for discharge.     Lahoma Crocker Kelcy Baeten, PA-C 12/07/15 1330  Lacretia Leigh, MD 12/10/15 1400

## 2015-12-07 NOTE — ED Notes (Signed)
Bleeding has returned after patient ambulated to the bathroom, denies pain. Pinching the site.

## 2015-12-07 NOTE — ED Notes (Signed)
Pt states his nose began bleeding around 0630 for no reason this morning. On Coumadin. Hx of HTN and does take his medication.

## 2015-12-07 NOTE — Discharge Instructions (Signed)

## 2015-12-09 ENCOUNTER — Encounter: Payer: Self-pay | Admitting: Physician Assistant

## 2015-12-09 ENCOUNTER — Ambulatory Visit (INDEPENDENT_AMBULATORY_CARE_PROVIDER_SITE_OTHER): Payer: Medicare Other | Admitting: Physician Assistant

## 2015-12-09 VITALS — BP 124/68 | HR 72 | Temp 97.8°F | Resp 18 | Wt 250.0 lb

## 2015-12-09 DIAGNOSIS — M25512 Pain in left shoulder: Secondary | ICD-10-CM

## 2015-12-09 DIAGNOSIS — R040 Epistaxis: Secondary | ICD-10-CM | POA: Diagnosis not present

## 2015-12-09 NOTE — Progress Notes (Signed)
    Patient ID: William Stanley MRN: [id number], DOB: 1945/10/05, 70 y.o. Date of Encounter: 12/09/2015, 1:28 PM    Chief Complaint:  Chief Complaint  Patient presents with  . ED follow up    nose bleed/ blood pressure     HPI: 70 y.o. year old white male here for f/u after ER visit.   Reviewed ER note form 12/07/2015. He presented with epistaxis. Epistaxis resolved with pt applying pressure to nares prior to ED Physician eval. Epistaxis did not re-occur. INR was 2.1. BP was elevated but had not taken BP med yet that day. Was to resume usual BP meds and f/u here.  Pt has had no recurrent epistaxis.   Pt states that he has been doing stretches and exercises I gave him for his left shoulder at a past visit, but the left shoulder pain is not resolving. Wants referral to f/u at [facility].   No other complaint/concern.      Home Meds:   Outpatient Prescriptions Prior to Visit  Medication Sig Dispense Refill  . amLODipine (NORVASC) 5 MG tablet Take 1 tablet (5 mg total) by mouth daily. 180 tablet 3  . pravastatin (PRAVACHOL) 20 MG tablet Take 1 tablet (20 mg total) by mouth daily. 90 tablet 3  . VENTOLIN HFA 108 (90 Base) MCG/ACT inhaler INHALE 1-2 PUFFS INTO LUNGS AS NEEDED 18 g 3  . warfarin (COUMADIN) 5 MG tablet 1.5 tablets daily except 1 tablets on Mondays, Wednesdays and Fridays. OR AS DIRECTED BY COUMADIN CLINIC 135 tablet 3  . azithromycin (ZITHROMAX) 250 MG tablet Day 1: Take 2 daily. Days 2-5: Take 1 daily. (Patient not taking: Reported on 12/07/2015) 6 tablet 0  . predniSONE (DELTASONE) 20 MG tablet Take 1 tablet (20 mg total) by mouth daily with breakfast. (Patient not taking: Reported on 12/07/2015) 5 tablet 0   No facility-administered medications prior to visit.    Allergies:  Allergies  Allergen Reactions  . Penicillins       Review of Systems: See HPI for pertinent ROS. All other ROS negative.    Physical Exam: Blood pressure 124/68, pulse 72,  temperature 97.8 F (36.6 C), temperature source Oral, resp. rate 18, weight 250 lb (113.399 kg)., Body mass index is 38.02 kg/(m^2). General: Obese WM Appears in no acute distress. HEENT: Normocephalic, atraumatic. Nares--Right--very tiny 81m dots of dark dried blood in 2 or 3 areas. O/w nares normal. No lesions, no bleeding.  Neck: Supple. No thyromegaly. No lymphadenopathy. Lungs: Clear bilaterally to auscultation without wheezes, rales, or rhonchi. Breathing is unlabored. Heart: Regular rhythm. No murmurs, rubs, or gallops. Msk:  Strength and tone normal for age. Extremities/Skin: Warm and dry. Neuro: Alert and oriented X 3. Moves all extremities spontaneously. Gait is normal. CNII-XII grossly in tact. Psych:  Responds to questions appropriately with a normal affect.     ASSESSMENT AND PLAN:  70y.o. year old male with  1. Epistaxis --Resolved  2. Left shoulder pain Added comment to schedule at [facility]- Ambulatory referral to Orthopedic Surgery   Signed, M[facility] HospitalDCl[facility] PU[facility]5/11/2015 1:28 PM

## 2016-01-10 ENCOUNTER — Ambulatory Visit (INDEPENDENT_AMBULATORY_CARE_PROVIDER_SITE_OTHER): Payer: Medicare Other | Admitting: *Deleted

## 2016-01-10 DIAGNOSIS — I482 Chronic atrial fibrillation: Secondary | ICD-10-CM | POA: Diagnosis not present

## 2016-01-10 DIAGNOSIS — Z7901 Long term (current) use of anticoagulants: Secondary | ICD-10-CM

## 2016-01-10 DIAGNOSIS — I4891 Unspecified atrial fibrillation: Secondary | ICD-10-CM | POA: Diagnosis not present

## 2016-01-10 DIAGNOSIS — Z5181 Encounter for therapeutic drug level monitoring: Secondary | ICD-10-CM

## 2016-01-10 DIAGNOSIS — I4821 Permanent atrial fibrillation: Secondary | ICD-10-CM

## 2016-01-10 LAB — POCT INR: INR: 2.7

## 2016-02-21 ENCOUNTER — Ambulatory Visit (INDEPENDENT_AMBULATORY_CARE_PROVIDER_SITE_OTHER): Payer: Medicare Other | Admitting: *Deleted

## 2016-02-21 DIAGNOSIS — Z5181 Encounter for therapeutic drug level monitoring: Secondary | ICD-10-CM | POA: Diagnosis not present

## 2016-02-21 DIAGNOSIS — I4891 Unspecified atrial fibrillation: Secondary | ICD-10-CM | POA: Diagnosis not present

## 2016-02-21 DIAGNOSIS — I482 Chronic atrial fibrillation: Secondary | ICD-10-CM | POA: Diagnosis not present

## 2016-02-21 DIAGNOSIS — I4821 Permanent atrial fibrillation: Secondary | ICD-10-CM

## 2016-02-21 DIAGNOSIS — Z7901 Long term (current) use of anticoagulants: Secondary | ICD-10-CM | POA: Diagnosis not present

## 2016-02-21 LAB — POCT INR: INR: 2.5

## 2016-03-15 ENCOUNTER — Encounter (HOSPITAL_BASED_OUTPATIENT_CLINIC_OR_DEPARTMENT_OTHER): Payer: Medicare Other | Attending: Surgery

## 2016-03-15 DIAGNOSIS — Z87891 Personal history of nicotine dependence: Secondary | ICD-10-CM | POA: Insufficient documentation

## 2016-03-15 DIAGNOSIS — I481 Persistent atrial fibrillation: Secondary | ICD-10-CM | POA: Diagnosis not present

## 2016-03-15 DIAGNOSIS — I87332 Chronic venous hypertension (idiopathic) with ulcer and inflammation of left lower extremity: Secondary | ICD-10-CM | POA: Insufficient documentation

## 2016-03-15 DIAGNOSIS — Z86711 Personal history of pulmonary embolism: Secondary | ICD-10-CM | POA: Diagnosis not present

## 2016-03-15 DIAGNOSIS — I10 Essential (primary) hypertension: Secondary | ICD-10-CM | POA: Diagnosis not present

## 2016-03-15 DIAGNOSIS — Z86718 Personal history of other venous thrombosis and embolism: Secondary | ICD-10-CM | POA: Diagnosis not present

## 2016-03-15 DIAGNOSIS — L97821 Non-pressure chronic ulcer of other part of left lower leg limited to breakdown of skin: Secondary | ICD-10-CM | POA: Diagnosis not present

## 2016-03-22 DIAGNOSIS — I87332 Chronic venous hypertension (idiopathic) with ulcer and inflammation of left lower extremity: Secondary | ICD-10-CM | POA: Diagnosis not present

## 2016-03-23 ENCOUNTER — Other Ambulatory Visit: Payer: Self-pay | Admitting: Physician Assistant

## 2016-03-23 ENCOUNTER — Other Ambulatory Visit: Payer: Self-pay | Admitting: Surgery

## 2016-03-23 ENCOUNTER — Encounter: Payer: Self-pay | Admitting: Physician Assistant

## 2016-03-23 ENCOUNTER — Ambulatory Visit (INDEPENDENT_AMBULATORY_CARE_PROVIDER_SITE_OTHER): Payer: Medicare Other | Admitting: Physician Assistant

## 2016-03-23 ENCOUNTER — Telehealth: Payer: Self-pay | Admitting: Family Medicine

## 2016-03-23 VITALS — BP 148/78 | HR 88 | Temp 98.7°F | Resp 16 | Ht 68.0 in

## 2016-03-23 DIAGNOSIS — I10 Essential (primary) hypertension: Secondary | ICD-10-CM | POA: Diagnosis not present

## 2016-03-23 DIAGNOSIS — R0981 Nasal congestion: Secondary | ICD-10-CM

## 2016-03-23 DIAGNOSIS — I739 Peripheral vascular disease, unspecified: Secondary | ICD-10-CM

## 2016-03-23 MED ORDER — BENAZEPRIL HCL 10 MG PO TABS: 10.0000 mg | ORAL_TABLET | Freq: Every day | ORAL | 0 refills | Status: DC

## 2016-03-23 MED ORDER — FLUTICASONE PROPIONATE 50 MCG/ACT NA SUSP: 2.0000 | Freq: Every day | NASAL | 6 refills | Status: DC

## 2016-03-23 NOTE — Telephone Encounter (Signed)
Agree. No notes from wound center in epic. No recent office visits from anywhere that includes any blood pressure readings for me to review.

## 2016-03-23 NOTE — Progress Notes (Signed)
Patient ID: William Stanley MRN: [id number], DOB: [birth year], 70 y.o. Date of Encounter: 03/23/2016, 2:30 PM    Chief Complaint:  Chief Complaint  Patient presents with  . Other    States his BP has been high - had surgery yesterday at the wound center for wound on leg   . Other    nasal congestion      HPI: 70 y.o. year old male presents with above.   He recently called here reporting that when he had gone to the wound clinic recently his blood pressure was high and he was concerned. Therefore, he was scheduled visit for today and we did get a fax from the wound center with some recent blood pressure readings there. 03/15/2016 at 8:00 AM blood pressure reading at the wound center was 162/98. 03/22/2016 at 9:15 AM at the wound Center blood pressure reading was 160/88.  He has had no other blood pressure checks elsewhere to know how his blood pressure has been running otherwise----since LOV with me--12/09/2015.  I did review his anticoagulation visits but they do not check blood pressures at those visits.  He is taking current blood pressure medication Norvasc 5 mg daily as directed.  He is going to the wound center secondary to wound on lower leg from venous stasis ulcer.  Today he states that he also has been having some nasal congestion last few days but not as bad as it usually is in the past. Blowing nothing out of his nose. No chest congestion or cough. No sore throat or earache fevers or chills.  No other complaints or concerns.     Home Meds:   Outpatient Medications Prior to Visit  Medication Sig Dispense Refill  . amLODipine (NORVASC) 5 MG tablet Take 1 tablet (5 mg total) by mouth daily. 180 tablet 3  . pravastatin (PRAVACHOL) 20 MG tablet Take 1 tablet (20 mg total) by mouth daily. 90 tablet 3  . VENTOLIN HFA 108 (90 Base) MCG/ACT inhaler INHALE 1-2 PUFFS INTO LUNGS AS NEEDED 18 g 3  . warfarin (COUMADIN) 5 MG tablet 1.5 tablets daily except 1 tablets on Mondays,  Wednesdays and Fridays. OR AS DIRECTED BY COUMADIN CLINIC 135 tablet 3   No facility-administered medications prior to visit.     Allergies:  Allergies  Allergen Reactions  . Penicillins       Review of Systems: See HPI for pertinent ROS. All other ROS negative.    Physical Exam: Blood pressure (!) 148/78, pulse 88, temperature 98.7 F (37.1 C), temperature source Oral, resp. rate 16, height '5\' 8"'$  (1.727 m), SpO2 95 %., There is no height or weight on file to calculate BMI. General: WM. Appears in no acute distress. HEENT: Normocephalic, atraumatic, eyes without discharge, sclera non-icteric, nares are without discharge. Bilateral auditory canals clear, TM's are without perforation, pearly grey and translucent with reflective cone of light bilaterally. Oral cavity moist, posterior pharynx without exudate, erythema.  Neck: Supple. No thyromegaly. No lymphadenopathy. Lungs: Clear bilaterally to auscultation without wheezes, rales, or rhonchi. Breathing is unlabored. Heart: Regular rhythm. No murmurs, rubs, or gallops. Msk:  Strength and tone normal for age. Extremities/Skin: Left lower leg bandaged. I did not remove this as it is managed at wound center. Neuro: Alert and oriented X 3. Moves all extremities spontaneously. Gait is normal. CNII-XII grossly in tact. Psych:  Responds to questions appropriately with a normal affect.     ASSESSMENT AND PLAN:  70 y.o. year old male with  1. Essential hypertension I rechecked blood pressure today myself. On the right I get 134/88. On the left I get 130/90. Blood pressure readings from the wound Center are documented above in history of present illness. Do not want to increase the dose of his Norvasc---asked him where his wound is that he is going to the wound center about-- and he says on his lower leg-- that the lower leg swells and then starts draining and then it burst open and that's when he has to go to the wound Center. Do not want to  cause any lower extremity edema that could exacerbate this. Will add low-dose ACE inhibitor. Have him return in 2 weeks to recheck blood pressure and check BMET.  Last BMET was 07/2015 at which time it was normal. In the interim, he is to keep log of BP readings from any visits at the wound center and bring those for me to review at that follow-up visit in 2 weeks. - benazepril (LOTENSIN) 10 MG tablet; Take 1 tablet (10 mg total) by mouth daily.  Dispense: 30 tablet; Refill: 0  2. Nasal congestion For his nasal congestion he is to use Flonase. F/U if worsens.  - fluticasone (FLONASE) 50 MCG/ACT nasal spray; Place 2 sprays into both nostrils daily.  Dispense: 16 g; Refill: 6   Signed, [address] [place], [place], [facility] 03/23/2016 2:30 PM

## 2016-03-23 NOTE — Telephone Encounter (Signed)
Has been getting wound care at wound center.  Says he is concerned because BP is always up there.  Doesn't want to have a stroke!!.  Questions if medication needs to be adjusted.  Said BP was 116!!  I told him that was good BP.  Doesn't really remember.  Told him will need to see before adjusting meds.  Call wound center and have them fax [name] your BP readings form their visits and made him an appt to be seen.

## 2016-03-23 NOTE — Telephone Encounter (Signed)
Refill appropriate and filled per protocol. 

## 2016-03-24 ENCOUNTER — Ambulatory Visit (INDEPENDENT_AMBULATORY_CARE_PROVIDER_SITE_OTHER): Payer: Medicare Other | Admitting: Pharmacist

## 2016-03-24 DIAGNOSIS — Z5181 Encounter for therapeutic drug level monitoring: Secondary | ICD-10-CM | POA: Diagnosis not present

## 2016-03-24 DIAGNOSIS — Z7901 Long term (current) use of anticoagulants: Secondary | ICD-10-CM

## 2016-03-24 DIAGNOSIS — I4891 Unspecified atrial fibrillation: Secondary | ICD-10-CM | POA: Diagnosis not present

## 2016-03-24 LAB — POCT INR: INR: 2.7

## 2016-03-28 ENCOUNTER — Other Ambulatory Visit: Payer: Self-pay | Admitting: Surgery

## 2016-03-28 DIAGNOSIS — L97929 Non-pressure chronic ulcer of unspecified part of left lower leg with unspecified severity: Principal | ICD-10-CM

## 2016-03-28 DIAGNOSIS — I87332 Chronic venous hypertension (idiopathic) with ulcer and inflammation of left lower extremity: Secondary | ICD-10-CM

## 2016-03-29 ENCOUNTER — Ambulatory Visit (HOSPITAL_COMMUNITY)
Admission: RE | Admit: 2016-03-29 | Discharge: 2016-03-29 | Disposition: A | Discharge status: Home / Self Care | Payer: Medicare Other | Source: Ambulatory Visit | Attending: Cardiovascular Disease | Admitting: Cardiovascular Disease

## 2016-03-29 DIAGNOSIS — I87332 Chronic venous hypertension (idiopathic) with ulcer and inflammation of left lower extremity: Secondary | ICD-10-CM | POA: Diagnosis not present

## 2016-03-29 DIAGNOSIS — L97929 Non-pressure chronic ulcer of unspecified part of left lower leg with unspecified severity: Secondary | ICD-10-CM

## 2016-03-29 DIAGNOSIS — I872 Venous insufficiency (chronic) (peripheral): Secondary | ICD-10-CM | POA: Insufficient documentation

## 2016-03-31 ENCOUNTER — Telehealth: Payer: Self-pay | Admitting: Family Medicine

## 2016-03-31 DIAGNOSIS — I87332 Chronic venous hypertension (idiopathic) with ulcer and inflammation of left lower extremity: Secondary | ICD-10-CM | POA: Diagnosis not present

## 2016-03-31 NOTE — Telephone Encounter (Signed)
Pt asking for Handicap placard.  OK to complete?

## 2016-04-03 ENCOUNTER — Encounter: Payer: Self-pay | Admitting: Physician Assistant

## 2016-04-03 ENCOUNTER — Other Ambulatory Visit: Payer: Self-pay | Admitting: Physician Assistant

## 2016-04-03 ENCOUNTER — Ambulatory Visit (INDEPENDENT_AMBULATORY_CARE_PROVIDER_SITE_OTHER): Payer: Medicare Other | Admitting: Physician Assistant

## 2016-04-03 VITALS — BP 140/88 | HR 85 | Temp 97.5°F | Resp 16 | Wt 250.0 lb

## 2016-04-03 DIAGNOSIS — R351 Nocturia: Principal | ICD-10-CM

## 2016-04-03 DIAGNOSIS — E785 Hyperlipidemia, unspecified: Secondary | ICD-10-CM | POA: Diagnosis not present

## 2016-04-03 DIAGNOSIS — N401 Enlarged prostate with lower urinary tract symptoms: Secondary | ICD-10-CM | POA: Diagnosis not present

## 2016-04-03 DIAGNOSIS — Z125 Encounter for screening for malignant neoplasm of prostate: Secondary | ICD-10-CM

## 2016-04-03 DIAGNOSIS — I10 Essential (primary) hypertension: Secondary | ICD-10-CM | POA: Diagnosis not present

## 2016-04-03 LAB — COMPLETE METABOLIC PANEL WITH GFR
ALT: 28 U/L (ref 9–46)
AST: 18 U/L (ref 10–35)
Albumin: 3.8 g/dL (ref 3.6–5.1)
Alkaline Phosphatase: 42 U/L (ref 40–115)
BUN: 15 mg/dL (ref 7–25)
CHLORIDE: 102 mmol/L (ref 98–110)
CO2: 28 mmol/L (ref 20–31)
CREATININE: 1.13 mg/dL (ref 0.70–1.25)
Calcium: 9.4 mg/dL (ref 8.6–10.3)
GFR, Est African American: 76 mL/min (ref >=60)
GFR, Est Non African American: 66 mL/min (ref >=60)
GLUCOSE: 133 mg/dL — AB (ref 70–99)
POTASSIUM: 4.5 mmol/L (ref 3.5–5.3)
SODIUM: 139 mmol/L (ref 135–146)
Total Bilirubin: 0.6 mg/dL (ref 0.2–1.2)
Total Protein: 6.5 g/dL (ref 6.1–8.1)

## 2016-04-03 LAB — LIPID PANEL
CHOL/HDL RATIO: 4.7 ratio (ref <=5.0)
Cholesterol: 149 mg/dL (ref 125–200)
HDL: 32 mg/dL — ABNORMAL LOW (ref >=40)
LDL CALC: 88 mg/dL (ref <130)
Triglycerides: 146 mg/dL (ref <150)
VLDL: 29 mg/dL (ref <30)

## 2016-04-03 LAB — PSA: PSA: 5.3 ng/mL — ABNORMAL HIGH (ref <=4.0)

## 2016-04-03 MED ORDER — TAMSULOSIN HCL 0.4 MG PO CAPS: 0.4000 mg | ORAL_CAPSULE | Freq: Every day | ORAL | 3 refills | Status: DC

## 2016-04-03 NOTE — Telephone Encounter (Signed)
Yes. Approved.

## 2016-04-03 NOTE — Telephone Encounter (Signed)
Given to patient at office visit today

## 2016-04-03 NOTE — Progress Notes (Signed)
Patient ID: William Stanley MRN: [id number], DOB: [birth year], 70 y.o. Date of Encounter: 04/03/2016, 9:43 AM    Chief Complaint:  Chief Complaint  Patient presents with  . Follow-up     HPI: 70 y.o. year old male presents with above.    03/23/2016: He recently called here reporting that when he had gone to the wound clinic recently his blood pressure was high and he was concerned. Therefore, he was scheduled visit for today and we did get a fax from the wound center with some recent blood pressure readings there. 03/15/2016 at 8:00 AM blood pressure reading at the wound center was 162/98. 03/22/2016 at 9:15 AM at the wound Center blood pressure reading was 160/88.  He has had no other blood pressure checks elsewhere to know how his blood pressure has been running otherwise----since LOV with me--12/09/2015.  I did review his anticoagulation visits but they do not check blood pressures at those visits.  He is taking current blood pressure medication Norvasc 5 mg daily as directed.  He is going to the wound center secondary to wound on lower leg from venous stasis ulcer.  Today he states that he also has been having some nasal congestion last few days but not as bad as it usually is in the past. Blowing nothing out of his nose. No chest congestion or cough. No sore throat or earache fevers or chills.  No other complaints or concerns.  AT THAT OV: Added benazepril '10mg'$  QD.   04/03/2016: States that he is taking the benazepril 10 mg daily in addition to his other medicines.  He checked his blood pressure and pulse on one occasion and brought in his readings. Blood Pressure was 127/71.  Pulse was 72. He states that since he knew he had to do blood work today, he did come fasting so that we can check his cholesterol as well.  Also wants to check PSA. Says he wakes up 3 or 4 times a night to urinate. Wants a medication to help with that. No other complaints or concerns.  Home Meds:   Outpatient Medications Prior to Visit  Medication Sig Dispense Refill  . amLODipine (NORVASC) 5 MG tablet Take 1 tablet (5 mg total) by mouth daily. 180 tablet 3  . benazepril (LOTENSIN) 10 MG tablet TAKE 1 TABLET(10 MG) BY MOUTH DAILY 90 tablet 0  . doxycycline (DORYX) 100 MG EC tablet Take 100 mg by mouth 2 (two) times daily.    . fluticasone (FLONASE) 50 MCG/ACT nasal spray Place 2 sprays into both nostrils daily. 16 g 6  . Hydrocodone-Acetaminophen (VICODIN) 5-300 MG TABS Take by mouth.    . pravastatin (PRAVACHOL) 20 MG tablet Take 1 tablet (20 mg total) by mouth daily. 90 tablet 3  . VENTOLIN HFA 108 (90 Base) MCG/ACT inhaler INHALE 1-2 PUFFS INTO LUNGS AS NEEDED 18 g 3  . warfarin (COUMADIN) 5 MG tablet 1.5 tablets daily except 1 tablets on Mondays, Wednesdays and Fridays. OR AS DIRECTED BY COUMADIN CLINIC 135 tablet 3  . sulfamethoxazole-trimethoprim (BACTRIM DS,SEPTRA DS) 800-160 MG tablet Take 1 tablet by mouth 2 (two) times daily.     No facility-administered medications prior to visit.     Allergies:  Allergies  Allergen Reactions  . Penicillins       Review of Systems: See HPI for pertinent ROS. All other ROS negative.    Physical Exam: Blood pressure 140/88, pulse 85, temperature 97.5 F (36.4 C), temperature source Oral, resp. rate  16, weight 250 lb (113.4 kg)., Body mass index is 38.01 kg/m. General: WM. Appears in no acute distress. Neck: Supple. No thyromegaly. No lymphadenopathy. Lungs: Clear bilaterally to auscultation without wheezes, rales, or rhonchi. Breathing is unlabored. Heart: Regular rhythm. No murmurs, rubs, or gallops. Msk:  Strength and tone normal for age. Extremities/Skin: Left lower leg bandaged. I did not remove this as it is managed at wound center. Neuro: Alert and oriented X 3. Moves all extremities spontaneously. Gait is normal. CNII-XII grossly in tact. Psych:  Responds to questions appropriately with a normal affect.     ASSESSMENT  AND PLAN:  70 y.o. year old male with   1. Essential hypertension Blood pressure is now controlled. Continue current medications. Check labs to monitor. - COMPLETE METABOLIC PANEL WITH GFR  2. Hyperlipidemia Says that cardiology checks cholesterol once a year but he wants to recheck today. Has been since last December since it was checked. - COMPLETE METABOLIC PANEL WITH GFR - Lipid panel  3. Prostate cancer screening - PSA, Medicare  4. BPH associated with nocturia - tamsulosin (FLOMAX) 0.4 MG CAPS capsule; Take 1 capsule (0.4 mg total) by mouth daily.  Dispense: 30 capsule; Refill: 3   Signed, [address] [place], [place], [facility] 04/03/2016 9:43 AM

## 2016-04-03 NOTE — Telephone Encounter (Signed)
Refill appropriate and filled per protocol. 

## 2016-04-05 ENCOUNTER — Inpatient Hospital Stay (HOSPITAL_COMMUNITY): Admission: RE | Admit: 2016-04-05 | Payer: Medicare Other | Source: Ambulatory Visit

## 2016-04-05 DIAGNOSIS — I87332 Chronic venous hypertension (idiopathic) with ulcer and inflammation of left lower extremity: Secondary | ICD-10-CM | POA: Diagnosis not present

## 2016-04-06 ENCOUNTER — Ambulatory Visit: Payer: Medicare Other | Admitting: Physician Assistant

## 2016-04-07 ENCOUNTER — Encounter (HOSPITAL_BASED_OUTPATIENT_CLINIC_OR_DEPARTMENT_OTHER): Payer: Medicare Other | Attending: Internal Medicine

## 2016-04-07 DIAGNOSIS — I481 Persistent atrial fibrillation: Secondary | ICD-10-CM | POA: Diagnosis not present

## 2016-04-07 DIAGNOSIS — L97822 Non-pressure chronic ulcer of other part of left lower leg with fat layer exposed: Secondary | ICD-10-CM | POA: Diagnosis not present

## 2016-04-07 DIAGNOSIS — I87332 Chronic venous hypertension (idiopathic) with ulcer and inflammation of left lower extremity: Secondary | ICD-10-CM | POA: Insufficient documentation

## 2016-04-07 DIAGNOSIS — Z86718 Personal history of other venous thrombosis and embolism: Secondary | ICD-10-CM | POA: Insufficient documentation

## 2016-04-07 DIAGNOSIS — I10 Essential (primary) hypertension: Secondary | ICD-10-CM | POA: Diagnosis not present

## 2016-04-07 DIAGNOSIS — Z87891 Personal history of nicotine dependence: Secondary | ICD-10-CM | POA: Diagnosis not present

## 2016-04-12 ENCOUNTER — Encounter (HOSPITAL_BASED_OUTPATIENT_CLINIC_OR_DEPARTMENT_OTHER): Payer: Medicare Other

## 2016-04-12 ENCOUNTER — Encounter (HOSPITAL_COMMUNITY): Payer: Medicare Other

## 2016-04-12 DIAGNOSIS — I87332 Chronic venous hypertension (idiopathic) with ulcer and inflammation of left lower extremity: Secondary | ICD-10-CM | POA: Diagnosis not present

## 2016-04-13 ENCOUNTER — Other Ambulatory Visit: Payer: Self-pay | Admitting: Family Medicine

## 2016-04-13 ENCOUNTER — Telehealth: Payer: Self-pay | Admitting: Family Medicine

## 2016-04-13 DIAGNOSIS — R7309 Other abnormal glucose: Secondary | ICD-10-CM

## 2016-04-13 DIAGNOSIS — R972 Elevated prostate specific antigen [PSA]: Secondary | ICD-10-CM

## 2016-04-13 NOTE — Telephone Encounter (Signed)
Pt aware of lab results and provider recommendations.  Coming tomorrow for A1C to be done.  Referral to Urology placed.

## 2016-04-13 NOTE — Telephone Encounter (Signed)
-----   Message from [name], PA-C sent at 04/04/2016 11:20 AM EDT ----- 1--Regarding PSA--Tell pt PSA elevated and needs f/u with Urology.---Place order for referral to Urology. 2--Regarding high glucose---tell lab to add HgbA1C Tell pt cholesterol is good--cont current cholesterol med.

## 2016-04-14 ENCOUNTER — Other Ambulatory Visit: Payer: Self-pay

## 2016-04-14 ENCOUNTER — Encounter (HOSPITAL_COMMUNITY): Payer: Medicare Other

## 2016-04-14 ENCOUNTER — Other Ambulatory Visit: Payer: Medicare Other

## 2016-04-14 DIAGNOSIS — I87332 Chronic venous hypertension (idiopathic) with ulcer and inflammation of left lower extremity: Secondary | ICD-10-CM | POA: Diagnosis not present

## 2016-04-14 DIAGNOSIS — R7309 Other abnormal glucose: Secondary | ICD-10-CM

## 2016-04-15 LAB — HEMOGLOBIN A1C
Hgb A1c MFr Bld: 6.5 % — ABNORMAL HIGH (ref <5.7)
MEAN PLASMA GLUCOSE: 140 mg/dL

## 2016-04-17 ENCOUNTER — Encounter (HOSPITAL_BASED_OUTPATIENT_CLINIC_OR_DEPARTMENT_OTHER): Payer: Medicare Other

## 2016-04-17 ENCOUNTER — Other Ambulatory Visit (HOSPITAL_BASED_OUTPATIENT_CLINIC_OR_DEPARTMENT_OTHER): Payer: Self-pay | Admitting: General Surgery

## 2016-04-17 ENCOUNTER — Ambulatory Visit (HOSPITAL_COMMUNITY)
Admission: RE | Admit: 2016-04-17 | Discharge: 2016-04-17 | Disposition: A | Discharge status: Home / Self Care | Payer: Medicare Other | Source: Ambulatory Visit | Attending: General Surgery | Admitting: General Surgery

## 2016-04-17 DIAGNOSIS — X58XXXA Exposure to other specified factors, initial encounter: Secondary | ICD-10-CM | POA: Diagnosis not present

## 2016-04-17 DIAGNOSIS — I8393 Asymptomatic varicose veins of bilateral lower extremities: Secondary | ICD-10-CM | POA: Diagnosis not present

## 2016-04-17 DIAGNOSIS — S81802A Unspecified open wound, left lower leg, initial encounter: Secondary | ICD-10-CM | POA: Insufficient documentation

## 2016-04-17 DIAGNOSIS — I87332 Chronic venous hypertension (idiopathic) with ulcer and inflammation of left lower extremity: Secondary | ICD-10-CM | POA: Diagnosis not present

## 2016-04-18 ENCOUNTER — Ambulatory Visit (HOSPITAL_COMMUNITY)
Admission: RE | Admit: 2016-04-18 | Discharge: 2016-04-18 | Disposition: A | Discharge status: Home / Self Care | Payer: Medicare Other | Source: Ambulatory Visit | Attending: General Surgery | Admitting: General Surgery

## 2016-04-18 DIAGNOSIS — I10 Essential (primary) hypertension: Secondary | ICD-10-CM | POA: Diagnosis not present

## 2016-04-18 DIAGNOSIS — E785 Hyperlipidemia, unspecified: Secondary | ICD-10-CM | POA: Insufficient documentation

## 2016-04-18 DIAGNOSIS — X58XXXA Exposure to other specified factors, initial encounter: Secondary | ICD-10-CM | POA: Insufficient documentation

## 2016-04-18 DIAGNOSIS — Z87891 Personal history of nicotine dependence: Secondary | ICD-10-CM | POA: Diagnosis not present

## 2016-04-18 DIAGNOSIS — S81802A Unspecified open wound, left lower leg, initial encounter: Secondary | ICD-10-CM | POA: Diagnosis present

## 2016-04-19 DIAGNOSIS — I87332 Chronic venous hypertension (idiopathic) with ulcer and inflammation of left lower extremity: Secondary | ICD-10-CM | POA: Diagnosis not present

## 2016-04-20 ENCOUNTER — Other Ambulatory Visit: Payer: Self-pay | Admitting: Physician Assistant

## 2016-04-21 DIAGNOSIS — I87332 Chronic venous hypertension (idiopathic) with ulcer and inflammation of left lower extremity: Secondary | ICD-10-CM | POA: Diagnosis not present

## 2016-04-26 ENCOUNTER — Other Ambulatory Visit: Payer: Self-pay | Admitting: Physician Assistant

## 2016-04-26 DIAGNOSIS — I10 Essential (primary) hypertension: Secondary | ICD-10-CM

## 2016-04-26 DIAGNOSIS — I87332 Chronic venous hypertension (idiopathic) with ulcer and inflammation of left lower extremity: Secondary | ICD-10-CM | POA: Diagnosis not present

## 2016-04-28 DIAGNOSIS — I87332 Chronic venous hypertension (idiopathic) with ulcer and inflammation of left lower extremity: Secondary | ICD-10-CM | POA: Diagnosis not present

## 2016-05-03 ENCOUNTER — Ambulatory Visit (INDEPENDENT_AMBULATORY_CARE_PROVIDER_SITE_OTHER): Payer: Medicare Other | Admitting: Pharmacist

## 2016-05-03 DIAGNOSIS — I4891 Unspecified atrial fibrillation: Secondary | ICD-10-CM | POA: Diagnosis not present

## 2016-05-03 DIAGNOSIS — Z7901 Long term (current) use of anticoagulants: Secondary | ICD-10-CM | POA: Diagnosis not present

## 2016-05-03 DIAGNOSIS — Z5181 Encounter for therapeutic drug level monitoring: Secondary | ICD-10-CM

## 2016-05-03 DIAGNOSIS — I87332 Chronic venous hypertension (idiopathic) with ulcer and inflammation of left lower extremity: Secondary | ICD-10-CM | POA: Diagnosis not present

## 2016-05-03 LAB — POCT INR: INR: 2.1

## 2016-05-10 ENCOUNTER — Encounter (HOSPITAL_BASED_OUTPATIENT_CLINIC_OR_DEPARTMENT_OTHER): Payer: Medicare Other | Attending: Surgery

## 2016-05-10 DIAGNOSIS — L97822 Non-pressure chronic ulcer of other part of left lower leg with fat layer exposed: Secondary | ICD-10-CM | POA: Insufficient documentation

## 2016-05-10 DIAGNOSIS — I87332 Chronic venous hypertension (idiopathic) with ulcer and inflammation of left lower extremity: Secondary | ICD-10-CM | POA: Insufficient documentation

## 2016-05-10 DIAGNOSIS — I481 Persistent atrial fibrillation: Secondary | ICD-10-CM | POA: Diagnosis not present

## 2016-05-10 DIAGNOSIS — Z86718 Personal history of other venous thrombosis and embolism: Secondary | ICD-10-CM | POA: Insufficient documentation

## 2016-05-10 DIAGNOSIS — I10 Essential (primary) hypertension: Secondary | ICD-10-CM | POA: Diagnosis not present

## 2016-05-12 DIAGNOSIS — I87332 Chronic venous hypertension (idiopathic) with ulcer and inflammation of left lower extremity: Secondary | ICD-10-CM | POA: Diagnosis not present

## 2016-05-17 DIAGNOSIS — I87332 Chronic venous hypertension (idiopathic) with ulcer and inflammation of left lower extremity: Secondary | ICD-10-CM | POA: Diagnosis not present

## 2016-05-18 ENCOUNTER — Encounter: Payer: Self-pay | Admitting: Vascular Surgery

## 2016-05-20 ENCOUNTER — Other Ambulatory Visit: Payer: Self-pay | Admitting: Physician Assistant

## 2016-05-23 NOTE — Telephone Encounter (Signed)
Rx refilled per protocol 

## 2016-05-24 ENCOUNTER — Encounter: Payer: Self-pay | Admitting: Vascular Surgery

## 2016-05-24 ENCOUNTER — Ambulatory Visit (INDEPENDENT_AMBULATORY_CARE_PROVIDER_SITE_OTHER): Payer: Medicare Other | Admitting: Vascular Surgery

## 2016-05-24 VITALS — BP 139/87 | HR 70 | Temp 96.9°F | Resp 18 | Ht 68.0 in | Wt 260.0 lb

## 2016-05-24 DIAGNOSIS — I87303 Chronic venous hypertension (idiopathic) without complications of bilateral lower extremity: Secondary | ICD-10-CM | POA: Diagnosis not present

## 2016-05-24 NOTE — Progress Notes (Signed)
Vascular and Vein Specialist of [facility]  Patient name: William Stanley MRN: [id number] DOB: 1946/05/21 Sex: male  REASON FOR CONSULT: Aeration of venous hypertension and left leg ulceration  HPI: William Stanley is a 70 y.o. male, who is her today for evaluation of his lower extremity venous hypertension. He has a greater than 30 year history of severe venous stasis disease. He underwent vein stripping bilaterally in the late 1980s. He had a severe complication following one of these with urinary embolus and prolonged intensive care hospitalization. I had seen him approximate 5 years ago with a severe venous ulceration. He has been extremely compliant with compression over the years. Prior studies have shown severe deep venous reflux. Not have any history of arterial insufficiency  Past Medical History:  Diagnosis Date  . Atrial fibrillation ([place])   . DVT (deep venous thrombosis) ([place])   . Edema of lower extremity   . Hypertension   . Lung collapse    LEFT LUNG, TWICE  . Pulmonary embolism ([facility])     Family History  Problem Relation Age of Onset  . Heart attack Mother   . Heart failure Mother   . Arrhythmia Father   . Heart failure Father     SOCIAL HISTORY: Social History   Social History  . Marital status: Married    Spouse name: N/A  . Number of children: N/A  . Years of education: N/A   Occupational History  . Not on file.   Social History Main Topics  . Smoking status: Former Smoker    Packs/day: 0.25    Types: Cigarettes    Quit date: 07/07/2012  . Smokeless tobacco: Never Used     Comment: pk a week  . Alcohol use No  . Drug use: No  . Sexual activity: Not on file   Other Topics Concern  . Not on file   Social History Narrative  . No narrative on file    Allergies  Allergen Reactions  . Penicillins     Current Outpatient Prescriptions  Medication Sig Dispense Refill  . amLODipine (NORVASC) 5 MG tablet  Take 1 tablet (5 mg total) by mouth daily. 180 tablet 3  . benazepril (LOTENSIN) 10 MG tablet TAKE 1 TABLET(10 MG) BY MOUTH DAILY 30 tablet 0  . fluticasone (FLONASE) 50 MCG/ACT nasal spray Place 2 sprays into both nostrils daily. 16 g 6  . pravastatin (PRAVACHOL) 20 MG tablet Take 1 tablet (20 mg total) by mouth daily. 90 tablet 3  . sulfamethoxazole-trimethoprim (BACTRIM DS,SEPTRA DS) 800-160 MG tablet Take 1 tablet by mouth 2 (two) times daily.    . tamsulosin (FLOMAX) 0.4 MG CAPS capsule TAKE 1 CAPSULE BY MOUTH EVERY DAY 90 capsule 3  . VENTOLIN HFA 108 (90 Base) MCG/ACT inhaler INHALE 1-2 PUFFS INTO LUNGS AS NEEDED 18 g 1  . warfarin (COUMADIN) 5 MG tablet 1.5 tablets daily except 1 tablets on Mondays, Wednesdays and Fridays. OR AS DIRECTED BY COUMADIN CLINIC 135 tablet 3  . doxycycline (DORYX) 100 MG EC tablet Take 100 mg by mouth 2 (two) times daily.    Marland Kitchen doxycycline (VIBRAMYCIN) 100 MG capsule TK 1 C BID  0  . Hydrocodone-Acetaminophen (VICODIN) 5-300 MG TABS Take by mouth.     No current facility-administered medications for this visit.     REVIEW OF SYSTEMS:  '[X]'$  denotes positive finding, '[ ]'$  denotes negative finding Cardiac  Comments:  Chest pain or chest pressure:     Shortness  of breath upon exertion:    Short of breath when lying flat:    Irregular heart rhythm:        Vascular    Pain in calf, thigh, or hip brought on by ambulation:    Pain in feet at night that wakes you up from your sleep:     Blood clot in your veins: x   Leg swelling:  x       Pulmonary    Oxygen at home:    Productive cough:     Wheezing:         Neurologic    Sudden weakness in arms or legs:     Sudden numbness in arms or legs:     Sudden onset of difficulty speaking or slurred speech:    Temporary loss of vision in one eye:     Problems with dizziness:         Gastrointestinal    Blood in stool:     Vomited blood:         Genitourinary    Burning when urinating:     Blood in  urine:        Psychiatric    Major depression:         Hematologic    Bleeding problems:    Problems with blood clotting too easily:        Skin    Rashes or ulcers: x       Constitutional    Fever or chills:      PHYSICAL EXAM: Vitals:   05/24/16 1028  BP: 139/87  Pulse: 70  Resp: 18  Temp: (!) 96.9 F (36.1 C)  SpO2: 96%  Weight: 260 lb (117.9 kg)  Height: '5\' 8"'$  (1.727 m)    GENERAL: The patient is a well-nourished male, in no acute distress. The vital signs are documented above. CARDIOVASCULAR: 2+ radial and 2+ dorsalis pedis pulses bilaterally PULMONARY: There is good air exchange  ABDOMEN: Soft and non-tender  MUSCULOSKELETAL: There are no major deformities or cyanosis. NEUROLOGIC: No focal weakness or paresthesias are detected. SKIN: Severe changes of chronic venous hypertension bilaterally. Circumferential hemosiderin deposits from his calves down to his ankles bilaterally. Very friable skin over his left distal calf circumferentially. PSYCHIATRIC: The patient has a normal affect.  DATA:  Venous duplex study from 04/17/2016 was reviewed and discussed with the patient. This reveals gross reflux throughout his common femoral vein and femoral vein and popliteal vein bilaterally. His great saphenous vein is surgically absent. He does have reflux in his right small saphenous vein. No evidence of DVT or obstruction  MEDICAL ISSUES: The patient healed his left leg venous ulcer. Understands critical importance of compression and elevation. Has been very compliant in wearing knee-high 30 40 mmHg compression garments. Currently his right leg is stable. No recent ulceration. He would be a candidate for ablation of his small saphenous vein should he have recurrent ulceration. I feel that the majority of this component continues to be related to the deep system. He understands this will notify should he develop any recurrent ulceration in his right leg.   Rosetta Posner, MD  FACS Vascular and Vein Specialists of [facility] Tel [phone] Pager [phone]

## 2016-05-25 ENCOUNTER — Other Ambulatory Visit: Payer: Self-pay

## 2016-05-25 NOTE — Telephone Encounter (Signed)
Rx filled 10-17

## 2016-06-09 ENCOUNTER — Telehealth (INDEPENDENT_AMBULATORY_CARE_PROVIDER_SITE_OTHER): Payer: Self-pay

## 2016-06-09 DIAGNOSIS — M25512 Pain in left shoulder: Principal | ICD-10-CM

## 2016-06-09 DIAGNOSIS — G8929 Other chronic pain: Secondary | ICD-10-CM

## 2016-06-09 NOTE — Telephone Encounter (Signed)
Need mri arthrogram pls call thx of shoulder

## 2016-06-09 NOTE — Telephone Encounter (Signed)
See call note from patient, below is your last office note, pls advise if appt or other is needed for him?  thanks    [facility]   [address] [place], [place] [address] Telephone: [phone]  Fax: [phone]     PATIENT: William, Stanley   MR#: [id number]  DOB: [birth year]   Visit Date: 02/25/2016     CHIEF COMPLAINT:  Left shoulder pain.    HISTORY OF PRESENT ILLNESS:  William Stanley is a 70 year old patient with left shoulder pain here for 8 week recheck.  Had injection subacromial space 12/24/2015 and that has helped.  Doing pretty well now.  Sleeping okay.  He is using a cane on the right hand side.  Had some type of leg surgery pending.  He is not a smoker.   REVIEW OF SYSTEMS:  All systems reviewed and negative.   PHYSICAL EXAMINATION:  Well-developed, well-nourished in no acute distress.  Alert and oriented.  Slightly increased body mass index.  Has better range of motion with less impingement on the left shoulder today.  Rotator cuff strength is intact.  Neck range of motion is good.  No masses, lymphadenopathy, skin changes noted on the left shoulder girdle region.   ASSESSMENT/DIAGNOSIS:  Left shoulder pain.   PLAN:  Left shoulder is doing well.  Decision point today was for or against MRI scanning.  I would favor observation for now.  Requested Silvadene cream for this left leg partial thickness ulceration which is provided.  He is seeing someone next week at the wound center for further management of that problem.  Followup with me as needed.   For additional information please see handwritten notes, reports, orders and prescriptions in this chart.      Anderson Malta, M.D.    Auto-Authenticated by G. Alphonzo Severance, M.D.  GSD/dr DD: 02/26/2016  DT: 02/26/2016   cc:

## 2016-06-12 NOTE — Telephone Encounter (Signed)
IC pt and LMVM advising we will order MRI and to call if he has any other questions

## 2016-06-12 NOTE — Addendum Note (Signed)
Addended byBrand Males on: 06/12/2016 01:15 PM   Modules accepted: Orders

## 2016-06-14 ENCOUNTER — Ambulatory Visit (INDEPENDENT_AMBULATORY_CARE_PROVIDER_SITE_OTHER): Payer: Medicare Other | Admitting: *Deleted

## 2016-06-14 DIAGNOSIS — I4891 Unspecified atrial fibrillation: Secondary | ICD-10-CM | POA: Diagnosis not present

## 2016-06-14 DIAGNOSIS — Z5181 Encounter for therapeutic drug level monitoring: Secondary | ICD-10-CM | POA: Diagnosis not present

## 2016-06-14 DIAGNOSIS — Z7901 Long term (current) use of anticoagulants: Secondary | ICD-10-CM | POA: Diagnosis not present

## 2016-06-14 LAB — POCT INR: INR: 1.7

## 2016-07-12 ENCOUNTER — Telehealth (INDEPENDENT_AMBULATORY_CARE_PROVIDER_SITE_OTHER): Payer: Self-pay | Admitting: *Deleted

## 2016-07-12 NOTE — Telephone Encounter (Signed)
Received a call from [facility] stating they could not due MRI arthrogram d/t pt has a venacava filter put in 1988 and pt states he does not have any information on the filter so the supervisor at [facility] is requesting a order for CT arthrogram instead. Please advise!

## 2016-07-12 NOTE — Telephone Encounter (Signed)
Can you get this changed/ordered correctly?  See Dr Randel Pigg note, thanks

## 2016-07-12 NOTE — Telephone Encounter (Signed)
Ok for this pls clal thx

## 2016-07-12 NOTE — Telephone Encounter (Signed)
See note- please advise on study?  thanks

## 2016-07-16 ENCOUNTER — Other Ambulatory Visit: Payer: Self-pay | Admitting: Physician Assistant

## 2016-07-17 ENCOUNTER — Encounter: Payer: Self-pay | Admitting: Physician Assistant

## 2016-07-17 ENCOUNTER — Other Ambulatory Visit (INDEPENDENT_AMBULATORY_CARE_PROVIDER_SITE_OTHER): Payer: Self-pay | Admitting: Orthopedic Surgery

## 2016-07-17 ENCOUNTER — Ambulatory Visit (INDEPENDENT_AMBULATORY_CARE_PROVIDER_SITE_OTHER): Payer: Medicare Other | Admitting: Physician Assistant

## 2016-07-17 VITALS — BP 140/78 | HR 75 | Temp 97.5°F | Resp 16 | Wt 255.0 lb

## 2016-07-17 DIAGNOSIS — M25512 Pain in left shoulder: Principal | ICD-10-CM

## 2016-07-17 DIAGNOSIS — J988 Other specified respiratory disorders: Secondary | ICD-10-CM | POA: Diagnosis not present

## 2016-07-17 DIAGNOSIS — Z23 Encounter for immunization: Secondary | ICD-10-CM | POA: Diagnosis not present

## 2016-07-17 DIAGNOSIS — B9789 Other viral agents as the cause of diseases classified elsewhere: Secondary | ICD-10-CM

## 2016-07-17 DIAGNOSIS — R300 Dysuria: Secondary | ICD-10-CM

## 2016-07-17 DIAGNOSIS — G8929 Other chronic pain: Secondary | ICD-10-CM

## 2016-07-17 LAB — URINALYSIS, MICROSCOPIC ONLY
CASTS: NONE SEEN [LPF]
CRYSTALS: NONE SEEN [HPF]
Yeast: NONE SEEN [HPF]

## 2016-07-17 LAB — URINALYSIS, ROUTINE W REFLEX MICROSCOPIC
Bilirubin Urine: NEGATIVE
Glucose, UA: NEGATIVE
Hgb urine dipstick: NEGATIVE
Ketones, ur: NEGATIVE
NITRITE: NEGATIVE
PH: 5.5 (ref 5.0–8.0)
Protein, ur: NEGATIVE
SPECIFIC GRAVITY, URINE: 1.025 (ref 1.001–1.035)

## 2016-07-17 MED ORDER — CETIRIZINE HCL 10 MG PO TABS: 10.0000 mg | ORAL_TABLET | Freq: Every day | ORAL | 11 refills | Status: DC

## 2016-07-17 NOTE — Telephone Encounter (Signed)
Order changed as requested.

## 2016-07-17 NOTE — Progress Notes (Signed)
HPI: FU atrial fibrillation. He also has a history of DVT and pulmonary emboli in 1988. Last echocardiogram in December of 2010 showed normal LV function. There was mild left ventricular hypertrophy. There was mild mitral regurgitation. Myoview in December of 2010 showed normal perfusion and normal LV function with an ejection fraction of 56%. Holter monitor in June of 2013 showed controlled atrial fibrillation. Abdominal ultrasound December 2015 showed no aneurysm. Since last him, the patient has dyspnea with more extreme activities but not with routine activities. It is relieved with rest. It is not associated with chest pain. There is no orthopnea, PND or pedal edema. There is no syncope or palpitations. There is no exertional chest pain.   Current Outpatient Prescriptions  Medication Sig Dispense Refill  . amLODipine (NORVASC) 5 MG tablet Take 1 tablet (5 mg total) by mouth daily. 180 tablet 3  . benazepril (LOTENSIN) 10 MG tablet TAKE 1 TABLET(10 MG) BY MOUTH DAILY 30 tablet 0  . cetirizine (ZYRTEC) 10 MG tablet Take 1 tablet (10 mg total) by mouth daily. 30 tablet 11  . fluticasone (FLONASE) 50 MCG/ACT nasal spray Place 2 sprays into both nostrils daily. 16 g 6  . latanoprost (XALATAN) 0.005 % ophthalmic solution Place 0.05 mLs into the left eye daily.   11  . pravastatin (PRAVACHOL) 20 MG tablet TAKE 1 TABLET BY MOUTH EVERY DAY 90 tablet 3  . tamsulosin (FLOMAX) 0.4 MG CAPS capsule TAKE 1 CAPSULE BY MOUTH EVERY DAY 90 capsule 3  . VENTOLIN HFA 108 (90 Base) MCG/ACT inhaler INHALE 1-2 PUFFS INTO LUNGS AS NEEDED 18 g 0  . warfarin (COUMADIN) 5 MG tablet 1.5 tablets daily except 1 tablets on Mondays, Wednesdays and Fridays. OR AS DIRECTED BY COUMADIN CLINIC 135 tablet 3   No current facility-administered medications for this visit.      Past Medical History:  Diagnosis Date  . Atrial fibrillation ([place])   . DVT (deep venous thrombosis) ([place])   . Edema of lower extremity   .  Hypertension   . Lung collapse    LEFT LUNG, TWICE  . Pulmonary embolism [facility])     Past Surgical History:  Procedure Laterality Date  . EYE SURGERY    . HERNIA REPAIR  1992  . VENOUS LIGATION SURGERY  1988    Social History   Social History  . Marital status: Married    Spouse name: N/A  . Number of children: N/A  . Years of education: N/A   Occupational History  . Not on file.   Social History Main Topics  . Smoking status: Former Smoker    Packs/day: 0.25    Types: Cigarettes    Quit date: 07/07/2012  . Smokeless tobacco: Never Used     Comment: pk a week  . Alcohol use No  . Drug use: No  . Sexual activity: Not on file   Other Topics Concern  . Not on file   Social History Narrative  . No narrative on file    Family History  Problem Relation Age of Onset  . Heart attack Mother   . Heart failure Mother   . Arrhythmia Father   . Heart failure Father     ROS: no fevers or chills, productive cough, hemoptysis, dysphasia, odynophagia, melena, hematochezia, dysuria, hematuria, rash, seizure activity, orthopnea, PND, pedal edema, claudication. Remaining systems are negative.  Physical Exam: Well-developed obese in no acute distress.  Skin is warm and dry.  HEENT is  normal.  Neck is supple.  Chest is clear to auscultation with normal expansion.  Cardiovascular exam is irregular Abdominal exam nontender or distended. No masses palpated. Extremities show no edema. neuro grossly intact  ECG-Atrial fibrillation at a rate of 78. No ST changes.  A/P  1 . Permanent atrial fibrillation-continue Coumadin. Check hemoglobin. He declines DOACs. His rate is controlled.  2 hypertension-blood pressure controlled. Continue present medications. Check potassium and renal function.  3 hyperlipidemia-continue statin. Check lipids and liver.  4 Obesity-discussed importance of weight loss  Kirk Ruths, MD

## 2016-07-17 NOTE — Progress Notes (Signed)
Patient ID: TAAJ HURLBUT MRN: [id number], DOB: 1946/01/17, 70 y.o. Date of Encounter: 07/17/2016, 9:13 AM    Chief Complaint:  Chief Complaint  Patient presents with  . burning with urination  . left ear pain  . sinus drainage     HPI: 70 y.o. year old male presetns with above.   Says that he hasn't really had any pain but just an uncomfortable feeling so wanted to check to see if he had urine infection. He has an appointment scheduled to follow-up with urology just wanted to make sure he didn't have a infection in the interim.  This past Friday 07/14/16 we had some snow. States that that afternoon they had a water leak --he was working on this-- he was out in the snow. Feels like being out in that weather is what caused the symptoms to start. After that he developed some throat irritation and some drainage in his throat. Also says at times he feels a crunchy sound in his left ear. No chest congestion. No fevers or chills. No thick dark mucus from the nose.     Home Meds:   Outpatient Medications Prior to Visit  Medication Sig Dispense Refill  . amLODipine (NORVASC) 5 MG tablet Take 1 tablet (5 mg total) by mouth daily. 180 tablet 3  . fluticasone (FLONASE) 50 MCG/ACT nasal spray Place 2 sprays into both nostrils daily. 16 g 6  . pravastatin (PRAVACHOL) 20 MG tablet Take 1 tablet (20 mg total) by mouth daily. 90 tablet 3  . VENTOLIN HFA 108 (90 Base) MCG/ACT inhaler INHALE 1-2 PUFFS INTO LUNGS AS NEEDED 18 g 1  . warfarin (COUMADIN) 5 MG tablet 1.5 tablets daily except 1 tablets on Mondays, Wednesdays and Fridays. OR AS DIRECTED BY COUMADIN CLINIC 135 tablet 3  . benazepril (LOTENSIN) 10 MG tablet TAKE 1 TABLET(10 MG) BY MOUTH DAILY (Patient not taking: Reported on 07/17/2016) 30 tablet 0  . doxycycline (DORYX) 100 MG EC tablet Take 100 mg by mouth 2 (two) times daily.    Marland Kitchen doxycycline (VIBRAMYCIN) 100 MG capsule TK 1 C BID  0  . Hydrocodone-Acetaminophen (VICODIN) 5-300 MG  TABS Take by mouth.    . sulfamethoxazole-trimethoprim (BACTRIM DS,SEPTRA DS) 800-160 MG tablet Take 1 tablet by mouth 2 (two) times daily.    . tamsulosin (FLOMAX) 0.4 MG CAPS capsule TAKE 1 CAPSULE BY MOUTH EVERY DAY (Patient not taking: Reported on 07/17/2016) 90 capsule 3   No facility-administered medications prior to visit.     Allergies:  Allergies  Allergen Reactions  . Penicillins       Review of Systems: See HPI for pertinent ROS. All other ROS negative.    Physical Exam: Blood pressure 140/78, pulse 75, temperature 97.5 F (36.4 C), temperature source Oral, resp. rate 16, weight 255 lb (115.7 kg), SpO2 97 %., Body mass index is 38.77 kg/m. General:  WM Appears in no acute distress. HEENT: Normocephalic, atraumatic, eyes without discharge, sclera non-icteric, nares are without discharge. Bilateral auditory canals clear, TM's are without perforation. Right TM is clear, normal. Left TM is dull but no erythema.  Oral cavity moist, posterior pharynx without exudate, erythema, peritonsillar abscess.  Neck: Supple. No thyromegaly. No lymphadenopathy. Lungs: Clear bilaterally to auscultation without wheezes, rales, or rhonchi. Breathing is unlabored. Heart: Regular rhythm. No murmurs, rubs, or gallops. Msk:  Strength and tone normal for age. Extremities/Skin: Warm and dry. Neuro: Alert and oriented X 3. Moves all extremities spontaneously. Gait is normal. CNII-XII  grossly in tact. Psych:  Responds to questions appropriately with a normal affect.   Results for orders placed or performed in visit on 07/17/16  Urinalysis, Routine w reflex microscopic  Result Value Ref Range   Color, Urine AMBER (A) YELLOW   APPearance CLEAR CLEAR   Specific Gravity, Urine 1.025 1.001 - 1.035   pH 5.5 5.0 - 8.0   Glucose, UA NEGATIVE NEGATIVE   Bilirubin Urine NEGATIVE NEGATIVE   Ketones, ur NEGATIVE NEGATIVE   Hgb urine dipstick NEGATIVE NEGATIVE   Protein, ur NEGATIVE NEGATIVE   Nitrite  NEGATIVE NEGATIVE   Leukocytes, UA TRACE (A) NEGATIVE  Urine Microscopic  Result Value Ref Range   WBC, UA 0-5 <=5 WBC/HPF   RBC / HPF 0-2 <=2 RBC/HPF   Squamous Epithelial / LPF 0-5 <=5 HPF   Bacteria, UA FEW (A) NONE SEEN HPF   Crystals NONE SEEN NONE SEEN HPF   Casts NONE SEEN NONE SEEN LPF   Yeast NONE SEEN NONE SEEN HPF     ASSESSMENT AND PLAN:  70 y.o. year old male with  1. Burning with urination Reassured patient that urinalysis does not show signs of infection. F/U with his appointment at urology. - Urinalysis, Routine w reflex microscopic  2. Viral respiratory infection Gastric patient that at this time I do not feel he needs antibiotic. Recommend that he start taking Ceretec daily to dry up the drainage in his throat and help with congestion behind his ears. Since significantly or if he develops fever then call. - cetirizine (ZYRTEC) 10 MG tablet; Take 1 tablet (10 mg total) by mouth daily.  Dispense: 30 tablet; Refill: [address] [place], [place], [facility] 07/17/2016 9:13 AM

## 2016-07-19 ENCOUNTER — Other Ambulatory Visit: Payer: Self-pay | Admitting: Cardiology

## 2016-07-19 NOTE — Telephone Encounter (Signed)
Rx(s) sent to pharmacy electronically.  

## 2016-07-21 ENCOUNTER — Ambulatory Visit (INDEPENDENT_AMBULATORY_CARE_PROVIDER_SITE_OTHER): Payer: Medicare Other | Admitting: Cardiology

## 2016-07-21 ENCOUNTER — Telehealth (INDEPENDENT_AMBULATORY_CARE_PROVIDER_SITE_OTHER): Payer: Self-pay | Admitting: *Deleted

## 2016-07-21 ENCOUNTER — Ambulatory Visit (INDEPENDENT_AMBULATORY_CARE_PROVIDER_SITE_OTHER): Payer: Medicare Other | Admitting: Pharmacist

## 2016-07-21 ENCOUNTER — Encounter: Payer: Self-pay | Admitting: Cardiology

## 2016-07-21 VITALS — BP 140/90 | HR 78 | Wt 258.0 lb

## 2016-07-21 DIAGNOSIS — E7800 Pure hypercholesterolemia, unspecified: Secondary | ICD-10-CM | POA: Diagnosis not present

## 2016-07-21 DIAGNOSIS — I4821 Permanent atrial fibrillation: Secondary | ICD-10-CM

## 2016-07-21 DIAGNOSIS — Z7901 Long term (current) use of anticoagulants: Secondary | ICD-10-CM | POA: Diagnosis not present

## 2016-07-21 DIAGNOSIS — Z5181 Encounter for therapeutic drug level monitoring: Secondary | ICD-10-CM

## 2016-07-21 DIAGNOSIS — I10 Essential (primary) hypertension: Secondary | ICD-10-CM

## 2016-07-21 DIAGNOSIS — I482 Chronic atrial fibrillation: Secondary | ICD-10-CM

## 2016-07-21 DIAGNOSIS — I4891 Unspecified atrial fibrillation: Secondary | ICD-10-CM | POA: Diagnosis not present

## 2016-07-21 LAB — HEPATIC FUNCTION PANEL
ALBUMIN: 4 g/dL (ref 3.6–5.1)
ALK PHOS: 46 U/L (ref 40–115)
ALT: 21 U/L (ref 9–46)
AST: 17 U/L (ref 10–35)
Bilirubin, Direct: 0.1 mg/dL (ref <=0.2)
Indirect Bilirubin: 0.5 mg/dL (ref 0.2–1.2)
TOTAL PROTEIN: 6.8 g/dL (ref 6.1–8.1)
Total Bilirubin: 0.6 mg/dL (ref 0.2–1.2)

## 2016-07-21 LAB — LIPID PANEL
CHOL/HDL RATIO: 5.1 ratio — AB (ref <5.0)
Cholesterol: 153 mg/dL (ref <200)
HDL: 30 mg/dL — AB (ref >40)
LDL CALC: 91 mg/dL (ref <100)
TRIGLYCERIDES: 158 mg/dL — AB (ref <150)
VLDL: 32 mg/dL — ABNORMAL HIGH (ref <30)

## 2016-07-21 LAB — CBC
HCT: 47.3 % (ref 38.5–50.0)
Hemoglobin: 16 g/dL (ref 13.2–17.1)
MCH: 32.1 pg (ref 27.0–33.0)
MCHC: 33.8 g/dL (ref 32.0–36.0)
MCV: 94.8 fL (ref 80.0–100.0)
MPV: 9.7 fL (ref 7.5–12.5)
PLATELETS: 262 10*3/uL (ref 140–400)
RBC: 4.99 MIL/uL (ref 4.20–5.80)
RDW: 13.8 % (ref 11.0–15.0)
WBC: 9.8 10*3/uL (ref 3.8–10.8)

## 2016-07-21 LAB — BASIC METABOLIC PANEL
BUN: 12 mg/dL (ref 7–25)
CHLORIDE: 100 mmol/L (ref 98–110)
CO2: 29 mmol/L (ref 20–31)
Calcium: 9.3 mg/dL (ref 8.6–10.3)
Creat: 1.07 mg/dL (ref 0.70–1.18)
Glucose, Bld: 117 mg/dL — ABNORMAL HIGH (ref 65–99)
POTASSIUM: 4.5 mmol/L (ref 3.5–5.3)
SODIUM: 138 mmol/L (ref 135–146)

## 2016-07-21 LAB — POCT INR: INR: 1.9

## 2016-07-21 MED ORDER — AMLODIPINE BESYLATE 5 MG PO TABS: 5.0000 mg | ORAL_TABLET | Freq: Every day | ORAL | 3 refills | Status: DC

## 2016-07-21 MED ORDER — PRAVASTATIN SODIUM 20 MG PO TABS: 20.0000 mg | ORAL_TABLET | Freq: Every day | ORAL | 3 refills | Status: DC

## 2016-07-21 MED ORDER — WARFARIN SODIUM 5 MG PO TABS: ORAL_TABLET | ORAL | 3 refills | Status: DC

## 2016-07-21 MED ORDER — BENAZEPRIL HCL 10 MG PO TABS: ORAL_TABLET | ORAL | 3 refills | Status: DC

## 2016-07-21 NOTE — Patient Instructions (Signed)
Medication Instructions:   NO CHANGE  Labwork:  Your physician recommends that you HAVE LAB WORK TODAY  Follow-Up:  Your physician wants you to follow-up in: ONE YEAR WITH DR CRENSHAW You will receive a reminder letter in the mail two months in advance. If you don't receive a letter, please call our office to schedule the follow-up appointment.   If you need a refill on your cardiac medications before your next appointment, please call your pharmacy.    

## 2016-07-21 NOTE — Telephone Encounter (Signed)
Pt scheduled at [facility] imaging Dec 20th at 830am, pt is to arrive 15 mins early to check in, left message on home machine to return my call

## 2016-07-24 ENCOUNTER — Encounter: Payer: Self-pay | Admitting: *Deleted

## 2016-07-24 ENCOUNTER — Encounter: Payer: Self-pay | Admitting: Physician Assistant

## 2016-07-24 ENCOUNTER — Ambulatory Visit (INDEPENDENT_AMBULATORY_CARE_PROVIDER_SITE_OTHER): Payer: Medicare Other | Admitting: Physician Assistant

## 2016-07-24 VITALS — BP 130/80 | HR 74 | Temp 97.9°F | Resp 16 | Wt 253.0 lb

## 2016-07-24 DIAGNOSIS — I4891 Unspecified atrial fibrillation: Secondary | ICD-10-CM

## 2016-07-24 DIAGNOSIS — E785 Hyperlipidemia, unspecified: Secondary | ICD-10-CM | POA: Diagnosis not present

## 2016-07-24 DIAGNOSIS — R351 Nocturia: Secondary | ICD-10-CM

## 2016-07-24 DIAGNOSIS — N401 Enlarged prostate with lower urinary tract symptoms: Secondary | ICD-10-CM

## 2016-07-24 DIAGNOSIS — R739 Hyperglycemia, unspecified: Secondary | ICD-10-CM | POA: Diagnosis not present

## 2016-07-24 DIAGNOSIS — I10 Essential (primary) hypertension: Secondary | ICD-10-CM

## 2016-07-24 LAB — HEMOGLOBIN A1C, FINGERSTICK: Hgb A1C (fingerstick): 6.7 % — ABNORMAL HIGH (ref <5.7)

## 2016-07-24 MED ORDER — BENAZEPRIL HCL 10 MG PO TABS: ORAL_TABLET | ORAL | 1 refills | Status: DC

## 2016-07-24 MED ORDER — TAMSULOSIN HCL 0.4 MG PO CAPS: 0.4000 mg | ORAL_CAPSULE | Freq: Every day | ORAL | 1 refills | Status: AC

## 2016-07-24 MED ORDER — ALBUTEROL SULFATE HFA 108 (90 BASE) MCG/ACT IN AERS: INHALATION_SPRAY | RESPIRATORY_TRACT | 1 refills | Status: DC

## 2016-07-24 NOTE — Progress Notes (Signed)
Patient ID: William Stanley MRN: [id number], DOB: 05-02-1946, 70 y.o. Date of Encounter: 07/24/2016, 11:00 AM    Chief Complaint:  Chief Complaint  Patient presents with  . hyperglycemia     HPI: 70 y.o. year old male presents with above.    03/23/2016: He recently called here reporting that when he had gone to the wound clinic recently his blood pressure was high and he was concerned. Therefore, he was scheduled visit for today and we did get a fax from the wound center with some recent blood pressure readings there. 03/15/2016 at 8:00 AM blood pressure reading at the wound center was 162/98. 03/22/2016 at 9:15 AM at the wound Center blood pressure reading was 160/88.  He has had no other blood pressure checks elsewhere to know how his blood pressure has been running otherwise----since LOV with me--12/09/2015.  I did review his anticoagulation visits but they do not check blood pressures at those visits.  He is taking current blood pressure medication Norvasc 5 mg daily as directed.  He is going to the wound center secondary to wound on lower leg from venous stasis ulcer.  Today he states that he also has been having some nasal congestion last few days but not as bad as it usually is in the past. Blowing nothing out of his nose. No chest congestion or cough. No sore throat or earache fevers or chills.  No other complaints or concerns.  AT THAT OV: Added benazepril '10mg'$  QD.   04/03/2016: States that he is taking the benazepril 10 mg daily in addition to his other medicines.  He checked his blood pressure and pulse on one occasion and brought in his readings. Blood Pressure was 127/71.  Pulse was 72. He states that since he knew he had to do blood work today, he did come fasting so that we can check his cholesterol as well.  Also wants to check PSA. Says he wakes up 3 or 4 times a night to urinate. Wants a medication to help with that. No other complaints or  concerns.  07/24/2016: He continues to take benazepril daily with no adverse effects. Reviewed that when PSA was checked 04/03/16 came back elevated at 5.3 several recommended referral to urology. He has appointment scheduled to see urology January 4. At visit 04/03/16 he was having nocturia as we checked the PSA and also added Flomax. Days he is taking the Flomax and it has helped those symptoms. He does plan to follow-up with the urology appointment and is aware that he needs to do so. Notes that he just recently had labs drawn by cardiology 07/11/16 and I have reviewed these today. He had lipid panel CMET CBC at that time. Last A1c was 04/14/2016---6.5 so will need to repeat this today.  Home Meds:   Outpatient Medications Prior to Visit  Medication Sig Dispense Refill  . amLODipine (NORVASC) 5 MG tablet Take 1 tablet (5 mg total) by mouth daily. 90 tablet 3  . cetirizine (ZYRTEC) 10 MG tablet Take 1 tablet (10 mg total) by mouth daily. 30 tablet 11  . fluticasone (FLONASE) 50 MCG/ACT nasal spray Place 2 sprays into both nostrils daily. 16 g 6  . latanoprost (XALATAN) 0.005 % ophthalmic solution Place 0.05 mLs into the left eye daily.   11  . pravastatin (PRAVACHOL) 20 MG tablet Take 1 tablet (20 mg total) by mouth daily. 90 tablet 3  . warfarin (COUMADIN) 5 MG tablet 1.5 tablets daily except 1 tablets  on Mondays, Wednesdays and Fridays. OR AS DIRECTED BY COUMADIN CLINIC 135 tablet 3  . benazepril (LOTENSIN) 10 MG tablet TAKE 1 TABLET(10 MG) BY MOUTH DAILY 90 tablet 3  . tamsulosin (FLOMAX) 0.4 MG CAPS capsule TAKE 1 CAPSULE BY MOUTH EVERY DAY 90 capsule 3  . VENTOLIN HFA 108 (90 Base) MCG/ACT inhaler INHALE 1-2 PUFFS INTO LUNGS AS NEEDED 18 g 0   No facility-administered medications prior to visit.     Allergies:  Allergies  Allergen Reactions  . Penicillins       Review of Systems: See HPI for pertinent ROS. All other ROS negative.    Physical Exam: Blood pressure 130/80, pulse  74, temperature 97.9 F (36.6 C), temperature source Oral, resp. rate 16, weight 253 lb (114.8 kg), SpO2 97 %., Body mass index is 38.47 kg/m. General: WM. Appears in no acute distress. Neck: Supple. No thyromegaly. No lymphadenopathy. Lungs: Clear bilaterally to auscultation without wheezes, rales, or rhonchi. Breathing is unlabored. Heart: Regular rhythm. No murmurs, rubs, or gallops. Msk:  Strength and tone normal for age. Extremities/Skin:  No LE edema. Neuro: Alert and oriented X 3. Moves all extremities spontaneously. Gait is normal. CNII-XII grossly in tact. Psych:  Responds to questions appropriately with a normal affect.     ASSESSMENT AND PLAN:  70 y.o. year old male with   1. Essential hypertension 07/24/2016: Blood pressure is now controlled. Continue current medications.            CMET was checked 07/11/16 and was stable 2. Hyperlipidemia 07/24/2016: Cardiology checked lipid panel and liver 07/11/16. He is continuing current dose of pravastatin.  3. Prostate cancer screening 07/24/2016: PSA was checked 04/03/16 and came back elevated at 5.3. At that time we placed order for referral to urology. He has his appointment scheduled for January 4. He plans to follow up with that             appointment.  4. BPH associated with nocturia 07/24/2016:  he states that symptoms are improved with Flomax. Continue Flomax. Follow-up with urology appointment see #3 above. - tamsulosin (FLOMAX) 0.4 MG CAPS capsule; Take 1 capsule (0.4 mg total) by mouth daily.  Dispense: 30 capsule; Refill: 3   Signed, [address] [place], [place], [facility] 07/24/2016 11:00 AM

## 2016-07-24 NOTE — Telephone Encounter (Signed)
Pt called back and aware of appt 

## 2016-07-26 ENCOUNTER — Other Ambulatory Visit: Payer: Medicare Other

## 2016-07-26 ENCOUNTER — Ambulatory Visit
Admission: RE | Admit: 2016-07-26 | Discharge: 2016-07-26 | Disposition: A | Discharge status: Home / Self Care | Payer: Medicare Other | Source: Ambulatory Visit | Attending: Orthopedic Surgery | Admitting: Orthopedic Surgery

## 2016-07-26 ENCOUNTER — Telehealth: Payer: Self-pay | Admitting: Cardiology

## 2016-07-26 DIAGNOSIS — G8929 Other chronic pain: Secondary | ICD-10-CM

## 2016-07-26 DIAGNOSIS — M25512 Pain in left shoulder: Principal | ICD-10-CM

## 2016-07-26 NOTE — Telephone Encounter (Signed)
New message       Request for surgical clearance:  What type of surgery is being performed?  arthrogram When is this surgery scheduled?  08-04-16 Are there any medications that need to be held prior to surgery and how long? Stop coumadin 4 days prior and will need inr drawn day of procedure 1. Name of physician performing surgery?   What is your office phone and fax number? Fax [phone] or put note in epic

## 2016-07-26 NOTE — Telephone Encounter (Signed)
Hold coumadin 4 days prior to procedure and resume day of. Brian Crenshaw  

## 2016-07-26 NOTE — Telephone Encounter (Signed)
Will forward for dr crenshaw review  

## 2016-07-26 NOTE — Telephone Encounter (Signed)
Will fax this note to the number provided. 

## 2016-07-27 ENCOUNTER — Other Ambulatory Visit: Payer: Self-pay | Admitting: Cardiology

## 2016-08-04 ENCOUNTER — Ambulatory Visit
Admission: RE | Admit: 2016-08-04 | Discharge: 2016-08-04 | Disposition: A | Discharge status: Home / Self Care | Payer: Medicare Other | Source: Ambulatory Visit | Attending: Orthopedic Surgery | Admitting: Orthopedic Surgery

## 2016-08-04 ENCOUNTER — Ambulatory Visit (INDEPENDENT_AMBULATORY_CARE_PROVIDER_SITE_OTHER): Payer: Medicare Other

## 2016-08-04 DIAGNOSIS — Z5181 Encounter for therapeutic drug level monitoring: Secondary | ICD-10-CM

## 2016-08-04 DIAGNOSIS — Z7901 Long term (current) use of anticoagulants: Secondary | ICD-10-CM | POA: Diagnosis not present

## 2016-08-04 DIAGNOSIS — G8929 Other chronic pain: Secondary | ICD-10-CM

## 2016-08-04 DIAGNOSIS — I4891 Unspecified atrial fibrillation: Secondary | ICD-10-CM | POA: Diagnosis not present

## 2016-08-04 DIAGNOSIS — M25512 Pain in left shoulder: Principal | ICD-10-CM

## 2016-08-04 LAB — POCT INR: INR: 1.1

## 2016-08-04 MED ORDER — IOPAMIDOL (ISOVUE-M 200) INJECTION 41%
15.0000 mL | Freq: Once | INTRAMUSCULAR | Status: AC
  Administered 2016-08-04: 15 mL via INTRA_ARTICULAR

## 2016-08-10 DIAGNOSIS — N401 Enlarged prostate with lower urinary tract symptoms: Secondary | ICD-10-CM | POA: Diagnosis not present

## 2016-08-10 DIAGNOSIS — R351 Nocturia: Secondary | ICD-10-CM | POA: Diagnosis not present

## 2016-08-10 DIAGNOSIS — R972 Elevated prostate specific antigen [PSA]: Secondary | ICD-10-CM | POA: Diagnosis not present

## 2016-08-14 ENCOUNTER — Ambulatory Visit (INDEPENDENT_AMBULATORY_CARE_PROVIDER_SITE_OTHER): Payer: Medicare HMO | Admitting: *Deleted

## 2016-08-14 DIAGNOSIS — Z7901 Long term (current) use of anticoagulants: Secondary | ICD-10-CM

## 2016-08-14 DIAGNOSIS — I4891 Unspecified atrial fibrillation: Secondary | ICD-10-CM

## 2016-08-14 DIAGNOSIS — Z5181 Encounter for therapeutic drug level monitoring: Secondary | ICD-10-CM | POA: Diagnosis not present

## 2016-08-14 LAB — POCT INR: INR: 1.9

## 2016-08-18 ENCOUNTER — Telehealth (INDEPENDENT_AMBULATORY_CARE_PROVIDER_SITE_OTHER): Payer: Self-pay | Admitting: *Deleted

## 2016-08-18 NOTE — Telephone Encounter (Signed)
No rotator cuff tear no bursitis I can call him later but if you can give him the preliminary results and then send the message back to me .  There is not a surgical indication this shoulder at this time

## 2016-08-18 NOTE — Telephone Encounter (Signed)
IC patient and advised your note.  He is having a lot of pain still, please call him to discuss all this.  Thanks

## 2016-08-18 NOTE — Telephone Encounter (Signed)
Please call him to set up an appointment for an injection sometime in the near future thank you

## 2016-08-18 NOTE — Telephone Encounter (Signed)
Pt called asking about CT results.

## 2016-08-18 NOTE — Telephone Encounter (Signed)
See note, pt does not have an appt for review.

## 2016-08-21 NOTE — Telephone Encounter (Signed)
Can you call and do this?  See Dean's note.

## 2016-08-22 ENCOUNTER — Ambulatory Visit (INDEPENDENT_AMBULATORY_CARE_PROVIDER_SITE_OTHER): Payer: Medicare HMO | Admitting: Orthopedic Surgery

## 2016-08-22 ENCOUNTER — Encounter (INDEPENDENT_AMBULATORY_CARE_PROVIDER_SITE_OTHER): Payer: Self-pay | Admitting: Orthopedic Surgery

## 2016-08-22 DIAGNOSIS — M7552 Bursitis of left shoulder: Secondary | ICD-10-CM | POA: Diagnosis not present

## 2016-08-22 MED ORDER — LIDOCAINE HCL 1 % IJ SOLN
5.0000 mL | INTRAMUSCULAR | Status: AC | PRN
  Administered 2016-08-22: 5 mL

## 2016-08-22 MED ORDER — BUPIVACAINE HCL 0.5 % IJ SOLN
9.0000 mL | INTRAMUSCULAR | Status: AC | PRN
  Administered 2016-08-22: 9 mL via INTRA_ARTICULAR

## 2016-08-22 MED ORDER — METHYLPREDNISOLONE ACETATE 40 MG/ML IJ SUSP
40.0000 mg | INTRAMUSCULAR | Status: AC | PRN
  Administered 2016-08-22: 40 mg via INTRA_ARTICULAR

## 2016-08-22 NOTE — Progress Notes (Signed)
Office Visit Note   Patient: William Stanley           Date of Birth: June 25, 1946           MRN: [id number] Visit Date: 08/22/2016 Requested by: [name], PA-C [place], [place] [address] PCP: Karis Juba, PA-C  Subjective: Chief Complaint  Patient presents with  . Left Shoulder - Pain    HPI Jenny Reichmann is a 71 year old patient with left shoulder pain.  Since of seeing his had a CT arthrogram.  At scan showed intact rotator cuff intact biceps tendon mild lateral downsloping of the acromion mild degenerative changes of the before meals joint.  Reports continued pain in the deltoid region.  Not much in the way of radicular symptoms down the left arm.  He is taking some over-the-counter medication for his symptoms.              Review of Systems All systems reviewed are negative as they relate to the chief complaint within the history of present illness.  Patient denies  fevers or chills.    Assessment & Plan: Visit Diagnoses: No diagnosis found.  Plan: Impression is left shoulder bursitis with no rotator cuff tear.  Plan injection today that has helped him in the past.  I don't think he has an operative indication at this time.  Not much in the way of arthritis present in the glenohumeral joint.  I'll see him back as needed.  Follow-Up Instructions: Return if symptoms worsen or fail to improve.   Orders:  No orders of the defined types were placed in this encounter.  No orders of the defined types were placed in this encounter.     Procedures: Large Joint Inj Date/Time: 08/22/2016 10:02 AM Performed by: Meredith Pel Authorized by: Meredith Pel   Consent Given by:  Patient Site marked: the procedure site was marked   Timeout: prior to procedure the correct patient, procedure, and site was verified   Indications:  Pain and diagnostic evaluation Location:  Shoulder Site:  L subacromial bursa Prep: patient was prepped and draped in usual  sterile fashion   Needle Size:  18 G Needle Length:  1.5 inches Approach:  Posterior Ultrasound Guidance: No   Fluoroscopic Guidance: No   Arthrogram: No   Medications:  5 mL lidocaine 1 %; 9 mL bupivacaine 0.5 %; 40 mg methylPREDNISolone acetate 40 MG/ML Aspiration Attempted: No   Patient tolerance:  Patient tolerated the procedure well with no immediate complications     Clinical Data: No additional findings.  Objective: Vital Signs: There were no vitals taken for this visit.  Physical Exam   Constitutional: Patient appears well-developed HEENT:  Head: Normocephalic Eyes:EOM are normal Neck: Normal range of motion Cardiovascular: Normal rate Pulmonary/chest: Effort normal Neurologic: Patient is alert Skin: Skin is warm Psychiatric: Patient has normal mood and affect    Ortho Exam examination of the left shoulder demonstrates some pain from the patient with abduction greater than 90.  Impingement signs are positive on the left negative on the right.  Cuff strength is intact interspace stress subscap muscle testing.  No course grinding or crepitus with internal/external rotation of the arm at 15 or 90 of abduction.  Motor sensory function to the hand is intact.  Specialty Comments:  No specialty comments available.  Imaging: No results found.   PMFS History: Patient Active Problem List   Diagnosis Date Noted  . Hyperglycemia 08/05/2014  .  Encounter for therapeutic drug monitoring 09/01/2013  . Bruit 06/16/2013  . Venous stasis ulcer ([place]) 01/14/2013  . Hypertension 12/12/2011  . Hyperlipidemia 12/12/2011  . Tobacco abuse 12/12/2011  . Warfarin anticoagulation 01/05/2011  . Atrial fibrillation ([place]) 10/24/2010   Past Medical History:  Diagnosis Date  . Atrial fibrillation ([place])   . DVT (deep venous thrombosis) ([place])   . Edema of lower extremity   . Hypertension   . Lung collapse    LEFT LUNG, TWICE  . Pulmonary embolism ([facility])     Family History    Problem Relation Age of Onset  . Heart attack Mother   . Heart failure Mother   . Arrhythmia Father   . Heart failure Father     Past Surgical History:  Procedure Laterality Date  . EYE SURGERY    . HERNIA REPAIR  1992  . VENOUS LIGATION SURGERY  1988   Social History   Occupational History  . Not on file.   Social History Main Topics  . Smoking status: Former Smoker    Packs/day: 0.25    Types: Cigarettes    Quit date: 07/07/2012  . Smokeless tobacco: Never Used     Comment: pk a week  . Alcohol use No  . Drug use: No  . Sexual activity: Not on file

## 2016-08-28 ENCOUNTER — Ambulatory Visit (INDEPENDENT_AMBULATORY_CARE_PROVIDER_SITE_OTHER): Payer: Medicare HMO | Admitting: *Deleted

## 2016-08-28 DIAGNOSIS — Z5181 Encounter for therapeutic drug level monitoring: Secondary | ICD-10-CM

## 2016-08-28 DIAGNOSIS — I4891 Unspecified atrial fibrillation: Secondary | ICD-10-CM | POA: Diagnosis not present

## 2016-08-28 DIAGNOSIS — Z7901 Long term (current) use of anticoagulants: Secondary | ICD-10-CM | POA: Diagnosis not present

## 2016-08-28 LAB — POCT INR: INR: 2.4

## 2016-09-14 DIAGNOSIS — H401121 Primary open-angle glaucoma, left eye, mild stage: Secondary | ICD-10-CM | POA: Diagnosis not present

## 2016-09-16 ENCOUNTER — Other Ambulatory Visit: Payer: Self-pay | Admitting: Physician Assistant

## 2016-09-18 ENCOUNTER — Ambulatory Visit (INDEPENDENT_AMBULATORY_CARE_PROVIDER_SITE_OTHER): Payer: Medicare HMO | Admitting: Pharmacist

## 2016-09-18 DIAGNOSIS — Z7901 Long term (current) use of anticoagulants: Secondary | ICD-10-CM

## 2016-09-18 DIAGNOSIS — I4891 Unspecified atrial fibrillation: Secondary | ICD-10-CM

## 2016-09-18 DIAGNOSIS — Z5181 Encounter for therapeutic drug level monitoring: Secondary | ICD-10-CM

## 2016-09-18 LAB — POCT INR: INR: 2.8

## 2016-09-18 NOTE — Telephone Encounter (Signed)
Refill appropriate 

## 2016-10-16 ENCOUNTER — Encounter (INDEPENDENT_AMBULATORY_CARE_PROVIDER_SITE_OTHER): Payer: Self-pay

## 2016-10-16 ENCOUNTER — Ambulatory Visit (INDEPENDENT_AMBULATORY_CARE_PROVIDER_SITE_OTHER): Payer: Medicare HMO | Admitting: Physician Assistant

## 2016-10-16 ENCOUNTER — Ambulatory Visit (INDEPENDENT_AMBULATORY_CARE_PROVIDER_SITE_OTHER): Payer: Medicare HMO

## 2016-10-16 ENCOUNTER — Encounter: Payer: Self-pay | Admitting: Physician Assistant

## 2016-10-16 VITALS — BP 138/82 | HR 67 | Temp 97.7°F | Resp 16 | Wt 264.8 lb

## 2016-10-16 DIAGNOSIS — J441 Chronic obstructive pulmonary disease with (acute) exacerbation: Secondary | ICD-10-CM | POA: Diagnosis not present

## 2016-10-16 DIAGNOSIS — Z5181 Encounter for therapeutic drug level monitoring: Secondary | ICD-10-CM | POA: Diagnosis not present

## 2016-10-16 DIAGNOSIS — I4891 Unspecified atrial fibrillation: Secondary | ICD-10-CM

## 2016-10-16 DIAGNOSIS — Z7901 Long term (current) use of anticoagulants: Secondary | ICD-10-CM

## 2016-10-16 DIAGNOSIS — B9789 Other viral agents as the cause of diseases classified elsewhere: Secondary | ICD-10-CM | POA: Diagnosis not present

## 2016-10-16 DIAGNOSIS — J988 Other specified respiratory disorders: Secondary | ICD-10-CM

## 2016-10-16 LAB — POCT INR: INR: 3.1

## 2016-10-16 MED ORDER — PREDNISONE 20 MG PO TABS: ORAL_TABLET | ORAL | 0 refills | Status: DC

## 2016-10-16 NOTE — Progress Notes (Signed)
Patient ID: HELEN CUFF MRN: [id number], DOB: 08-May-1946, 71 y.o. Date of Encounter: 10/16/2016, 4:55 PM    Chief Complaint:  Chief Complaint  Patient presents with  . left and right ear pain    x4days  . Sinusitis     HPI: 71 y.o. year old male states that these symptoms started last Tuesday - Wednesday--- 5-6 days ago. Says "whenever the weather changes like that, it causes these problems---we had those days in the 70s followed by temp 30 degrees lower the next day."  "Between the temperature changes and the trees blooming---" Says this causes him to have wheezing. Also "started hearing that crackling sound in my ear like it did when I told you before" (reviewed OV note 07/17/2016---Rxed Zyrtec)  Says he started back taking Zyrtec when these symptoms reoccurred. Head also feels congested. When blose nose, gets out clear drainage.  No thick, dark mucus.  Quit smoking ~ 1.5 years ago.      Home Meds:   Outpatient Medications Prior to Visit  Medication Sig Dispense Refill  . albuterol (VENTOLIN HFA) 108 (90 Base) MCG/ACT inhaler INHALE 1-2 PUFFS INTO LUNGS Every 4 hours AS NEEDED 18 g 1  . amLODipine (NORVASC) 5 MG tablet Take 1 tablet (5 mg total) by mouth daily. 90 tablet 3  . benazepril (LOTENSIN) 10 MG tablet TAKE 1 TABLET(10 MG) BY MOUTH DAILY 90 tablet 1  . cetirizine (ZYRTEC) 10 MG tablet Take 1 tablet (10 mg total) by mouth daily. 30 tablet 11  . fluticasone (FLONASE) 50 MCG/ACT nasal spray Place 2 sprays into both nostrils daily. 16 g 6  . pravastatin (PRAVACHOL) 20 MG tablet Take 1 tablet (20 mg total) by mouth daily. 90 tablet 3  . tamsulosin (FLOMAX) 0.4 MG CAPS capsule Take 1 capsule (0.4 mg total) by mouth daily. 90 capsule 1  . VENTOLIN HFA 108 (90 Base) MCG/ACT inhaler INHALE 1-2 PUFFS INTO LUNGS AS NEEDED 18 g 2  . warfarin (COUMADIN) 5 MG tablet 1.5 tablets daily except 1 tablets on Mondays, Wednesdays and Fridays. OR AS DIRECTED BY COUMADIN CLINIC 135  tablet 3  . latanoprost (XALATAN) 0.005 % ophthalmic solution Place 0.05 mLs into the left eye daily.   11   No facility-administered medications prior to visit.     Allergies:  Allergies  Allergen Reactions  . Penicillins       Review of Systems: See HPI for pertinent ROS. All other ROS negative.    Physical Exam: Blood pressure 138/82, pulse 67, temperature 97.7 F (36.5 C), temperature source Oral, resp. rate 16, weight 264 lb 12.8 oz (120.1 kg), SpO2 98 %., Body mass index is 40.26 kg/m. General:  Obese WM. Appears in no acute distress. HEENT: Normocephalic, atraumatic, eyes without discharge, sclera non-icteric, nares are without discharge. Bilateral auditory canals clear, TM's are without perforation, pearly grey and translucent with reflective cone of light bilaterally. Oral cavity moist, posterior pharynx without exudate, erythema, peritonsillar abscess. Minimal tenderness with percussion to frontal and maxillary sinuses bilaterally.  Neck: Supple. No thyromegaly. No lymphadenopathy. Lungs: Clear bilaterally to auscultation without wheezes, rales, or rhonchi. Breathing is unlabored. Heart: Regular rhythm. No murmurs, rubs, or gallops. Msk:  Strength and tone normal for age. Extremities/Skin: Warm and dry. Neuro: Alert and oriented X 3. Moves all extremities spontaneously. Gait is normal. CNII-XII grossly in tact. Psych:  Responds to questions appropriately with a normal affect.     ASSESSMENT AND PLAN:  71 y.o. year old male  with  1. COPD exacerbation (HCC) Cont Zyrtec. Add Prednisone taper. F/U if symptoms do not resolve with this.  (Could consider adding Singulair in future) - predniSONE (DELTASONE) 20 MG tablet; Take 2 daily for 3 days then take 1 daily for 2 days  Dispense: 8 tablet; Refill: 0  2. Allergic Rhinusinusitis  - predniSONE (DELTASONE) 20 MG tablet; Take 2 daily for 3 days then take 1 daily for 2 days  Dispense: 8 tablet; Refill: 0   Signed, [address] [place], [place], [facility] 10/16/2016 4:55 PM

## 2016-10-23 ENCOUNTER — Ambulatory Visit (INDEPENDENT_AMBULATORY_CARE_PROVIDER_SITE_OTHER): Payer: Medicare HMO | Admitting: Physician Assistant

## 2016-10-23 VITALS — BP 132/88 | HR 80 | Temp 97.5°F | Resp 20 | Wt 268.0 lb

## 2016-10-23 DIAGNOSIS — I4891 Unspecified atrial fibrillation: Secondary | ICD-10-CM

## 2016-10-23 DIAGNOSIS — R739 Hyperglycemia, unspecified: Secondary | ICD-10-CM | POA: Diagnosis not present

## 2016-10-23 DIAGNOSIS — J209 Acute bronchitis, unspecified: Secondary | ICD-10-CM

## 2016-10-23 DIAGNOSIS — R972 Elevated prostate specific antigen [PSA]: Secondary | ICD-10-CM | POA: Insufficient documentation

## 2016-10-23 DIAGNOSIS — I10 Essential (primary) hypertension: Secondary | ICD-10-CM | POA: Diagnosis not present

## 2016-10-23 DIAGNOSIS — J0100 Acute maxillary sinusitis, unspecified: Secondary | ICD-10-CM | POA: Diagnosis not present

## 2016-10-23 DIAGNOSIS — E785 Hyperlipidemia, unspecified: Secondary | ICD-10-CM

## 2016-10-23 DIAGNOSIS — J440 Chronic obstructive pulmonary disease with acute lower respiratory infection: Secondary | ICD-10-CM | POA: Diagnosis not present

## 2016-10-23 DIAGNOSIS — Z720 Tobacco use: Secondary | ICD-10-CM

## 2016-10-23 LAB — HEMOGLOBIN A1C, FINGERSTICK: Hgb A1C (fingerstick): 7.1 % — ABNORMAL HIGH (ref <5.7)

## 2016-10-23 MED ORDER — LEVOFLOXACIN 750 MG PO TABS: 750.0000 mg | ORAL_TABLET | Freq: Every day | ORAL | 0 refills | Status: DC

## 2016-10-23 MED ORDER — PREDNISONE 20 MG PO TABS: ORAL_TABLET | ORAL | 0 refills | Status: DC

## 2016-10-23 NOTE — Progress Notes (Signed)
Patient ID: William Stanley MRN: [id number], DOB: 10-24-1945, 71 y.o. Date of Encounter: 10/23/2016, 8:11 AM    Chief Complaint:  No chief complaint on file.    HPI: 71 y.o. year old male presents with above.    03/23/2016: He recently called here reporting that when he had gone to the wound clinic recently his blood pressure was high and he was concerned. Therefore, he was scheduled visit for today and we did get a fax from the wound center with some recent blood pressure readings there. 03/15/2016 at 8:00 AM blood pressure reading at the wound center was 162/98. 03/22/2016 at 9:15 AM at the wound Center blood pressure reading was 160/88.  He has had no other blood pressure checks elsewhere to know how his blood pressure has been running otherwise----since LOV with me--12/09/2015.  I did review his anticoagulation visits but they do not check blood pressures at those visits.  He is taking current blood pressure medication Norvasc 5 mg daily as directed.  He is going to the wound center secondary to wound on lower leg from venous stasis ulcer.  Today he states that he also has been having some nasal congestion last few days but not as bad as it usually is in the past. Blowing nothing out of his nose. No chest congestion or cough. No sore throat or earache fevers or chills.  No other complaints or concerns.  AT THAT OV: Added benazepril '10mg'$  QD.   04/03/2016: States that he is taking the benazepril 10 mg daily in addition to his other medicines.  He checked his blood pressure and pulse on one occasion and brought in his readings. Blood Pressure was 127/71.  Pulse was 72. He states that since he knew he had to do blood work today, he did come fasting so that we can check his cholesterol as well.  Also wants to check PSA. Says he wakes up 3 or 4 times a night to urinate. Wants a medication to help with that. No other complaints or concerns.  07/24/2016: He continues to take  benazepril daily with no adverse effects. Reviewed that when PSA was checked 04/03/16 came back elevated at 5.3 several recommended referral to urology. He has appointment scheduled to see urology January 4. At visit 04/03/16 he was having nocturia as we checked the PSA and also added Flomax. Days he is taking the Flomax and it has helped those symptoms. He does plan to follow-up with the urology appointment and is aware that he needs to do so. Notes that he just recently had labs drawn by cardiology 07/11/16 and I have reviewed these today. He had lipid panel CMET CBC at that time. Last A1c was 04/14/2016---6.5 so will need to repeat this today.  10/23/2016:  He is here for routine ffice visit to follow-up Hyperglycemia/ A1c. He does have routine eye exam. Says he recently had eye surgery. Last eye exam December 2017. Also he says that he is having recurrent pressure in his face and congestion. I reviewed his last office note from 10/16/16. At that time he said that because of recent weather changes and trees blooming he was noticing some wheezing and also some crackling in his ear and some sinus pressure. At that visit -- restarted his Zyrtec and gave him prednisone. He says that while he was on the prednisone he was better but then the symptoms have returned. Having pressure in his face and the crackling sound in his ears again. States that  he has had follow-up with urology. Says that so far "everything checked out okay but he is supposed to go back for another lab and then a follow-up appointment with the doctor here in the next couple months. He continues to see Cardiology routinely. Reviewed that they checked lipid panel, bmet, LFTs on 07/21/16 and they check INR routinely.  Home Meds:   Outpatient Medications Prior to Visit  Medication Sig Dispense Refill  . albuterol (VENTOLIN HFA) 108 (90 Base) MCG/ACT inhaler INHALE 1-2 PUFFS INTO LUNGS Every 4 hours AS NEEDED 18 g 1  . amLODipine (NORVASC) 5 MG  tablet Take 1 tablet (5 mg total) by mouth daily. 90 tablet 3  . benazepril (LOTENSIN) 10 MG tablet TAKE 1 TABLET(10 MG) BY MOUTH DAILY 90 tablet 1  . cetirizine (ZYRTEC) 10 MG tablet Take 1 tablet (10 mg total) by mouth daily. 30 tablet 11  . fluticasone (FLONASE) 50 MCG/ACT nasal spray Place 2 sprays into both nostrils daily. 16 g 6  . pravastatin (PRAVACHOL) 20 MG tablet Take 1 tablet (20 mg total) by mouth daily. 90 tablet 3  . predniSONE (DELTASONE) 20 MG tablet Take 2 daily for 3 days then take 1 daily for 2 days 8 tablet 0  . tamsulosin (FLOMAX) 0.4 MG CAPS capsule Take 1 capsule (0.4 mg total) by mouth daily. 90 capsule 1  . VENTOLIN HFA 108 (90 Base) MCG/ACT inhaler INHALE 1-2 PUFFS INTO LUNGS AS NEEDED 18 g 2  . warfarin (COUMADIN) 5 MG tablet 1.5 tablets daily except 1 tablets on Mondays, Wednesdays and Fridays. OR AS DIRECTED BY COUMADIN CLINIC 135 tablet 3   No facility-administered medications prior to visit.     Allergies:  Allergies  Allergen Reactions  . Penicillins       Review of Systems: See HPI for pertinent ROS. All other ROS negative.    Physical Exam: Blood pressure 132/88, pulse 80, temperature 97.5 F (36.4 C), resp. rate 20, weight 268 lb (121.6 kg)., There is no height or weight on file to calculate BMI. General: Obese WM. Appears in no acute distress. HEENT: Positive tenderness with percussion to frontal and maxillary sinuses.  Pharynx normal with no erythema or exudate.  Neck: Supple. No thyromegaly. No lymphadenopathy. Lungs: Mild wheeze scattered throughout bilaterally Heart: Irregular rhythm. No murmurs, rubs Msk:  Strength and tone normal for age. Extremities/Skin:  No LE edema. Neuro: Alert and oriented X 3. Moves all extremities spontaneously. Gait is normal. CNII-XII grossly in tact. Psych:  Responds to questions appropriately with a normal affect.     ASSESSMENT AND PLAN:  71 y.o. year old male with   1. Essential  hypertension 10/23/2016: Blood pressure is now controlled. Continue current medications.            CMET was checked 07/11/16 and was stable 2. Hyperlipidemia 10/23/2016:: Cardiology checked lipid panel and liver 07/11/16. He is continuing current dose of pravastatin.  3. Prostate cancer screening Elevated PSA  10/23/2016: PSA was checked 04/03/16 and came back elevated at 5.3. At that time we placed order for referral to urology.         He has had f/u with Urology and has further f/u scheduled.   4. BPH associated with nocturia 10/23/2016::  he states that symptoms are improved with Flomax. Continue Flomax. Follow-up with urology appointment see #3 above. - tamsulosin (FLOMAX) 0.4 MG CAPS capsule; Take 1 capsule (0.4 mg total) by mouth daily.  Dispense: 30 capsule; Refill: 3  Atrial fibrillation,  unspecified type ([place]) 10/23/2016: Managed by Cardiology   H/O Tobacco abuse 10/23/2016:Quit smoking 2016  Hyperglycemia 10/23/2016: Recheck A1C to monitor - Hemoglobin A1C, fingerstick 10/23/2016: He is on ACE inh, Statin, -- on Coumadin so no ASA 10/23/2016: He does have routine eye exams--last was 07/2016  Acute maxillary sinusitis, recurrence not specified 10/23/2016: At this time I will treat with antibiotic and another round of prednisone. Note he has allergy to penicillin.  - levofloxacin (LEVAQUIN) 750 MG tablet; Take 1 tablet (750 mg total) by mouth daily.  Dispense: 7 tablet; Refill: 0 - predniSONE (DELTASONE) 20 MG tablet; Take 3 daily for 2 days, then 2 daily for 2 days, then 1 daily for 2 days.  Dispense: 12 tablet; Refill: 0  Acute bronchitis with COPD ([place]) 10/23/2016: At this time I will treat with antibiotic and another round of prednisone. Note he has allergy to penicillin. - levofloxacin (LEVAQUIN) 750 MG tablet; Take 1 tablet (750 mg total) by mouth daily.  Dispense: 7 tablet; Refill: 0 - predniSONE (DELTASONE) 20 MG tablet; Take 3 daily for 2 days, then 2 daily for 2 days, then  1 daily for 2 days.  Dispense: 12 tablet; Refill: 0   Routine follow-up visit 3 months. Sooner if needed.  Signed, [address] [place], [place], [facility] 10/23/2016 8:11 AM

## 2016-10-24 ENCOUNTER — Other Ambulatory Visit: Payer: Self-pay | Admitting: Physician Assistant

## 2016-10-24 ENCOUNTER — Other Ambulatory Visit: Payer: Self-pay

## 2016-10-24 MED ORDER — METFORMIN HCL 500 MG PO TABS: 500.0000 mg | ORAL_TABLET | Freq: Two times a day (BID) | ORAL | 5 refills | Status: DC

## 2016-10-30 ENCOUNTER — Other Ambulatory Visit: Payer: Self-pay | Admitting: Cardiology

## 2016-11-07 DIAGNOSIS — R972 Elevated prostate specific antigen [PSA]: Secondary | ICD-10-CM | POA: Diagnosis not present

## 2016-11-13 ENCOUNTER — Ambulatory Visit (INDEPENDENT_AMBULATORY_CARE_PROVIDER_SITE_OTHER): Payer: Medicare HMO | Admitting: *Deleted

## 2016-11-13 DIAGNOSIS — Z7901 Long term (current) use of anticoagulants: Secondary | ICD-10-CM | POA: Diagnosis not present

## 2016-11-13 DIAGNOSIS — I4891 Unspecified atrial fibrillation: Secondary | ICD-10-CM

## 2016-11-13 DIAGNOSIS — Z5181 Encounter for therapeutic drug level monitoring: Secondary | ICD-10-CM

## 2016-11-13 LAB — POCT INR: INR: 3.1

## 2016-11-21 ENCOUNTER — Encounter (HOSPITAL_BASED_OUTPATIENT_CLINIC_OR_DEPARTMENT_OTHER): Payer: Medicare HMO | Attending: Surgery

## 2016-11-21 ENCOUNTER — Telehealth: Payer: Self-pay

## 2016-11-21 DIAGNOSIS — Z7901 Long term (current) use of anticoagulants: Secondary | ICD-10-CM | POA: Diagnosis not present

## 2016-11-21 DIAGNOSIS — Z86718 Personal history of other venous thrombosis and embolism: Secondary | ICD-10-CM | POA: Insufficient documentation

## 2016-11-21 DIAGNOSIS — I890 Lymphedema, not elsewhere classified: Secondary | ICD-10-CM | POA: Diagnosis not present

## 2016-11-21 DIAGNOSIS — Z79899 Other long term (current) drug therapy: Secondary | ICD-10-CM | POA: Diagnosis not present

## 2016-11-21 DIAGNOSIS — Z7951 Long term (current) use of inhaled steroids: Secondary | ICD-10-CM | POA: Insufficient documentation

## 2016-11-21 DIAGNOSIS — I10 Essential (primary) hypertension: Secondary | ICD-10-CM | POA: Diagnosis not present

## 2016-11-21 DIAGNOSIS — E11622 Type 2 diabetes mellitus with other skin ulcer: Secondary | ICD-10-CM | POA: Diagnosis not present

## 2016-11-21 DIAGNOSIS — I481 Persistent atrial fibrillation: Secondary | ICD-10-CM | POA: Diagnosis not present

## 2016-11-21 DIAGNOSIS — L97822 Non-pressure chronic ulcer of other part of left lower leg with fat layer exposed: Secondary | ICD-10-CM | POA: Diagnosis not present

## 2016-11-21 DIAGNOSIS — I83022 Varicose veins of left lower extremity with ulcer of calf: Secondary | ICD-10-CM | POA: Insufficient documentation

## 2016-11-21 DIAGNOSIS — Z87891 Personal history of nicotine dependence: Secondary | ICD-10-CM | POA: Diagnosis not present

## 2016-11-21 MED ORDER — ALCOHOL PADS 70 % PADS: MEDICATED_PAD | 11 refills | Status: AC

## 2016-11-21 MED ORDER — BLOOD GLUCOSE MONITOR KIT: PACK | 0 refills | Status: AC

## 2016-11-21 NOTE — Telephone Encounter (Signed)
Patient called and stated he went to the burn center today and they instucted him to contact our office to get a prescription called in so that he can check his blood sugar at home daily.  Patient also asked for diabetic literature to look over regarding eating. I have sent this before as requested patient states they have a new mail carrier and that he did not get the previous information.  Explained to patient I will send in the diabetic supply prescription as well as mail out his diabetic literature to post office box as well as his home address again and I verified his home address on file which is correct.  Patient understands.

## 2016-11-23 ENCOUNTER — Other Ambulatory Visit: Payer: Self-pay | Admitting: Physician Assistant

## 2016-11-27 ENCOUNTER — Ambulatory Visit (INDEPENDENT_AMBULATORY_CARE_PROVIDER_SITE_OTHER): Payer: Medicare HMO

## 2016-11-27 DIAGNOSIS — I4891 Unspecified atrial fibrillation: Secondary | ICD-10-CM

## 2016-11-27 DIAGNOSIS — Z5181 Encounter for therapeutic drug level monitoring: Secondary | ICD-10-CM

## 2016-11-27 DIAGNOSIS — Z7901 Long term (current) use of anticoagulants: Secondary | ICD-10-CM | POA: Diagnosis not present

## 2016-11-27 LAB — POCT INR: INR: 2.1

## 2016-11-28 DIAGNOSIS — E11622 Type 2 diabetes mellitus with other skin ulcer: Secondary | ICD-10-CM | POA: Diagnosis not present

## 2016-11-28 DIAGNOSIS — I481 Persistent atrial fibrillation: Secondary | ICD-10-CM | POA: Diagnosis not present

## 2016-11-28 DIAGNOSIS — I872 Venous insufficiency (chronic) (peripheral): Secondary | ICD-10-CM | POA: Diagnosis not present

## 2016-11-28 DIAGNOSIS — Z79899 Other long term (current) drug therapy: Secondary | ICD-10-CM | POA: Diagnosis not present

## 2016-11-28 DIAGNOSIS — Z86718 Personal history of other venous thrombosis and embolism: Secondary | ICD-10-CM | POA: Diagnosis not present

## 2016-11-28 DIAGNOSIS — E119 Type 2 diabetes mellitus without complications: Secondary | ICD-10-CM | POA: Diagnosis not present

## 2016-11-28 DIAGNOSIS — L97822 Non-pressure chronic ulcer of other part of left lower leg with fat layer exposed: Secondary | ICD-10-CM | POA: Diagnosis not present

## 2016-11-28 DIAGNOSIS — I10 Essential (primary) hypertension: Secondary | ICD-10-CM | POA: Diagnosis not present

## 2016-11-28 DIAGNOSIS — I83022 Varicose veins of left lower extremity with ulcer of calf: Secondary | ICD-10-CM | POA: Diagnosis not present

## 2016-11-28 DIAGNOSIS — Z87891 Personal history of nicotine dependence: Secondary | ICD-10-CM | POA: Diagnosis not present

## 2016-11-28 DIAGNOSIS — I890 Lymphedema, not elsewhere classified: Secondary | ICD-10-CM | POA: Diagnosis not present

## 2016-12-05 ENCOUNTER — Encounter (HOSPITAL_BASED_OUTPATIENT_CLINIC_OR_DEPARTMENT_OTHER): Payer: Medicare HMO | Attending: Surgery

## 2016-12-05 DIAGNOSIS — L97822 Non-pressure chronic ulcer of other part of left lower leg with fat layer exposed: Secondary | ICD-10-CM | POA: Diagnosis not present

## 2016-12-05 DIAGNOSIS — Z87891 Personal history of nicotine dependence: Secondary | ICD-10-CM | POA: Insufficient documentation

## 2016-12-05 DIAGNOSIS — I872 Venous insufficiency (chronic) (peripheral): Secondary | ICD-10-CM | POA: Diagnosis not present

## 2016-12-05 DIAGNOSIS — Z86718 Personal history of other venous thrombosis and embolism: Secondary | ICD-10-CM | POA: Diagnosis not present

## 2016-12-05 DIAGNOSIS — L97229 Non-pressure chronic ulcer of left calf with unspecified severity: Secondary | ICD-10-CM | POA: Insufficient documentation

## 2016-12-05 DIAGNOSIS — E11622 Type 2 diabetes mellitus with other skin ulcer: Secondary | ICD-10-CM | POA: Insufficient documentation

## 2016-12-05 DIAGNOSIS — Z7901 Long term (current) use of anticoagulants: Secondary | ICD-10-CM | POA: Insufficient documentation

## 2016-12-05 DIAGNOSIS — I83028 Varicose veins of left lower extremity with ulcer other part of lower leg: Secondary | ICD-10-CM | POA: Insufficient documentation

## 2016-12-05 DIAGNOSIS — I10 Essential (primary) hypertension: Secondary | ICD-10-CM | POA: Insufficient documentation

## 2016-12-05 DIAGNOSIS — I481 Persistent atrial fibrillation: Secondary | ICD-10-CM | POA: Insufficient documentation

## 2016-12-05 DIAGNOSIS — I890 Lymphedema, not elsewhere classified: Secondary | ICD-10-CM | POA: Diagnosis not present

## 2016-12-05 DIAGNOSIS — L97222 Non-pressure chronic ulcer of left calf with fat layer exposed: Secondary | ICD-10-CM | POA: Diagnosis not present

## 2016-12-12 DIAGNOSIS — I890 Lymphedema, not elsewhere classified: Secondary | ICD-10-CM | POA: Diagnosis not present

## 2016-12-12 DIAGNOSIS — L97222 Non-pressure chronic ulcer of left calf with fat layer exposed: Secondary | ICD-10-CM | POA: Diagnosis not present

## 2016-12-12 DIAGNOSIS — I83028 Varicose veins of left lower extremity with ulcer other part of lower leg: Secondary | ICD-10-CM | POA: Diagnosis not present

## 2016-12-12 DIAGNOSIS — I10 Essential (primary) hypertension: Secondary | ICD-10-CM | POA: Diagnosis not present

## 2016-12-12 DIAGNOSIS — L97229 Non-pressure chronic ulcer of left calf with unspecified severity: Secondary | ICD-10-CM | POA: Diagnosis not present

## 2016-12-12 DIAGNOSIS — E11622 Type 2 diabetes mellitus with other skin ulcer: Secondary | ICD-10-CM | POA: Diagnosis not present

## 2016-12-12 DIAGNOSIS — I481 Persistent atrial fibrillation: Secondary | ICD-10-CM | POA: Diagnosis not present

## 2016-12-12 DIAGNOSIS — I872 Venous insufficiency (chronic) (peripheral): Secondary | ICD-10-CM | POA: Diagnosis not present

## 2016-12-12 DIAGNOSIS — Z7901 Long term (current) use of anticoagulants: Secondary | ICD-10-CM | POA: Diagnosis not present

## 2016-12-12 DIAGNOSIS — L97822 Non-pressure chronic ulcer of other part of left lower leg with fat layer exposed: Secondary | ICD-10-CM | POA: Diagnosis not present

## 2016-12-19 DIAGNOSIS — L97222 Non-pressure chronic ulcer of left calf with fat layer exposed: Secondary | ICD-10-CM | POA: Diagnosis not present

## 2016-12-19 DIAGNOSIS — L97229 Non-pressure chronic ulcer of left calf with unspecified severity: Secondary | ICD-10-CM | POA: Diagnosis not present

## 2016-12-19 DIAGNOSIS — Z7901 Long term (current) use of anticoagulants: Secondary | ICD-10-CM | POA: Diagnosis not present

## 2016-12-19 DIAGNOSIS — L97822 Non-pressure chronic ulcer of other part of left lower leg with fat layer exposed: Secondary | ICD-10-CM | POA: Diagnosis not present

## 2016-12-19 DIAGNOSIS — I10 Essential (primary) hypertension: Secondary | ICD-10-CM | POA: Diagnosis not present

## 2016-12-19 DIAGNOSIS — I872 Venous insufficiency (chronic) (peripheral): Secondary | ICD-10-CM | POA: Diagnosis not present

## 2016-12-19 DIAGNOSIS — E11622 Type 2 diabetes mellitus with other skin ulcer: Secondary | ICD-10-CM | POA: Diagnosis not present

## 2016-12-19 DIAGNOSIS — I890 Lymphedema, not elsewhere classified: Secondary | ICD-10-CM | POA: Diagnosis not present

## 2016-12-19 DIAGNOSIS — I83028 Varicose veins of left lower extremity with ulcer other part of lower leg: Secondary | ICD-10-CM | POA: Diagnosis not present

## 2016-12-19 DIAGNOSIS — I481 Persistent atrial fibrillation: Secondary | ICD-10-CM | POA: Diagnosis not present

## 2016-12-25 ENCOUNTER — Ambulatory Visit (INDEPENDENT_AMBULATORY_CARE_PROVIDER_SITE_OTHER): Payer: Medicare HMO | Admitting: *Deleted

## 2016-12-25 DIAGNOSIS — Z5181 Encounter for therapeutic drug level monitoring: Secondary | ICD-10-CM | POA: Diagnosis not present

## 2016-12-25 DIAGNOSIS — I4891 Unspecified atrial fibrillation: Secondary | ICD-10-CM | POA: Diagnosis not present

## 2016-12-25 DIAGNOSIS — Z7901 Long term (current) use of anticoagulants: Secondary | ICD-10-CM | POA: Diagnosis not present

## 2016-12-25 LAB — POCT INR: INR: 1.8

## 2016-12-26 DIAGNOSIS — E11622 Type 2 diabetes mellitus with other skin ulcer: Secondary | ICD-10-CM | POA: Diagnosis not present

## 2016-12-26 DIAGNOSIS — I10 Essential (primary) hypertension: Secondary | ICD-10-CM | POA: Diagnosis not present

## 2016-12-26 DIAGNOSIS — L97229 Non-pressure chronic ulcer of left calf with unspecified severity: Secondary | ICD-10-CM | POA: Diagnosis not present

## 2016-12-26 DIAGNOSIS — I83028 Varicose veins of left lower extremity with ulcer other part of lower leg: Secondary | ICD-10-CM | POA: Diagnosis not present

## 2016-12-26 DIAGNOSIS — Z7901 Long term (current) use of anticoagulants: Secondary | ICD-10-CM | POA: Diagnosis not present

## 2016-12-26 DIAGNOSIS — L97222 Non-pressure chronic ulcer of left calf with fat layer exposed: Secondary | ICD-10-CM | POA: Diagnosis not present

## 2016-12-26 DIAGNOSIS — L97822 Non-pressure chronic ulcer of other part of left lower leg with fat layer exposed: Secondary | ICD-10-CM | POA: Diagnosis not present

## 2016-12-26 DIAGNOSIS — I890 Lymphedema, not elsewhere classified: Secondary | ICD-10-CM | POA: Diagnosis not present

## 2016-12-26 DIAGNOSIS — I872 Venous insufficiency (chronic) (peripheral): Secondary | ICD-10-CM | POA: Diagnosis not present

## 2016-12-26 DIAGNOSIS — I481 Persistent atrial fibrillation: Secondary | ICD-10-CM | POA: Diagnosis not present

## 2017-01-03 DIAGNOSIS — I83028 Varicose veins of left lower extremity with ulcer other part of lower leg: Secondary | ICD-10-CM | POA: Diagnosis not present

## 2017-01-03 DIAGNOSIS — L97229 Non-pressure chronic ulcer of left calf with unspecified severity: Secondary | ICD-10-CM | POA: Diagnosis not present

## 2017-01-03 DIAGNOSIS — Z7901 Long term (current) use of anticoagulants: Secondary | ICD-10-CM | POA: Diagnosis not present

## 2017-01-03 DIAGNOSIS — I872 Venous insufficiency (chronic) (peripheral): Secondary | ICD-10-CM | POA: Diagnosis not present

## 2017-01-03 DIAGNOSIS — I890 Lymphedema, not elsewhere classified: Secondary | ICD-10-CM | POA: Diagnosis not present

## 2017-01-03 DIAGNOSIS — I481 Persistent atrial fibrillation: Secondary | ICD-10-CM | POA: Diagnosis not present

## 2017-01-03 DIAGNOSIS — L97222 Non-pressure chronic ulcer of left calf with fat layer exposed: Secondary | ICD-10-CM | POA: Diagnosis not present

## 2017-01-03 DIAGNOSIS — I10 Essential (primary) hypertension: Secondary | ICD-10-CM | POA: Diagnosis not present

## 2017-01-03 DIAGNOSIS — L97822 Non-pressure chronic ulcer of other part of left lower leg with fat layer exposed: Secondary | ICD-10-CM | POA: Diagnosis not present

## 2017-01-03 DIAGNOSIS — E11622 Type 2 diabetes mellitus with other skin ulcer: Secondary | ICD-10-CM | POA: Diagnosis not present

## 2017-01-03 DIAGNOSIS — E119 Type 2 diabetes mellitus without complications: Secondary | ICD-10-CM | POA: Diagnosis not present

## 2017-01-10 ENCOUNTER — Encounter (HOSPITAL_BASED_OUTPATIENT_CLINIC_OR_DEPARTMENT_OTHER): Payer: Medicare HMO | Attending: Surgery

## 2017-01-10 DIAGNOSIS — I83022 Varicose veins of left lower extremity with ulcer of calf: Secondary | ICD-10-CM | POA: Diagnosis not present

## 2017-01-10 DIAGNOSIS — I890 Lymphedema, not elsewhere classified: Secondary | ICD-10-CM | POA: Insufficient documentation

## 2017-01-10 DIAGNOSIS — L97222 Non-pressure chronic ulcer of left calf with fat layer exposed: Secondary | ICD-10-CM | POA: Diagnosis not present

## 2017-01-10 DIAGNOSIS — I10 Essential (primary) hypertension: Secondary | ICD-10-CM | POA: Insufficient documentation

## 2017-01-10 DIAGNOSIS — L97829 Non-pressure chronic ulcer of other part of left lower leg with unspecified severity: Secondary | ICD-10-CM | POA: Insufficient documentation

## 2017-01-10 DIAGNOSIS — E11622 Type 2 diabetes mellitus with other skin ulcer: Secondary | ICD-10-CM | POA: Insufficient documentation

## 2017-01-10 DIAGNOSIS — I481 Persistent atrial fibrillation: Secondary | ICD-10-CM | POA: Insufficient documentation

## 2017-01-10 DIAGNOSIS — Z86718 Personal history of other venous thrombosis and embolism: Secondary | ICD-10-CM | POA: Insufficient documentation

## 2017-01-10 DIAGNOSIS — I4891 Unspecified atrial fibrillation: Secondary | ICD-10-CM | POA: Diagnosis not present

## 2017-01-10 DIAGNOSIS — Z7901 Long term (current) use of anticoagulants: Secondary | ICD-10-CM | POA: Insufficient documentation

## 2017-01-10 DIAGNOSIS — I872 Venous insufficiency (chronic) (peripheral): Secondary | ICD-10-CM | POA: Diagnosis not present

## 2017-01-10 DIAGNOSIS — L97822 Non-pressure chronic ulcer of other part of left lower leg with fat layer exposed: Secondary | ICD-10-CM | POA: Diagnosis not present

## 2017-01-16 ENCOUNTER — Other Ambulatory Visit: Payer: Self-pay | Admitting: Physician Assistant

## 2017-01-16 DIAGNOSIS — I10 Essential (primary) hypertension: Secondary | ICD-10-CM

## 2017-01-17 DIAGNOSIS — Z86718 Personal history of other venous thrombosis and embolism: Secondary | ICD-10-CM | POA: Diagnosis not present

## 2017-01-17 DIAGNOSIS — L97822 Non-pressure chronic ulcer of other part of left lower leg with fat layer exposed: Secondary | ICD-10-CM | POA: Diagnosis not present

## 2017-01-17 DIAGNOSIS — Z7901 Long term (current) use of anticoagulants: Secondary | ICD-10-CM | POA: Diagnosis not present

## 2017-01-17 DIAGNOSIS — I890 Lymphedema, not elsewhere classified: Secondary | ICD-10-CM | POA: Diagnosis not present

## 2017-01-17 DIAGNOSIS — I481 Persistent atrial fibrillation: Secondary | ICD-10-CM | POA: Diagnosis not present

## 2017-01-17 DIAGNOSIS — I10 Essential (primary) hypertension: Secondary | ICD-10-CM | POA: Diagnosis not present

## 2017-01-17 DIAGNOSIS — I83022 Varicose veins of left lower extremity with ulcer of calf: Secondary | ICD-10-CM | POA: Diagnosis not present

## 2017-01-17 DIAGNOSIS — I872 Venous insufficiency (chronic) (peripheral): Secondary | ICD-10-CM | POA: Diagnosis not present

## 2017-01-17 DIAGNOSIS — E11622 Type 2 diabetes mellitus with other skin ulcer: Secondary | ICD-10-CM | POA: Diagnosis not present

## 2017-01-17 DIAGNOSIS — I4891 Unspecified atrial fibrillation: Secondary | ICD-10-CM | POA: Diagnosis not present

## 2017-01-17 DIAGNOSIS — L97829 Non-pressure chronic ulcer of other part of left lower leg with unspecified severity: Secondary | ICD-10-CM | POA: Diagnosis not present

## 2017-01-17 NOTE — Telephone Encounter (Signed)
Refill appropriate 

## 2017-01-22 ENCOUNTER — Ambulatory Visit (INDEPENDENT_AMBULATORY_CARE_PROVIDER_SITE_OTHER): Payer: Medicare HMO | Admitting: Physician Assistant

## 2017-01-22 ENCOUNTER — Encounter: Payer: Self-pay | Admitting: Physician Assistant

## 2017-01-22 VITALS — BP 130/84 | HR 77 | Temp 97.4°F | Resp 16 | Ht 68.0 in | Wt 253.8 lb

## 2017-01-22 DIAGNOSIS — R739 Hyperglycemia, unspecified: Secondary | ICD-10-CM

## 2017-01-22 DIAGNOSIS — E785 Hyperlipidemia, unspecified: Secondary | ICD-10-CM | POA: Diagnosis not present

## 2017-01-22 DIAGNOSIS — R5383 Other fatigue: Secondary | ICD-10-CM | POA: Diagnosis not present

## 2017-01-22 DIAGNOSIS — Z1159 Encounter for screening for other viral diseases: Secondary | ICD-10-CM

## 2017-01-22 DIAGNOSIS — I10 Essential (primary) hypertension: Secondary | ICD-10-CM

## 2017-01-22 DIAGNOSIS — R531 Weakness: Secondary | ICD-10-CM

## 2017-01-22 DIAGNOSIS — R972 Elevated prostate specific antigen [PSA]: Secondary | ICD-10-CM | POA: Diagnosis not present

## 2017-01-22 DIAGNOSIS — I4891 Unspecified atrial fibrillation: Secondary | ICD-10-CM | POA: Diagnosis not present

## 2017-01-22 DIAGNOSIS — E119 Type 2 diabetes mellitus without complications: Secondary | ICD-10-CM | POA: Insufficient documentation

## 2017-01-22 LAB — TSH: TSH: 0.8 m[IU]/L (ref 0.40–4.50)

## 2017-01-22 LAB — COMPLETE METABOLIC PANEL WITH GFR
ALT: 18 U/L (ref 9–46)
AST: 15 U/L (ref 10–35)
Albumin: 3.8 g/dL (ref 3.6–5.1)
Alkaline Phosphatase: 52 U/L (ref 40–115)
BILIRUBIN TOTAL: 0.5 mg/dL (ref 0.2–1.2)
BUN: 16 mg/dL (ref 7–25)
CALCIUM: 9.5 mg/dL (ref 8.6–10.3)
CHLORIDE: 102 mmol/L (ref 98–110)
CO2: 26 mmol/L (ref 20–31)
CREATININE: 1.11 mg/dL (ref 0.70–1.18)
GFR, EST AFRICAN AMERICAN: 77 mL/min (ref >=60)
GFR, Est Non African American: 67 mL/min (ref >=60)
Glucose, Bld: 93 mg/dL (ref 70–99)
Potassium: 4.3 mmol/L (ref 3.5–5.3)
Sodium: 141 mmol/L (ref 135–146)
TOTAL PROTEIN: 6.7 g/dL (ref 6.1–8.1)

## 2017-01-22 LAB — CBC WITH DIFFERENTIAL/PLATELET
BASOS PCT: 1 %
Basophils Absolute: 83 cells/uL (ref 0–200)
EOS ABS: 166 {cells}/uL (ref 15–500)
Eosinophils Relative: 2 %
HEMATOCRIT: 45.9 % (ref 38.5–50.0)
HEMOGLOBIN: 14.8 g/dL (ref 13.0–17.0)
LYMPHS ABS: 1909 {cells}/uL (ref 850–3900)
Lymphocytes Relative: 23 %
MCH: 30.4 pg (ref 27.0–33.0)
MCHC: 32.2 g/dL (ref 32.0–36.0)
MCV: 94.3 fL (ref 80.0–100.0)
MONO ABS: 664 {cells}/uL (ref 200–950)
MPV: 9.1 fL (ref 7.5–12.5)
Monocytes Relative: 8 %
Neutro Abs: 5478 cells/uL (ref 1500–7800)
Neutrophils Relative %: 66 %
Platelets: 270 10*3/uL (ref 140–400)
RBC: 4.87 MIL/uL (ref 4.20–5.80)
RDW: 14.2 % (ref 11.0–15.0)
WBC: 8.3 10*3/uL (ref 3.8–10.8)

## 2017-01-22 NOTE — Progress Notes (Signed)
Patient ID: William Stanley MRN: [id number], DOB: 1945/11/29, 71 y.o. Date of Encounter: 01/22/2017, 9:17 AM    Chief Complaint:  Chief Complaint  Patient presents with  . 3 month routine f/u  . Fatigue     HPI: 71 y.o. year old male presents with above.    03/23/2016: He recently called here reporting that when he had gone to the wound clinic recently his blood pressure was high and he was concerned. Therefore, he was scheduled visit for today and we did get a fax from the wound center with some recent blood pressure readings there. 03/15/2016 at 8:00 AM blood pressure reading at the wound center was 162/98. 03/22/2016 at 9:15 AM at the wound Center blood pressure reading was 160/88.  He has had no other blood pressure checks elsewhere to know how his blood pressure has been running otherwise----since LOV with me--12/09/2015.  I did review his anticoagulation visits but they do not check blood pressures at those visits.  He is taking current blood pressure medication Norvasc 5 mg daily as directed.  He is going to the wound center secondary to wound on lower leg from venous stasis ulcer.  Today he states that he also has been having some nasal congestion last few days but not as bad as it usually is in the past. Blowing nothing out of his nose. No chest congestion or cough. No sore throat or earache fevers or chills.  No other complaints or concerns.  AT THAT OV: Added benazepril 9m QD.   04/03/2016: States that he is taking the benazepril 10 mg daily in addition to his other medicines.  He checked his blood pressure and pulse on one occasion and brought in his readings. Blood Pressure was 127/71.  Pulse was 72. He states that since he knew he had to do blood work today, he did come fasting so that we can check his cholesterol as well.  Also wants to check PSA. Says he wakes up 3 or 4 times a night to urinate. Wants a medication to help with that. No other complaints or  concerns.  07/24/2016: He continues to take benazepril daily with no adverse effects. Reviewed that when PSA was checked 04/03/16 came back elevated at 5.3 several recommended referral to urology. He has appointment scheduled to see urology January 4. At visit 04/03/16 he was having nocturia as we checked the PSA and also added Flomax. Days he is taking the Flomax and it has helped those symptoms. He does plan to follow-up with the urology appointment and is aware that he needs to do so. Notes that he just recently had labs drawn by cardiology 07/11/16 and I have reviewed these today. He had lipid panel CMET CBC at that time. Last A1c was 04/14/2016---6.5 so will need to repeat this today.  10/23/2016:  He is here for routine ffice visit to follow-up Hyperglycemia/ A1c. He does have routine eye exam. Says he recently had eye surgery. Last eye exam December 2017. Also he says that he is having recurrent pressure in his face and congestion. I reviewed his last office note from 10/16/16. At that time he said that because of recent weather changes and trees blooming he was noticing some wheezing and also some crackling in his ear and some sinus pressure. At that visit -- restarted his Zyrtec and gave him prednisone. He says that while he was on the prednisone he was better but then the symptoms have returned. Having pressure in his  face and the crackling sound in his ears again. States that he has had follow-up with urology. Says that so far "everything checked out okay but he is supposed to go back for another lab and then a follow-up appointment with the doctor here in the next couple months. He continues to see Cardiology routinely. Reviewed that they checked lipid panel, bmet, LFTs on 07/21/16 and they check INR routinely.   01/22/2017: Today he reports that about 2 weeks ago he had some diarrhea but then that has resolved. Says that also for the past 2 weeks he has been feeling weak, lousy, drained. Says  he just gets that feeling like he needs to go lay down at times. Asked me if this may be secondary to "that pill you added for my sugar ". Reviewed that 10/23/16 added metformin 500 mg twice a day. However then reviewed with him again when the symptoms started and he definitely says that the symptoms that just occurred over the past 2 weeks. He states that he did start the metformin right after we informed him of that back in March. Says that he did not develop the symptoms at that time but only just recently developed them in the last couple of weeks.  He states that he checks his blood sugar either fasting morning or in the afternoon. Says that all blood sugars have been in the range between 82 and 129. Lowest reading 82 -- nothing lower.  He states that he has changed his diet a lot. Has stopped all sodas and has stopped all bread. Says he is eating mostly chicken. His weight is down 15 pounds from his last office visit 3 months ago!!!!    10/23/16 was 268 pounds.     01/22/17 he is 253.8 pounds. !!!  I reviewed with him what he told me about his urology follow-up as of OV 10/23/16. He states that since then he did have a test there and is supposed to have another test and follow-up with the doctor next month. Says that if he understood things correctly his last PSA was higher.  He is taking his blood pressure medication as directed.  He is taking his pravastatin as directed. No myalgias or right upper quadrant pain.  No other complaints or concerns to address today.    Home Meds:   Outpatient Medications Prior to Visit  Medication Sig Dispense Refill  . ACCU-CHEK AVIVA PLUS test strip CHECK BLOOD SUGAR THREE TIMES DAILY 250 each 0  . ACCU-CHEK SOFTCLIX LANCETS lancets CHECK BLOOD SUGAR THREE TIMES DAILY 200 each 0  . albuterol (VENTOLIN HFA) 108 (90 Base) MCG/ACT inhaler INHALE 1-2 PUFFS INTO LUNGS Every 4 hours AS NEEDED 18 g 1  . Alcohol Swabs (ALCOHOL PADS) 70 % PADS Use to check blood  sugar daily 100 each 11  . amLODipine (NORVASC) 5 MG tablet Take 1 tablet (5 mg total) by mouth daily. 90 tablet 3  . benazepril (LOTENSIN) 10 MG tablet TAKE 1 TABLET BY MOUTH EVERY DAY 90 tablet 0  . blood glucose meter kit and supplies KIT Dispense based on patient and insurance preference. Check blood sugar three times daily  (FOR ICD-10E11.5). 1 each 0  . cetirizine (ZYRTEC) 10 MG tablet Take 1 tablet (10 mg total) by mouth daily. 30 tablet 11  . fluticasone (FLONASE) 50 MCG/ACT nasal spray Place 2 sprays into both nostrils daily. 16 g 6  . metFORMIN (GLUCOPHAGE) 500 MG tablet Take 1 tablet (500 mg total) by mouth  2 (two) times daily with a meal. 60 tablet 5  . pravastatin (PRAVACHOL) 20 MG tablet Take 1 tablet (20 mg total) by mouth daily. 90 tablet 3  . tamsulosin (FLOMAX) 0.4 MG CAPS capsule Take 1 capsule (0.4 mg total) by mouth daily. 90 capsule 1  . VENTOLIN HFA 108 (90 Base) MCG/ACT inhaler INHALE 1-2 PUFFS INTO LUNGS AS NEEDED 18 g 0  . warfarin (COUMADIN) 5 MG tablet Take as directed by [facility] 135 tablet 1  . levofloxacin (LEVAQUIN) 750 MG tablet Take 1 tablet (750 mg total) by mouth daily. 7 tablet 0  . predniSONE (DELTASONE) 20 MG tablet Take 2 daily for 3 days then take 1 daily for 2 days 8 tablet 0  . predniSONE (DELTASONE) 20 MG tablet Take 3 daily for 2 days, then 2 daily for 2 days, then 1 daily for 2 days. 12 tablet 0   No facility-administered medications prior to visit.     Allergies:  Allergies  Allergen Reactions  . Penicillins       Review of Systems: See HPI for pertinent ROS. All other ROS negative.    Physical Exam: Blood pressure 130/84, pulse 77, temperature 97.4 F (36.3 C), temperature source Oral, resp. rate 16, height _0  (1.727 m), weight 253 lb 12.8 oz (115.1 kg), SpO2 98 %., Body mass index is 38.59 kg/m. General: Obese WM. Appears in no acute distress. Neck: Supple. No thyromegaly. No lymphadenopathy. Lungs: Clear Heart: Irregular  rhythm. No murmurs, rubs Msk:  Strength and tone normal for age. Extremities/Skin:  No LE edema. Neuro: Alert and oriented X 3. Moves all extremities spontaneously. Gait is normal. CNII-XII grossly in tact. Psych:  Responds to questions appropriately with a normal affect.     ASSESSMENT AND PLAN:  71 y.o. year old male with   1. Fatigue, unspecified type - CBC with Differential/Platelet - COMPLETE METABOLIC PANEL WITH GFR - TSH  2. Weakness - CBC with Differential/Platelet - COMPLETE METABOLIC PANEL WITH GFR - TSH    Need for hepatitis C screening test 01/22/2017::He also states that he has seen advertisements that "baby Boomers supposed to have some type of test done and would like to have this checked - Hepatitis C antibody  Type 2 diabetes mellitus without complication, without long-term current use of insulin ([place]) 10/23/2016: Recheck A1C to monitor - Hemoglobin A1C, fingerstick 10/23/2016: He is on ACE inh, Statin, -- on Coumadin so no ASA 10/23/2016: He does have routine eye exams--last was 07/2016 01/22/2017:: At AV 10/23/16 his A1c had increased. At that time started metformin 500 mg twice a day. Continue this. Check A1c.   Essential hypertension 01/22/2017: Blood pressure is now controlled. Continue current medications.            Hyperlipidemia 01/22/2017:: Cardiology checked lipid panel and liver 07/11/16. He is continuing current dose of pravastatin.  01/22/2017:: He is not fasting. Will recheck LFTs but will wait to recheck FLP in future.  Prostate cancer screening Elevated PSA  10/23/2016: PSA was checked 04/03/16 and came back elevated at 5.3. At that time we placed order for referral to urology.         He has had f/u with Urology and has further f/u scheduled.  01/22/2017:: He is continuing follow-up with urology.  BPH associated with nocturia 10/23/2016::  he states that symptoms are improved with Flomax. Continue Flomax. Follow-up with urology appointment see #3  above. - tamsulosin (FLOMAX) 0.4 MG CAPS capsule; Take 1 capsule (0.4 mg  total) by mouth daily.  Dispense: 30 capsule; Refill: 3 01/22/2017:: He is continuing follow-up with urology.  Atrial fibrillation, unspecified type ([place]) 01/22/2017: Managed by Cardiology   H/O Tobacco abuse 01/22/2017:Quit smoking 2016     Routine follow-up visit 3 months. Sooner if needed.  Signed, [address] [place], [place], [facility] 01/22/2017 9:17 AM

## 2017-01-23 ENCOUNTER — Ambulatory Visit (INDEPENDENT_AMBULATORY_CARE_PROVIDER_SITE_OTHER): Payer: Medicare HMO | Admitting: Pharmacist

## 2017-01-23 DIAGNOSIS — Z5181 Encounter for therapeutic drug level monitoring: Secondary | ICD-10-CM

## 2017-01-23 DIAGNOSIS — I4891 Unspecified atrial fibrillation: Secondary | ICD-10-CM | POA: Diagnosis not present

## 2017-01-23 DIAGNOSIS — Z7901 Long term (current) use of anticoagulants: Secondary | ICD-10-CM | POA: Diagnosis not present

## 2017-01-23 LAB — HEMOGLOBIN A1C
HEMOGLOBIN A1C: 6.3 % — AB (ref <5.7)
Mean Plasma Glucose: 134 mg/dL

## 2017-01-23 LAB — POCT INR: INR: 3.1

## 2017-01-23 LAB — HEPATITIS C ANTIBODY: HCV AB: NEGATIVE

## 2017-01-24 ENCOUNTER — Ambulatory Visit: Payer: Medicare HMO | Admitting: Physician Assistant

## 2017-01-24 DIAGNOSIS — I890 Lymphedema, not elsewhere classified: Secondary | ICD-10-CM | POA: Diagnosis not present

## 2017-01-24 DIAGNOSIS — Z86718 Personal history of other venous thrombosis and embolism: Secondary | ICD-10-CM | POA: Diagnosis not present

## 2017-01-24 DIAGNOSIS — L97829 Non-pressure chronic ulcer of other part of left lower leg with unspecified severity: Secondary | ICD-10-CM | POA: Diagnosis not present

## 2017-01-24 DIAGNOSIS — I10 Essential (primary) hypertension: Secondary | ICD-10-CM | POA: Diagnosis not present

## 2017-01-24 DIAGNOSIS — I872 Venous insufficiency (chronic) (peripheral): Secondary | ICD-10-CM | POA: Diagnosis not present

## 2017-01-24 DIAGNOSIS — E11622 Type 2 diabetes mellitus with other skin ulcer: Secondary | ICD-10-CM | POA: Diagnosis not present

## 2017-01-24 DIAGNOSIS — Z7901 Long term (current) use of anticoagulants: Secondary | ICD-10-CM | POA: Diagnosis not present

## 2017-01-24 DIAGNOSIS — I4891 Unspecified atrial fibrillation: Secondary | ICD-10-CM | POA: Diagnosis not present

## 2017-01-24 DIAGNOSIS — I481 Persistent atrial fibrillation: Secondary | ICD-10-CM | POA: Diagnosis not present

## 2017-01-24 DIAGNOSIS — I83022 Varicose veins of left lower extremity with ulcer of calf: Secondary | ICD-10-CM | POA: Diagnosis not present

## 2017-01-31 DIAGNOSIS — Z86718 Personal history of other venous thrombosis and embolism: Secondary | ICD-10-CM | POA: Diagnosis not present

## 2017-01-31 DIAGNOSIS — I10 Essential (primary) hypertension: Secondary | ICD-10-CM | POA: Diagnosis not present

## 2017-01-31 DIAGNOSIS — I4891 Unspecified atrial fibrillation: Secondary | ICD-10-CM | POA: Diagnosis not present

## 2017-01-31 DIAGNOSIS — Z7901 Long term (current) use of anticoagulants: Secondary | ICD-10-CM | POA: Diagnosis not present

## 2017-01-31 DIAGNOSIS — I83022 Varicose veins of left lower extremity with ulcer of calf: Secondary | ICD-10-CM | POA: Diagnosis not present

## 2017-01-31 DIAGNOSIS — I872 Venous insufficiency (chronic) (peripheral): Secondary | ICD-10-CM | POA: Diagnosis not present

## 2017-01-31 DIAGNOSIS — I890 Lymphedema, not elsewhere classified: Secondary | ICD-10-CM | POA: Diagnosis not present

## 2017-01-31 DIAGNOSIS — L97829 Non-pressure chronic ulcer of other part of left lower leg with unspecified severity: Secondary | ICD-10-CM | POA: Diagnosis not present

## 2017-01-31 DIAGNOSIS — I481 Persistent atrial fibrillation: Secondary | ICD-10-CM | POA: Diagnosis not present

## 2017-01-31 DIAGNOSIS — E11622 Type 2 diabetes mellitus with other skin ulcer: Secondary | ICD-10-CM | POA: Diagnosis not present

## 2017-02-06 ENCOUNTER — Encounter (HOSPITAL_BASED_OUTPATIENT_CLINIC_OR_DEPARTMENT_OTHER): Payer: Medicare HMO | Attending: Surgery

## 2017-02-06 DIAGNOSIS — L97229 Non-pressure chronic ulcer of left calf with unspecified severity: Secondary | ICD-10-CM | POA: Diagnosis not present

## 2017-02-06 DIAGNOSIS — I890 Lymphedema, not elsewhere classified: Secondary | ICD-10-CM | POA: Insufficient documentation

## 2017-02-06 DIAGNOSIS — I481 Persistent atrial fibrillation: Secondary | ICD-10-CM | POA: Insufficient documentation

## 2017-02-06 DIAGNOSIS — Z7901 Long term (current) use of anticoagulants: Secondary | ICD-10-CM | POA: Insufficient documentation

## 2017-02-06 DIAGNOSIS — I83022 Varicose veins of left lower extremity with ulcer of calf: Secondary | ICD-10-CM | POA: Insufficient documentation

## 2017-02-06 DIAGNOSIS — Z86718 Personal history of other venous thrombosis and embolism: Secondary | ICD-10-CM | POA: Insufficient documentation

## 2017-02-06 DIAGNOSIS — I10 Essential (primary) hypertension: Secondary | ICD-10-CM | POA: Diagnosis not present

## 2017-02-06 DIAGNOSIS — I872 Venous insufficiency (chronic) (peripheral): Secondary | ICD-10-CM | POA: Diagnosis not present

## 2017-02-06 DIAGNOSIS — L97909 Non-pressure chronic ulcer of unspecified part of unspecified lower leg with unspecified severity: Secondary | ICD-10-CM | POA: Diagnosis not present

## 2017-02-06 DIAGNOSIS — E11622 Type 2 diabetes mellitus with other skin ulcer: Secondary | ICD-10-CM | POA: Insufficient documentation

## 2017-02-06 DIAGNOSIS — Z87891 Personal history of nicotine dependence: Secondary | ICD-10-CM | POA: Diagnosis not present

## 2017-02-06 DIAGNOSIS — L97222 Non-pressure chronic ulcer of left calf with fat layer exposed: Secondary | ICD-10-CM | POA: Diagnosis not present

## 2017-02-12 ENCOUNTER — Other Ambulatory Visit: Payer: Self-pay | Admitting: Cardiology

## 2017-02-13 DIAGNOSIS — R972 Elevated prostate specific antigen [PSA]: Secondary | ICD-10-CM | POA: Diagnosis not present

## 2017-02-20 ENCOUNTER — Ambulatory Visit (INDEPENDENT_AMBULATORY_CARE_PROVIDER_SITE_OTHER): Payer: Medicare HMO | Admitting: Pharmacist Clinician (PhC)/ Clinical Pharmacy Specialist

## 2017-02-20 DIAGNOSIS — E11622 Type 2 diabetes mellitus with other skin ulcer: Secondary | ICD-10-CM | POA: Diagnosis not present

## 2017-02-20 DIAGNOSIS — L97222 Non-pressure chronic ulcer of left calf with fat layer exposed: Secondary | ICD-10-CM | POA: Diagnosis not present

## 2017-02-20 DIAGNOSIS — Z7901 Long term (current) use of anticoagulants: Secondary | ICD-10-CM

## 2017-02-20 DIAGNOSIS — I481 Persistent atrial fibrillation: Secondary | ICD-10-CM | POA: Diagnosis not present

## 2017-02-20 DIAGNOSIS — I890 Lymphedema, not elsewhere classified: Secondary | ICD-10-CM | POA: Diagnosis not present

## 2017-02-20 DIAGNOSIS — N400 Enlarged prostate without lower urinary tract symptoms: Secondary | ICD-10-CM | POA: Diagnosis not present

## 2017-02-20 DIAGNOSIS — I4891 Unspecified atrial fibrillation: Secondary | ICD-10-CM | POA: Diagnosis not present

## 2017-02-20 DIAGNOSIS — Z87891 Personal history of nicotine dependence: Secondary | ICD-10-CM | POA: Diagnosis not present

## 2017-02-20 DIAGNOSIS — I83022 Varicose veins of left lower extremity with ulcer of calf: Secondary | ICD-10-CM | POA: Diagnosis not present

## 2017-02-20 DIAGNOSIS — I10 Essential (primary) hypertension: Secondary | ICD-10-CM | POA: Diagnosis not present

## 2017-02-20 DIAGNOSIS — Z5181 Encounter for therapeutic drug level monitoring: Secondary | ICD-10-CM | POA: Diagnosis not present

## 2017-02-20 DIAGNOSIS — Z86718 Personal history of other venous thrombosis and embolism: Secondary | ICD-10-CM | POA: Diagnosis not present

## 2017-02-20 DIAGNOSIS — I872 Venous insufficiency (chronic) (peripheral): Secondary | ICD-10-CM | POA: Diagnosis not present

## 2017-02-20 DIAGNOSIS — R972 Elevated prostate specific antigen [PSA]: Secondary | ICD-10-CM | POA: Diagnosis not present

## 2017-02-20 DIAGNOSIS — L97229 Non-pressure chronic ulcer of left calf with unspecified severity: Secondary | ICD-10-CM | POA: Diagnosis not present

## 2017-02-20 LAB — POCT INR: INR: 2.7

## 2017-03-06 DIAGNOSIS — E11622 Type 2 diabetes mellitus with other skin ulcer: Secondary | ICD-10-CM | POA: Diagnosis not present

## 2017-03-06 DIAGNOSIS — Z86718 Personal history of other venous thrombosis and embolism: Secondary | ICD-10-CM | POA: Diagnosis not present

## 2017-03-06 DIAGNOSIS — L97229 Non-pressure chronic ulcer of left calf with unspecified severity: Secondary | ICD-10-CM | POA: Diagnosis not present

## 2017-03-06 DIAGNOSIS — L97822 Non-pressure chronic ulcer of other part of left lower leg with fat layer exposed: Secondary | ICD-10-CM | POA: Diagnosis not present

## 2017-03-06 DIAGNOSIS — I481 Persistent atrial fibrillation: Secondary | ICD-10-CM | POA: Diagnosis not present

## 2017-03-06 DIAGNOSIS — I10 Essential (primary) hypertension: Secondary | ICD-10-CM | POA: Diagnosis not present

## 2017-03-06 DIAGNOSIS — I890 Lymphedema, not elsewhere classified: Secondary | ICD-10-CM | POA: Diagnosis not present

## 2017-03-06 DIAGNOSIS — Z87891 Personal history of nicotine dependence: Secondary | ICD-10-CM | POA: Diagnosis not present

## 2017-03-06 DIAGNOSIS — I872 Venous insufficiency (chronic) (peripheral): Secondary | ICD-10-CM | POA: Diagnosis not present

## 2017-03-06 DIAGNOSIS — I83022 Varicose veins of left lower extremity with ulcer of calf: Secondary | ICD-10-CM | POA: Diagnosis not present

## 2017-03-06 DIAGNOSIS — Z7901 Long term (current) use of anticoagulants: Secondary | ICD-10-CM | POA: Diagnosis not present

## 2017-03-13 ENCOUNTER — Encounter (HOSPITAL_BASED_OUTPATIENT_CLINIC_OR_DEPARTMENT_OTHER): Payer: Medicare HMO | Attending: Surgery

## 2017-03-13 DIAGNOSIS — I10 Essential (primary) hypertension: Secondary | ICD-10-CM | POA: Insufficient documentation

## 2017-03-13 DIAGNOSIS — L97829 Non-pressure chronic ulcer of other part of left lower leg with unspecified severity: Secondary | ICD-10-CM | POA: Insufficient documentation

## 2017-03-13 DIAGNOSIS — I872 Venous insufficiency (chronic) (peripheral): Secondary | ICD-10-CM | POA: Diagnosis not present

## 2017-03-13 DIAGNOSIS — E119 Type 2 diabetes mellitus without complications: Secondary | ICD-10-CM | POA: Insufficient documentation

## 2017-03-13 DIAGNOSIS — Z86718 Personal history of other venous thrombosis and embolism: Secondary | ICD-10-CM | POA: Insufficient documentation

## 2017-03-13 DIAGNOSIS — I83028 Varicose veins of left lower extremity with ulcer other part of lower leg: Secondary | ICD-10-CM | POA: Diagnosis not present

## 2017-03-13 DIAGNOSIS — I890 Lymphedema, not elsewhere classified: Secondary | ICD-10-CM | POA: Insufficient documentation

## 2017-03-16 DIAGNOSIS — I890 Lymphedema, not elsewhere classified: Secondary | ICD-10-CM | POA: Diagnosis not present

## 2017-03-20 DIAGNOSIS — I872 Venous insufficiency (chronic) (peripheral): Secondary | ICD-10-CM | POA: Diagnosis not present

## 2017-03-20 DIAGNOSIS — L97829 Non-pressure chronic ulcer of other part of left lower leg with unspecified severity: Secondary | ICD-10-CM | POA: Diagnosis not present

## 2017-03-22 ENCOUNTER — Other Ambulatory Visit: Payer: Self-pay | Admitting: Physician Assistant

## 2017-03-22 NOTE — Telephone Encounter (Signed)
Refill appropriate 

## 2017-04-03 ENCOUNTER — Ambulatory Visit (INDEPENDENT_AMBULATORY_CARE_PROVIDER_SITE_OTHER): Payer: Medicare HMO | Admitting: Pharmacist

## 2017-04-03 DIAGNOSIS — Z5181 Encounter for therapeutic drug level monitoring: Secondary | ICD-10-CM | POA: Diagnosis not present

## 2017-04-03 DIAGNOSIS — Z7901 Long term (current) use of anticoagulants: Secondary | ICD-10-CM | POA: Diagnosis not present

## 2017-04-03 DIAGNOSIS — I4891 Unspecified atrial fibrillation: Secondary | ICD-10-CM | POA: Diagnosis not present

## 2017-04-03 LAB — POCT INR: INR: 3

## 2017-04-19 ENCOUNTER — Ambulatory Visit (INDEPENDENT_AMBULATORY_CARE_PROVIDER_SITE_OTHER): Payer: Medicare HMO | Admitting: Physician Assistant

## 2017-04-19 ENCOUNTER — Other Ambulatory Visit: Payer: Self-pay | Admitting: Physician Assistant

## 2017-04-19 VITALS — BP 130/68 | HR 78 | Temp 97.9°F | Resp 22 | Ht 68.0 in | Wt 248.0 lb

## 2017-04-19 DIAGNOSIS — R1012 Left upper quadrant pain: Secondary | ICD-10-CM | POA: Diagnosis not present

## 2017-04-19 DIAGNOSIS — R1032 Left lower quadrant pain: Secondary | ICD-10-CM

## 2017-04-19 DIAGNOSIS — R110 Nausea: Secondary | ICD-10-CM | POA: Diagnosis not present

## 2017-04-19 DIAGNOSIS — I10 Essential (primary) hypertension: Secondary | ICD-10-CM

## 2017-04-19 DIAGNOSIS — R109 Unspecified abdominal pain: Secondary | ICD-10-CM | POA: Diagnosis not present

## 2017-04-19 LAB — COMPLETE METABOLIC PANEL WITH GFR
AG Ratio: 1.3 (calc) (ref 1.0–2.5)
ALKALINE PHOSPHATASE (APISO): 54 U/L (ref 40–115)
ALT: 24 U/L (ref 9–46)
AST: 16 U/L (ref 10–35)
Albumin: 3.5 g/dL — ABNORMAL LOW (ref 3.6–5.1)
BUN: 15 mg/dL (ref 7–25)
CO2: 27 mmol/L (ref 20–32)
CREATININE: 1.11 mg/dL (ref 0.70–1.18)
Calcium: 9.3 mg/dL (ref 8.6–10.3)
Chloride: 98 mmol/L (ref 98–110)
GFR, Est African American: 78 mL/min/{1.73_m2} (ref >=60)
GFR, Est Non African American: 67 mL/min/{1.73_m2} (ref >=60)
Globulin: 2.8 g/dL (calc) (ref 1.9–3.7)
Glucose, Bld: 100 mg/dL — ABNORMAL HIGH (ref 65–99)
Potassium: 4.4 mmol/L (ref 3.5–5.3)
Sodium: 135 mmol/L (ref 135–146)
TOTAL PROTEIN: 6.3 g/dL (ref 6.1–8.1)
Total Bilirubin: 0.5 mg/dL (ref 0.2–1.2)

## 2017-04-19 LAB — CBC
HCT: 39.9 % (ref 38.5–50.0)
HEMOGLOBIN: 13.3 g/dL (ref 13.2–17.1)
MCH: 31 pg (ref 27.0–33.0)
MCHC: 33.3 g/dL (ref 32.0–36.0)
MCV: 93 fL (ref 80.0–100.0)
PLATELETS: 370 10*3/uL (ref 140–400)
RBC: 4.29 10*6/uL (ref 4.20–5.80)
RDW: 13.3 % (ref 11.0–15.0)
WBC: 10.9 10*3/uL — ABNORMAL HIGH (ref 3.8–10.8)

## 2017-04-19 NOTE — Progress Notes (Signed)
Patient ID: William Stanley MRN: [id number], DOB: November 19, 1945, 71 y.o. Date of Encounter: '@DATE'$ @  Chief Complaint:  Chief Complaint  Patient presents with  . Pain in left side - swollen    HPI: 71 y.o. year old male  presents with above.  He reports that he first started noticing the symptoms on Sunday evening which was 04/15/17.  Rubs his hand up and down the left side of his abdomen and says that "that whole area feels swollen to me ".  Says this "swollen feeling on left side" is what he started noticing on Sunday night.  Then on Monday morning --when he ate breakfast-- he felt nauseous and felt pain in his left abdomen.  Says that again this morning he ate and then felt pain in his left abdomen.  Says that yesterday he had 5 very loose stools within a three-hour period of time.  Says that he has had no bowel movement today so far.  Says that he "has had IBS for years" but says that this discomfort/these symptoms are different than any symptoms he has had with past IBS.  He has had no known fevers or chills. Has had no actual vomiting but has felt nauseous after eating multiple times over the last few days.   Past Medical History:  Diagnosis Date  . Atrial fibrillation ([place])   . DVT (deep venous thrombosis) ([place])   . Edema of lower extremity   . Hypertension   . Lung collapse    LEFT LUNG, TWICE  . Pulmonary embolism ([place])      Home Meds: Outpatient Medications Prior to Visit  Medication Sig Dispense Refill  . ACCU-CHEK AVIVA PLUS test strip CHECK BLOOD SUGAR THREE TIMES DAILY 250 each 0  . ACCU-CHEK SOFTCLIX LANCETS lancets CHECK BLOOD SUGAR THREE TIMES DAILY 200 each 0  . albuterol (VENTOLIN HFA) 108 (90 Base) MCG/ACT inhaler INHALE 1-2 PUFFS INTO LUNGS Every 4 hours AS NEEDED 18 g 1  . Alcohol Swabs (ALCOHOL PADS) 70 % PADS Use to check blood sugar daily 100 each 11  . amLODipine (NORVASC) 5 MG tablet TAKE 1 TABLET BY MOUTH EVERY DAY 90 tablet 1  . benazepril  (LOTENSIN) 10 MG tablet TAKE 1 TABLET BY MOUTH EVERY DAY 90 tablet 1  . blood glucose meter kit and supplies KIT Dispense based on patient and insurance preference. Check blood sugar three times daily  (FOR ICD-10E11.5). 1 each 0  . cetirizine (ZYRTEC) 10 MG tablet Take 1 tablet (10 mg total) by mouth daily. 30 tablet 11  . fluticasone (FLONASE) 50 MCG/ACT nasal spray Place 2 sprays into both nostrils daily. 16 g 6  . metFORMIN (GLUCOPHAGE) 500 MG tablet Take 1 tablet (500 mg total) by mouth 2 (two) times daily with a meal. 60 tablet 5  . pravastatin (PRAVACHOL) 20 MG tablet Take 1 tablet (20 mg total) by mouth daily. 90 tablet 3  . tamsulosin (FLOMAX) 0.4 MG CAPS capsule Take 1 capsule (0.4 mg total) by mouth daily. 90 capsule 1  . VENTOLIN HFA 108 (90 Base) MCG/ACT inhaler INHALE 1-2 PUFFS INTO LUNGS AS NEEDED 18 g 3  . warfarin (COUMADIN) 5 MG tablet Take as directed by [facility] Clinic 135 tablet 1   No facility-administered medications prior to visit.     Allergies:  Allergies  Allergen Reactions  . Penicillins     Social History   Social History  . Marital status: Married    Spouse name: N/A  .  Number of children: N/A  . Years of education: N/A   Occupational History  . Not on file.   Social History Main Topics  . Smoking status: Former Smoker    Packs/day: 0.25    Types: Cigarettes    Quit date: 07/07/2012  . Smokeless tobacco: Never Used     Comment: pk a week  . Alcohol use No  . Drug use: No  . Sexual activity: Not on file   Other Topics Concern  . Not on file   Social History Narrative  . No narrative on file    Family History  Problem Relation Age of Onset  . Heart attack Mother   . Heart failure Mother   . Arrhythmia Father   . Heart failure Father      Review of Systems:  See HPI for pertinent ROS. All other ROS negative.    Physical Exam: Blood pressure 130/68, pulse 78, temperature 97.9 F (36.6 C), temperature source Oral, resp. rate (!) 22,  height 5\' 8"  (1.727 m), weight 248 lb (112.5 kg)., Body mass index is 37.71 kg/m. General: Obese WM. Appears in no acute distress. Neck: Supple. No thyromegaly. No lymphadenopathy. Lungs: Clear bilaterally to auscultation without wheezes, rales, or rhonchi. Breathing is unlabored. Heart: Irregular Rhythm Abdomen: Soft,  non-distended with normoactive bowel sounds. No hepatomegaly. No rebound/guarding. No obvious abdominal masses. There is tenderness with palpation of left abdomen, about midway down , on left side. Musculoskeletal:  Strength and tone normal for age. Extremities/Skin: Warm and dry.  Neuro: Alert and oriented X 3. Moves all extremities spontaneously. Gait is normal. CNII-XII grossly in tact. Psych:  Responds to questions appropriately with a normal affect.   Results for orders placed or performed in visit on 04/19/17  CBC  Result Value Ref Range   WBC 10.9 (H) 3.8 - 10.8 Thousand/uL   RBC 4.29 4.20 - 5.80 Million/uL   Hemoglobin 13.3 13.2 - 17.1 g/dL   HCT 04/21/17 49.3 - 83.0 %   MCV 93.0 80.0 - 100.0 fL   MCH 31.0 27.0 - 33.0 pg   MCHC 33.3 32.0 - 36.0 g/dL   RDW 67.8 82.1 - 37.8 %   Platelets 370 140 - 400 Thousand/uL     ASSESSMENT AND PLAN:  71 y.o. year old male with   1. Left sided abdominal pain CBC run here in the office and those results have been reviewed with patient here in the office. Will send CMETand obtain CT. He has eaten today so CT will be scheduled for tomorrow morning. In the interim he is to stick with clear liquid diet for the remainder of the day and follow instructions regarding preparation for the CT. Will follow-up results of CT for further recommendations at that time. Discussed indications to go to the ER if develops severe localized pain, fever. - CBC - COMPLETE METABOLIC PANEL WITH GFR - CT Abdomen Pelvis W Contrast; Future  2. Nausea CBC run here in the office and those results have been reviewed with patient here in the  office. Will send CMETand obtain CT. He has eaten today so CT will be scheduled for tomorrow morning. In the interim he is to stick with clear liquid diet for the remainder of the day and follow instructions regarding preparation for the CT. Will follow-up results of CT for further recommendations at that time. Discussed indications to go to the ER if develops severe localized pain, fever.  - CBC - COMPLETE METABOLIC PANEL WITH GFR -  CT Abdomen Pelvis W Contrast; Future  3. Left lower quadrant pain CBC run here in the office and those results have been reviewed with patient here in the office. Will send CMETand obtain CT. He has eaten today so CT will be scheduled for tomorrow morning. In the interim he is to stick with clear liquid diet for the remainder of the day and follow instructions regarding preparation for the CT. Will follow-up results of CT for further recommendations at that time. Discussed indications to go to the ER if develops severe localized pain, fever.  - CT Abdomen Pelvis W Contrast; Future    Signed, Olean Ree [place], [place], [facility] 04/19/2017 12:21 PM

## 2017-04-20 ENCOUNTER — Ambulatory Visit
Admission: RE | Admit: 2017-04-20 | Discharge: 2017-04-20 | Disposition: A | Discharge status: Home / Self Care | Payer: Medicare HMO | Source: Ambulatory Visit | Attending: Family Medicine | Admitting: Family Medicine

## 2017-04-20 ENCOUNTER — Ambulatory Visit (HOSPITAL_COMMUNITY): Payer: Medicare HMO

## 2017-04-20 ENCOUNTER — Ambulatory Visit
Admission: RE | Admit: 2017-04-20 | Discharge: 2017-04-20 | Disposition: A | Discharge status: Home / Self Care | Payer: Medicare HMO | Source: Ambulatory Visit | Attending: Physician Assistant | Admitting: Physician Assistant

## 2017-04-20 ENCOUNTER — Telehealth: Payer: Self-pay | Admitting: Family Medicine

## 2017-04-20 DIAGNOSIS — K760 Fatty (change of) liver, not elsewhere classified: Secondary | ICD-10-CM | POA: Diagnosis not present

## 2017-04-20 DIAGNOSIS — R110 Nausea: Secondary | ICD-10-CM

## 2017-04-20 DIAGNOSIS — J90 Pleural effusion, not elsewhere classified: Secondary | ICD-10-CM

## 2017-04-20 DIAGNOSIS — R109 Unspecified abdominal pain: Secondary | ICD-10-CM

## 2017-04-20 DIAGNOSIS — R1012 Left upper quadrant pain: Secondary | ICD-10-CM

## 2017-04-20 DIAGNOSIS — R1032 Left lower quadrant pain: Secondary | ICD-10-CM

## 2017-04-20 MED ORDER — IOPAMIDOL (ISOVUE-300) INJECTION 61%
125.0000 mL | Freq: Once | INTRAVENOUS | Status: AC | PRN
  Administered 2017-04-20: 125 mL via INTRAVENOUS

## 2017-04-20 MED ORDER — LEVOFLOXACIN 500 MG PO TABS: 500.0000 mg | ORAL_TABLET | Freq: Every day | ORAL | 0 refills | Status: AC

## 2017-04-20 NOTE — Telephone Encounter (Signed)
-----   Message from Alycia Rossetti, MD sent at 04/20/2017 10:53 AM EDT ----- Call pt CT scan shows probable PNA on left side, he was having left sided abdominal pain Start Levaquin 500mg  daily x 7 days, monitor for any bleeding as he is on coumadin and this will change his INR He needs a CT of chest done in 1 week, as he had fluid and opacity in chest ( Which looks more like PNA but need to exclude underlying mass in lungs) If he has any difficulty breathing, chest pain, needs to go to ER  Keep appt for next week with Olean Ree, his Coumadin can be checked at that visit

## 2017-04-20 NOTE — Telephone Encounter (Deleted)
-----   Message from Alycia Rossetti, MD sent at 04/20/2017 10:53 AM EDT ----- Call pt CT scan shows probable PNA on left side, he was having left sided abdominal pain Start Levaquin 500mg  daily x 7 days, monitor for any bleeding as he is on coumadin and this will change his INR He needs a CT of chest done in 1 week, as he had fluid and opacity in chest ( Which looks more like PNA but need to exclude underlying mass in lungs) If he has any difficulty breathing, chest pain, needs to go to ER  Keep appt for next week with Olean Ree, his Coumadin can be checked at that visit

## 2017-04-20 NOTE — Telephone Encounter (Signed)
Spoke to patient.  Aware of CT results and provider recommendations.  Cautioned about Levaquin and Coumadin.  Told to go to ED if any unusual bleeding, chest pain, shortness of breath.  He has opted to have CXR done.  Told him order will be placed and he can go anytime. Reinforced to keep appt Wednesday with provider.

## 2017-04-25 ENCOUNTER — Ambulatory Visit (INDEPENDENT_AMBULATORY_CARE_PROVIDER_SITE_OTHER): Payer: Medicare HMO | Admitting: Physician Assistant

## 2017-04-25 ENCOUNTER — Encounter: Payer: Self-pay | Admitting: Physician Assistant

## 2017-04-25 VITALS — BP 138/64 | HR 68 | Temp 97.5°F | Resp 16 | Wt 247.8 lb

## 2017-04-25 DIAGNOSIS — I4891 Unspecified atrial fibrillation: Secondary | ICD-10-CM | POA: Diagnosis not present

## 2017-04-25 DIAGNOSIS — J181 Lobar pneumonia, unspecified organism: Secondary | ICD-10-CM

## 2017-04-25 DIAGNOSIS — Z5181 Encounter for therapeutic drug level monitoring: Secondary | ICD-10-CM | POA: Diagnosis not present

## 2017-04-25 DIAGNOSIS — Z7901 Long term (current) use of anticoagulants: Secondary | ICD-10-CM | POA: Diagnosis not present

## 2017-04-25 DIAGNOSIS — J189 Pneumonia, unspecified organism: Secondary | ICD-10-CM

## 2017-04-25 LAB — PT WITH INR/FINGERSTICK
INR FINGERSTICK: 3.1 ratio — AB
PT FINGERSTICK: 36.8 s — AB (ref 10.5–13.1)

## 2017-04-25 NOTE — Progress Notes (Signed)
Patient ID: RUBENS CRANSTON MRN: [id number], DOB: 1945/11/16, 71 y.o. Date of Encounter: _0 @  Chief Complaint:  Chief Complaint  Patient presents with  . F/U Pneumonia    HPI: 71 y.o. year old male  presents for f/u.   04/19/2017: He reports that he first started noticing the symptoms on Sunday evening which was 04/15/17.  Rubs his hand up and down the left side of his abdomen and says that "that whole area feels swollen to me ".  Says this "swollen feeling on left side" is what he started noticing on Sunday night.  Then on Monday morning --when he ate breakfast-- he felt nauseous and felt pain in his left abdomen.  Says that again this morning he ate and then felt pain in his left abdomen.  Says that yesterday he had 5 very loose stools within a three-hour period of time.  Says that he has had no bowel movement today so far.  Says that he "has had IBS for years" but says that this discomfort/these symptoms are different than any symptoms he has had with past IBS.  He has had no known fevers or chills. Has had no actual vomiting but has felt nauseous after eating multiple times over the last few days.  AT THAT OV: CBC run here in the office and those results have been reviewed with patient here in the office. Will send CMETand obtain CT. He has eaten today so CT will be scheduled for tomorrow morning. In the interim he is to stick with clear liquid diet for the remainder of the day and follow instructions regarding preparation for the CT. Will follow-up results of CT for further recommendations at that time. Discussed indications to go to the ER if develops severe localized pain, fever. - CBC - COMPLETE METABOLIC PANEL WITH GFR - CT Abdomen Pelvis W Contrast; Future  IN F/U OF THAT OV: Labs revealed no significant abnormality.CMET was normal. WBC 10.9. Remainder of CBC normal.   CT abdomen pelvis was obtained and was performed on 04/20/17. This revealed : "Moderate left  pleural effusion with mass like opacity partially imaged in the left lower lobe. This could represent pneumonia. Cannot completely exclude pulmonary mass. Consider further evaluation with two-view chest x-ray or chest CT. No acute findings in the abdomen or pelvis."  Dr. Buelah Manis was informed of the findings and made the following recommendations: Start Levaquin 500 milligrams daily for 7 days, monitor for any bleeding as he is on Coumadin and this will change his INR. Was to keep his appointment with me in one week and to recheck his Coumadin level at that visit. Reviewed that he would need follow-up imaging as well.  He did have chest x-ray performed on 04/20/17, as well, to further evaluate. Chest x-ray showed: "Left lower lobe pneumonia with adjacent small left pleural effusion. Follow-up PA and lateral chest x-ray is recommended in 3-4 weeks following trial of antibiotic therapy to ensure resolution and exclude underlying malignancy."    TODAY: Today he reports that he has been taking the Levaquin as directed. He reports that he has been having some cough productive of phlegm. Reports that he is still feeling weak and still really not feeling good. Reports that he does still feel some discomfort in the left side of his body. No other specific concerns. Is having no fevers or chills.    Past Medical History:  Diagnosis Date  . Atrial fibrillation ([place])   . DVT (deep venous thrombosis) ([place])   .  Edema of lower extremity   . Hypertension   . Lung collapse    LEFT LUNG, TWICE  . Pulmonary embolism ([place])      Home Meds: Outpatient Medications Prior to Visit  Medication Sig Dispense Refill  . ACCU-CHEK AVIVA PLUS test strip CHECK BLOOD SUGAR THREE TIMES DAILY 250 each 0  . ACCU-CHEK SOFTCLIX LANCETS lancets CHECK BLOOD SUGAR THREE TIMES DAILY 200 each 0  . albuterol (VENTOLIN HFA) 108 (90 Base) MCG/ACT inhaler INHALE 1-2 PUFFS INTO LUNGS Every 4 hours AS NEEDED 18 g 1  . Alcohol Swabs  (ALCOHOL PADS) 70 % PADS Use to check blood sugar daily 100 each 11  . amLODipine (NORVASC) 5 MG tablet TAKE 1 TABLET BY MOUTH EVERY DAY 90 tablet 1  . benazepril (LOTENSIN) 10 MG tablet TAKE 1 TABLET BY MOUTH EVERY DAY 90 tablet 1  . blood glucose meter kit and supplies KIT Dispense based on patient and insurance preference. Check blood sugar three times daily  (FOR ICD-10E11.5). 1 each 0  . cetirizine (ZYRTEC) 10 MG tablet Take 1 tablet (10 mg total) by mouth daily. 30 tablet 11  . fluticasone (FLONASE) 50 MCG/ACT nasal spray Place 2 sprays into both nostrils daily. 16 g 6  . levofloxacin (LEVAQUIN) 500 MG tablet Take 1 tablet (500 mg total) by mouth daily. 7 tablet 0  . metFORMIN (GLUCOPHAGE) 500 MG tablet Take 1 tablet (500 mg total) by mouth 2 (two) times daily with a meal. 60 tablet 5  . pravastatin (PRAVACHOL) 20 MG tablet Take 1 tablet (20 mg total) by mouth daily. 90 tablet 3  . tamsulosin (FLOMAX) 0.4 MG CAPS capsule Take 1 capsule (0.4 mg total) by mouth daily. 90 capsule 1  . VENTOLIN HFA 108 (90 Base) MCG/ACT inhaler INHALE 1-2 PUFFS INTO LUNGS AS NEEDED 18 g 3  . warfarin (COUMADIN) 5 MG tablet Take as directed by [facility] 135 tablet 1   No facility-administered medications prior to visit.     Allergies:  Allergies  Allergen Reactions  . Penicillins     Social History   Social History  . Marital status: Married    Spouse name: N/A  . Number of children: N/A  . Years of education: N/A   Occupational History  . Not on file.   Social History Main Topics  . Smoking status: Former Smoker    Packs/day: 0.25    Types: Cigarettes    Quit date: 07/07/2012  . Smokeless tobacco: Never Used     Comment: pk a week  . Alcohol use No  . Drug use: No  . Sexual activity: Not on file   Other Topics Concern  . Not on file   Social History Narrative  . No narrative on file    Family History  Problem Relation Age of Onset  . Heart attack Mother   . Heart failure  Mother   . Arrhythmia Father   . Heart failure Father      Review of Systems:  See HPI for pertinent ROS. All other ROS negative.    Physical Exam: Blood pressure 138/64, pulse 68, temperature (!) 97.5 F (36.4 C), temperature source Oral, resp. rate 16, weight 112.4 kg (247 lb 12.8 oz), SpO2 97 %., Body mass index is 37.68 kg/m. General: Obese WM. Appears in no acute distress. Neck: Supple. No thyromegaly. No lymphadenopathy. Lungs: Clear bilaterally to auscultation without wheezes, rales, or rhonchi. Breathing is unlabored. Heart: Irregular Rhythm Abdomen: Soft,  non-distended with normoactive  bowel sounds. No hepatomegaly. No rebound/guarding. No obvious abdominal masses. There is tenderness with palpation of left abdomen, about midway down , on left side. Musculoskeletal:  Strength and tone normal for age. Extremities/Skin: Warm and dry.  Neuro: Alert and oriented X 3. Moves all extremities spontaneously. Gait is normal. CNII-XII grossly in tact. Psych:  Responds to questions appropriately with a normal affect.    Results for orders placed or performed in visit on 04/25/17  PT with INR/Fingerstick  Result Value Ref Range   INR, fingerstick 3.1 (H) ratio   PT, fingerstick 36.8 (H) 10.5 - 13.1 sec     ASSESSMENT AND PLAN:  71 y.o. year old male with   1. Encounter for therapeutic drug monitoring Discussed that INR is 3.1. He can continue his usual dosing on Coumadin. - PT with INR/Fingerstick  2. Warfarin anticoagulation Discussed that INR is 3.1. He can continue his usual dosing on Coumadin. - PT with INR/Fingerstick  3. Atrial fibrillation, unspecified type ([place]) Discussed that INR is 3.1. He can continue his usual dosing on Coumadin. - PT with INR/Fingerstick  4. Community acquired pneumonia of left lower lobe of lung ([place]) He is to complete all of the Levaquin. Recommend that he also use Mucinex DM as expectorant. Explained to him that even after the antibiotic  is killed off the infection, it will take his body time to regain it strength. Discussed need for follow-up chest x-ray in 3 weeks and he voices understanding and agrees. - DG Chest 2 View; Future  Reviewed that his last routine office visit for routine labs and management of chronic medical problems was 01/22/17.  He usually has routine office visit every 3 months so ROV is due. Will have him go for chest x-ray on Monday, October 8 and will have him come see me for office visit Wednesday 05/16/17. At that visit I will follow up that chest x-ray report and things from this visit today and will also follow-up on his chronic medical problems/ management and labs.  He voices understanding and agrees and will go for that follow-up chest x-ray.    Signed, [address] [place], [place], [facility] 04/25/2017 1:19 PM

## 2017-05-14 ENCOUNTER — Other Ambulatory Visit: Payer: Self-pay | Admitting: Physician Assistant

## 2017-05-15 ENCOUNTER — Ambulatory Visit (INDEPENDENT_AMBULATORY_CARE_PROVIDER_SITE_OTHER): Payer: Medicare HMO | Admitting: *Deleted

## 2017-05-15 ENCOUNTER — Ambulatory Visit
Admission: RE | Admit: 2017-05-15 | Discharge: 2017-05-15 | Disposition: A | Discharge status: Home / Self Care | Payer: Medicare HMO | Source: Ambulatory Visit | Attending: Physician Assistant | Admitting: Physician Assistant

## 2017-05-15 DIAGNOSIS — Z5181 Encounter for therapeutic drug level monitoring: Secondary | ICD-10-CM

## 2017-05-15 DIAGNOSIS — J181 Lobar pneumonia, unspecified organism: Secondary | ICD-10-CM | POA: Diagnosis not present

## 2017-05-15 DIAGNOSIS — I4891 Unspecified atrial fibrillation: Secondary | ICD-10-CM | POA: Diagnosis not present

## 2017-05-15 DIAGNOSIS — Z7901 Long term (current) use of anticoagulants: Secondary | ICD-10-CM

## 2017-05-15 DIAGNOSIS — J189 Pneumonia, unspecified organism: Secondary | ICD-10-CM

## 2017-05-15 DIAGNOSIS — J90 Pleural effusion, not elsewhere classified: Secondary | ICD-10-CM

## 2017-05-15 LAB — POCT INR: INR: 2.4

## 2017-05-16 ENCOUNTER — Other Ambulatory Visit: Payer: Self-pay | Admitting: Family Medicine

## 2017-05-16 ENCOUNTER — Encounter: Payer: Self-pay | Admitting: Physician Assistant

## 2017-05-16 ENCOUNTER — Ambulatory Visit (INDEPENDENT_AMBULATORY_CARE_PROVIDER_SITE_OTHER): Payer: Medicare HMO | Admitting: Physician Assistant

## 2017-05-16 VITALS — BP 132/72 | HR 60 | Temp 97.9°F | Resp 16 | Ht 68.0 in | Wt 249.0 lb

## 2017-05-16 DIAGNOSIS — R05 Cough: Secondary | ICD-10-CM

## 2017-05-16 DIAGNOSIS — R9389 Abnormal findings on diagnostic imaging of other specified body structures: Secondary | ICD-10-CM

## 2017-05-16 DIAGNOSIS — R059 Cough, unspecified: Secondary | ICD-10-CM

## 2017-05-16 DIAGNOSIS — R0602 Shortness of breath: Secondary | ICD-10-CM | POA: Diagnosis not present

## 2017-05-16 NOTE — Progress Notes (Signed)
Patient ID: William Stanley MRN: [id number], DOB: 10-Oct-1945, 71 y.o. Date of Encounter: _0 @  Chief Complaint:  Chief Complaint  Patient presents with  . F/U Pneumonia    HPI: 71 y.o. year old male  presents for f/u.   04/19/2017: He reports that he first started noticing the symptoms on Sunday evening which was 04/15/17.  Rubs his hand up and down the left side of his abdomen and says that "that whole area feels swollen to me ".  Says this "swollen feeling on left side" is what he started noticing on Sunday night.  Then on Monday morning --when he ate breakfast-- he felt nauseous and felt pain in his left abdomen.  Says that again this morning he ate and then felt pain in his left abdomen.  Says that yesterday he had 5 very loose stools within a three-hour period of time.  Says that he has had no bowel movement today so far.  Says that he "has had IBS for years" but says that this discomfort/these symptoms are different than any symptoms he has had with past IBS.  He has had no known fevers or chills. Has had no actual vomiting but has felt nauseous after eating multiple times over the last few days.  AT THAT OV: CBC run here in the office and those results have been reviewed with patient here in the office. Will send CMETand obtain CT. He has eaten today so CT will be scheduled for tomorrow morning. In the interim he is to stick with clear liquid diet for the remainder of the day and follow instructions regarding preparation for the CT. Will follow-up results of CT for further recommendations at that time. Discussed indications to go to the ER if develops severe localized pain, fever. - CBC - COMPLETE METABOLIC PANEL WITH GFR - CT Abdomen Pelvis W Contrast; Future  IN F/U OF THAT OV: Labs revealed no significant abnormality.CMET was normal. WBC 10.9. Remainder of CBC normal.   CT abdomen pelvis was obtained and was performed on 04/20/17. This revealed : "Moderate left  pleural effusion with mass like opacity partially imaged in the left lower lobe. This could represent pneumonia. Cannot completely exclude pulmonary mass. Consider further evaluation with two-view chest x-ray or chest CT. No acute findings in the abdomen or pelvis."  Dr. Buelah Manis was informed of the findings and made the following recommendations: Start Levaquin 500 milligrams daily for 7 days, monitor for any bleeding as he is on Coumadin and this will change his INR. Was to keep his appointment with me in one week and to recheck his Coumadin level at that visit. Reviewed that he would need follow-up imaging as well.  He did have chest x-ray performed on 04/20/17, as well, to further evaluate. Chest x-ray showed: "Left lower lobe pneumonia with adjacent small left pleural effusion. Follow-up PA and lateral chest x-ray is recommended in 3-4 weeks following trial of antibiotic therapy to ensure resolution and exclude underlying malignancy."    04/25/2017: Today he reports that he has been taking the Levaquin as directed. He reports that he has been having some cough productive of phlegm. Reports that he is still feeling weak and still really not feeling good. Reports that he does still feel some discomfort in the left side of his body. No other specific concerns. Is having no fevers or chills.   05/16/2017: Today he comes in for follow-up. Reports that he did complete the Levaquin. Reports that he is still having  some cough. Says that if he tries to lie down, he starts coughing and feels like there is phlegm in his throat. Says that Friday, when he coughed, he saw some blood in the phlegm. Asked if he is still having the discomfort on the left side of his body that he was having at his initial visit with me. Says that that "swollen feeling "is resolved. Says that he really is not having any pain or discomfort at this point --- just the cough and SOB.  He had follow-up chest x-ray yesterday  05/15/2017. I reviewed those findings with him today. "Impression: 1 persistent left lower lobe airspace opacity. Given the prolonged time frame, CT of the chest with contrast is recommended for further evaluation of possible proximal obstructing lesion. 2 slight decrease in left pleural effusion 3 minimal right basilar atelectasis   He has no other concerns to address today. I discussed with him that at this point we need to obtain the follow-up CT.  Will wait to make any further recommendations/treatment plan once we get results of CT chest.    Past Medical History:  Diagnosis Date  . Atrial fibrillation ([place])   . DVT (deep venous thrombosis) ([place])   . Edema of lower extremity   . Hypertension   . Lung collapse    LEFT LUNG, TWICE  . Pulmonary embolism ([place])      Home Meds: Outpatient Medications Prior to Visit  Medication Sig Dispense Refill  . ACCU-CHEK AVIVA PLUS test strip CHECK BLOOD SUGAR THREE TIMES DAILY 250 each 0  . ACCU-CHEK SOFTCLIX LANCETS lancets CHECK BLOOD SUGAR THREE TIMES DAILY 200 each 0  . albuterol (VENTOLIN HFA) 108 (90 Base) MCG/ACT inhaler INHALE 1-2 PUFFS INTO LUNGS Every 4 hours AS NEEDED 18 g 1  . Alcohol Swabs (ALCOHOL PADS) 70 % PADS Use to check blood sugar daily 100 each 11  . amLODipine (NORVASC) 5 MG tablet TAKE 1 TABLET BY MOUTH EVERY DAY 90 tablet 1  . benazepril (LOTENSIN) 10 MG tablet TAKE 1 TABLET BY MOUTH EVERY DAY 90 tablet 1  . blood glucose meter kit and supplies KIT Dispense based on patient and insurance preference. Check blood sugar three times daily  (FOR ICD-10E11.5). 1 each 0  . cetirizine (ZYRTEC) 10 MG tablet Take 1 tablet (10 mg total) by mouth daily. 30 tablet 11  . fluticasone (FLONASE) 50 MCG/ACT nasal spray Place 2 sprays into both nostrils daily. 16 g 6  . metFORMIN (GLUCOPHAGE) 500 MG tablet TAKE 1 TABLET BY MOUTH TWICE DAILY WITH MEALS 60 tablet 0  . pravastatin (PRAVACHOL) 20 MG tablet Take 1 tablet (20 mg total) by  mouth daily. 90 tablet 3  . tamsulosin (FLOMAX) 0.4 MG CAPS capsule Take 1 capsule (0.4 mg total) by mouth daily. 90 capsule 1  . VENTOLIN HFA 108 (90 Base) MCG/ACT inhaler INHALE 1-2 PUFFS INTO LUNGS AS NEEDED 18 g 3  . warfarin (COUMADIN) 5 MG tablet Take as directed by [facility] 135 tablet 1   No facility-administered medications prior to visit.     Allergies:  Allergies  Allergen Reactions  . Penicillins     Social History   Social History  . Marital status: Married    Spouse name: N/A  . Number of children: N/A  . Years of education: N/A   Occupational History  . Not on file.   Social History Main Topics  . Smoking status: Former Smoker    Packs/day: 0.25    Types:  Cigarettes    Quit date: 07/07/2012  . Smokeless tobacco: Never Used     Comment: pk a week  . Alcohol use No  . Drug use: No  . Sexual activity: Not on file   Other Topics Concern  . Not on file   Social History Narrative  . No narrative on file    Family History  Problem Relation Age of Onset  . Heart attack Mother   . Heart failure Mother   . Arrhythmia Father   . Heart failure Father      Review of Systems:  See HPI for pertinent ROS. All other ROS negative.    Physical Exam: Blood pressure 132/72, pulse 60, temperature 97.9 F (36.6 C), temperature source Oral, resp. rate 16, height _0  (1.727 m), weight 112.9 kg (249 lb), SpO2 97 %., Body mass index is 37.86 kg/m. General: Obese WM. Appears in no acute distress. Neck: Supple. No thyromegaly. No lymphadenopathy. Lungs: Decreased breath sounds at left base. Otherwise lungs are clear with good breath sounds throughout. I hear no wheezes rhonchi or rales. Heart: Irregular Rhythm Abdomen: Soft,  non-distended with normoactive bowel sounds. No hepatomegaly. No rebound/guarding. No obvious abdominal masses.  Musculoskeletal:  Strength and tone normal for age. Extremities/Skin: Warm and dry.  Neuro: Alert and oriented X 3. Moves  all extremities spontaneously. Gait is normal. CNII-XII grossly in tact. Psych:  Responds to questions appropriately with a normal affect.     ASSESSMENT AND PLAN:  71 y.o. year old male with   1. Cough  2. Shortness of breath  3. Abnormal chest x-ray  While the patient was here in the office, I spoke with our referral coordinator/scheduler. She is going to work on his order now. She has placed order for CT chest with contrast--- follow-up abnormal chest x-ray--- she will call him with this appointment time.  I will follow up with patient as soon as I get CT report.   Signed, [address] [place], [place], [facility] 05/16/2017 10:34 AM

## 2017-05-17 ENCOUNTER — Ambulatory Visit (INDEPENDENT_AMBULATORY_CARE_PROVIDER_SITE_OTHER): Payer: Medicare HMO | Admitting: Physician Assistant

## 2017-05-17 ENCOUNTER — Ambulatory Visit
Admission: RE | Admit: 2017-05-17 | Discharge: 2017-05-17 | Disposition: A | Discharge status: Home / Self Care | Payer: Medicare HMO | Source: Ambulatory Visit | Attending: Physician Assistant | Admitting: Physician Assistant

## 2017-05-17 DIAGNOSIS — J90 Pleural effusion, not elsewhere classified: Secondary | ICD-10-CM

## 2017-05-17 DIAGNOSIS — Z712 Person consulting for explanation of examination or test findings: Secondary | ICD-10-CM | POA: Diagnosis not present

## 2017-05-17 DIAGNOSIS — J181 Lobar pneumonia, unspecified organism: Secondary | ICD-10-CM | POA: Diagnosis not present

## 2017-05-17 MED ORDER — IOPAMIDOL (ISOVUE-300) INJECTION 61%
75.0000 mL | Freq: Once | INTRAVENOUS | Status: AC | PRN
  Administered 2017-05-17: 75 mL via INTRAVENOUS

## 2017-05-21 NOTE — Progress Notes (Signed)
    Patient ID: William Stanley MRN: [id number], DOB: 18-Aug-1945, 71 y.o. Date of Encounter: 05/21/2017, 7:35 AM    Chief Complaint:  Discuss Results of CT   HPI: 71 y.o. year old male presents to discuss results of CT scan performed today.  CT scan performed earlier today showed:  left lower lobe lung mass with associated postobstructive pneumonitis is identified and highly worrisome for primary bronchogenic carcinoma. Further investigation with PET CT and tissue sampling is advised. Enlarged left hilar and sub- carinal lymph nodes. Left pleural effusion. Aortic atherosclerosis. Coronary artery calcifications.   I discussed the results with him. I recommended that I go ahead and put in order for referral to pulmonology and oncology. Both of these referrals have been ordered and placed as urgent. He is agreeable with this plan and will follow-up with these appointments for further information to then make decisions regarding further management.  Home Meds:   Outpatient Medications Prior to Visit  Medication Sig Dispense Refill  . ACCU-CHEK AVIVA PLUS test strip CHECK BLOOD SUGAR THREE TIMES DAILY 250 each 0  . ACCU-CHEK SOFTCLIX LANCETS lancets CHECK BLOOD SUGAR THREE TIMES DAILY 200 each 0  . albuterol (VENTOLIN HFA) 108 (90 Base) MCG/ACT inhaler INHALE 1-2 PUFFS INTO LUNGS Every 4 hours AS NEEDED 18 g 1  . Alcohol Swabs (ALCOHOL PADS) 70 % PADS Use to check blood sugar daily 100 each 11  . amLODipine (NORVASC) 5 MG tablet TAKE 1 TABLET BY MOUTH EVERY DAY 90 tablet 1  . benazepril (LOTENSIN) 10 MG tablet TAKE 1 TABLET BY MOUTH EVERY DAY 90 tablet 1  . blood glucose meter kit and supplies KIT Dispense based on patient and insurance preference. Check blood sugar three times daily  (FOR ICD-10E11.5). 1 each 0  . cetirizine (ZYRTEC) 10 MG tablet Take 1 tablet (10 mg total) by mouth daily. 30 tablet 11  . fluticasone (FLONASE) 50 MCG/ACT nasal spray Place 2 sprays into both nostrils  daily. 16 g 6  . metFORMIN (GLUCOPHAGE) 500 MG tablet TAKE 1 TABLET BY MOUTH TWICE DAILY WITH MEALS 60 tablet 0  . pravastatin (PRAVACHOL) 20 MG tablet Take 1 tablet (20 mg total) by mouth daily. 90 tablet 3  . tamsulosin (FLOMAX) 0.4 MG CAPS capsule Take 1 capsule (0.4 mg total) by mouth daily. 90 capsule 1  . VENTOLIN HFA 108 (90 Base) MCG/ACT inhaler INHALE 1-2 PUFFS INTO LUNGS AS NEEDED 18 g 3  . warfarin (COUMADIN) 5 MG tablet Take as directed by [facility] 135 tablet 1   No facility-administered medications prior to visit.     Allergies:  Allergies  Allergen Reactions  . Penicillins         ASSESSMENT AND PLAN:  71 y.o. year old male with  1. Encounter to discuss test results - Ambulatory referral to Pulmonology - Ambulatory referral to Hematology / Oncology Both of these referrals have been ordered and placed as urgent.   Signed, [address] [place], [place], [facility] 05/21/2017 7:35 AM

## 2017-05-25 ENCOUNTER — Telehealth: Payer: Self-pay | Admitting: *Deleted

## 2017-05-25 ENCOUNTER — Encounter: Payer: Self-pay | Admitting: *Deleted

## 2017-05-25 DIAGNOSIS — R918 Other nonspecific abnormal finding of lung field: Secondary | ICD-10-CM

## 2017-05-25 NOTE — Telephone Encounter (Signed)
Oncology Nurse Navigator Documentation  Oncology Nurse Navigator Flowsheets 05/25/2017  Navigator Location [facility]  Referral date to RadOnc/MedOnc 05/25/2017  Navigator Encounter Type Telephone/I received referral on Ms. Gedeon today.  I called and scheduled him to be seen on 10/31 with Mikey Bussing NP when Dr. Julien Nordmann is here in the office.   Telephone Outgoing Call  Treatment Phase Abnormal Scans  Barriers/Navigation Needs Coordination of Care  Interventions Coordination of Care  Coordination of Care Appts  Acuity Level 1  Acuity Level 1 Initial guidance, education and coordination as needed  Time Spent with Patient 30

## 2017-05-28 ENCOUNTER — Other Ambulatory Visit: Payer: Self-pay | Admitting: Cardiology

## 2017-06-06 ENCOUNTER — Telehealth: Payer: Self-pay | Admitting: Oncology

## 2017-06-06 ENCOUNTER — Encounter: Payer: Self-pay | Admitting: Radiation Oncology

## 2017-06-06 ENCOUNTER — Other Ambulatory Visit (HOSPITAL_BASED_OUTPATIENT_CLINIC_OR_DEPARTMENT_OTHER): Payer: Medicare HMO

## 2017-06-06 ENCOUNTER — Encounter: Payer: Self-pay | Admitting: Oncology

## 2017-06-06 ENCOUNTER — Ambulatory Visit (HOSPITAL_BASED_OUTPATIENT_CLINIC_OR_DEPARTMENT_OTHER): Payer: Medicare HMO | Admitting: Oncology

## 2017-06-06 VITALS — BP 124/72 | HR 59 | Temp 97.8°F | Resp 18 | Ht 68.0 in | Wt 243.0 lb

## 2017-06-06 DIAGNOSIS — J90 Pleural effusion, not elsewhere classified: Secondary | ICD-10-CM

## 2017-06-06 DIAGNOSIS — Z86718 Personal history of other venous thrombosis and embolism: Secondary | ICD-10-CM | POA: Diagnosis not present

## 2017-06-06 DIAGNOSIS — R918 Other nonspecific abnormal finding of lung field: Secondary | ICD-10-CM

## 2017-06-06 DIAGNOSIS — Z7901 Long term (current) use of anticoagulants: Secondary | ICD-10-CM | POA: Diagnosis not present

## 2017-06-06 LAB — CBC WITH DIFFERENTIAL/PLATELET
BASO%: 0.5 % (ref 0.0–2.0)
Basophils Absolute: 0.1 10*3/uL (ref 0.0–0.1)
EOS ABS: 0.1 10*3/uL (ref 0.0–0.5)
EOS%: 0.9 % (ref 0.0–7.0)
HCT: 36.9 % — ABNORMAL LOW (ref 38.4–49.9)
HEMOGLOBIN: 12 g/dL — AB (ref 13.0–17.1)
LYMPH%: 11.6 % — ABNORMAL LOW (ref 14.0–49.0)
MCH: 28.5 pg (ref 27.2–33.4)
MCHC: 32.6 g/dL (ref 32.0–36.0)
MCV: 87.4 fL (ref 79.3–98.0)
MONO#: 1 10*3/uL — ABNORMAL HIGH (ref 0.1–0.9)
MONO%: 7.6 % (ref 0.0–14.0)
NEUT%: 79.4 % — ABNORMAL HIGH (ref 39.0–75.0)
NEUTROS ABS: 10.2 10*3/uL — AB (ref 1.5–6.5)
Platelets: 417 10*3/uL — ABNORMAL HIGH (ref 140–400)
RBC: 4.22 10*6/uL (ref 4.20–5.82)
RDW: 14.1 % (ref 11.0–14.6)
WBC: 12.9 10*3/uL — AB (ref 4.0–10.3)
lymph#: 1.5 10*3/uL (ref 0.9–3.3)

## 2017-06-06 LAB — COMPREHENSIVE METABOLIC PANEL
ALBUMIN: 2.6 g/dL — AB (ref 3.5–5.0)
ALK PHOS: 76 U/L (ref 40–150)
ALT: 42 U/L (ref 0–55)
AST: 32 U/L (ref 5–34)
Anion Gap: 9 mEq/L (ref 3–11)
BILIRUBIN TOTAL: 0.64 mg/dL (ref 0.20–1.20)
BUN: 12.7 mg/dL (ref 7.0–26.0)
CO2: 26 mEq/L (ref 22–29)
CREATININE: 1 mg/dL (ref 0.7–1.3)
Calcium: 9.3 mg/dL (ref 8.4–10.4)
Chloride: 101 mEq/L (ref 98–109)
GLUCOSE: 129 mg/dL (ref 70–140)
Potassium: 3.7 mEq/L (ref 3.5–5.1)
SODIUM: 136 meq/L (ref 136–145)
TOTAL PROTEIN: 7.3 g/dL (ref 6.4–8.3)

## 2017-06-06 NOTE — Progress Notes (Signed)
[facility] Initial Visit:  Patient Care Team: Rennis Golden as PCP - General (Physician Assistant)  REASON FOR CONSULTATION: 71 year old white male with a left lower lobe lung mass and left pleural effusion.   No history exists.    HPI: William Stanley 71 y.o. male has a past medical history significant for irritable bowel syndrome, atrial fibrillation, warfarin anticoagulation, hypertension, hyperlipidemia, history of elevated PSA followed by urology, diabetes, and history of tobacco use quit in 2016. The patient went to his primary care provider in September 2018 with cough and hemoptysis along with a feeling of fullness in his left chest and upper abdomen. Imaging showed probable pneumonia and he was treated with a course of Levaquin. Follow-up chest was obtained on 05/15/2017 which showed a persistent left lower lobe airspace opacity and follow-up was recommended with a CT scan. A CT scan of the chest with contrast was performed on 05/17/2017 which showed a mass in the left lower lobe which was obstructing the left lower lobe bronchus and measured 5.7 x 4.9 cm. Patient was also noted to have an enlarged subcarinal node measuring 11 mm and a left infrahilar lymph node measuring 1.8 cm. The patient also had a left pleural effusion noted on CT scan. The patient was referred to [name] for further evaluation and recommendations for his lung mass.  When seen today, the patient reports an ongoing cough that has been present for approximately 2 months. He does have hemoptysis with this. The patient has noted a weight loss of about 10-15 pounds over the past year, the patient thinks this is due to the fact that he is eating differently and taking metformin for his diabetes. The patient denies fevers and chills. He reports chest tightness in the center of his chest. He has no shortness of breath at rest, but does have dyspnea on exertion. Denies nausea, vomiting, constipation,  diarrhea. Family history is significant for his mother having breast cancer in a paternal grandmother who had stomach cancer. There was cardiovascular disease in both parents. The patient is married, but lives separately from his wife. He has 3 adult children, 1 daughter and 2 sons, who all live in [place]. Locally, he has a brother and sister. 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 The patient quit smoking approximately 2 years ago but smoked one half pack per day on and off for approximately 40 years. Denies alcohol was drug use. Patient presented to the clinic by himself today.  Review of Systems  Constitutional: Negative.   HENT:  Negative.   Eyes: Negative.   Respiratory: Positive for chest tightness, cough and hemoptysis.        Dyspnea on exertion.  Cardiovascular: Negative.   Gastrointestinal: Negative.   Genitourinary: Negative.    Musculoskeletal: Negative.   Skin: Negative.   Neurological: Negative.   Hematological: Negative.   Psychiatric/Behavioral: Negative.     MEDICAL HISTORY: Past Medical History:  Diagnosis Date  . Atrial fibrillation ([place])   . DVT (deep venous thrombosis) ([place])   . Edema of lower extremity   . Hypertension   . Irritable bowel syndrome (IBS)   . Lung collapse    LEFT LUNG, TWICE  . Pulmonary embolism ([place])     SURGICAL HISTORY: Past Surgical History:  Procedure Laterality Date  . EYE SURGERY    . HERNIA REPAIR  1992  . [place]  SOCIAL HISTORY: Social History   Social History  . Marital status: Married    Spouse name: N/A  . Number of children: N/A  . Years of education: N/A   Occupational History  . Not on file.   Social History Main Topics  . Smoking status: Former Smoker    Packs/day: 0.25    Types: Cigarettes    Quit date: 07/07/2012  . Smokeless tobacco: Never Used     Comment: pk a week  . Alcohol use No  . Drug use: No  . Sexual activity: Not on file    Other Topics Concern  . Not on file   Social History Narrative  . No narrative on file    FAMILY HISTORY Family History  Problem Relation Age of Onset  . Heart attack Mother   . Heart failure Mother   . Arrhythmia Father   . Heart failure Father     ALLERGIES:  is allergic to penicillins.  MEDICATIONS:  Current Outpatient Prescriptions  Medication Sig Dispense Refill  . ACCU-CHEK AVIVA PLUS test strip CHECK BLOOD SUGAR THREE TIMES DAILY 250 each 0  . ACCU-CHEK SOFTCLIX LANCETS lancets CHECK BLOOD SUGAR THREE TIMES DAILY 200 each 0  . Alcohol Swabs (ALCOHOL PADS) 70 % PADS Use to check blood sugar daily 100 each 11  . amLODipine (NORVASC) 5 MG tablet TAKE 1 TABLET BY MOUTH EVERY DAY 90 tablet 1  . benazepril (LOTENSIN) 10 MG tablet TAKE 1 TABLET BY MOUTH EVERY DAY 90 tablet 1  . blood glucose meter kit and supplies KIT Dispense based on patient and insurance preference. Check blood sugar three times daily  (FOR ICD-10E11.5). 1 each 0  . cetirizine (ZYRTEC) 10 MG tablet Take 1 tablet (10 mg total) by mouth daily. 30 tablet 11  . metFORMIN (GLUCOPHAGE) 500 MG tablet TAKE 1 TABLET BY MOUTH TWICE DAILY WITH MEALS 60 tablet 0  . pravastatin (PRAVACHOL) 20 MG tablet Take 1 tablet (20 mg total) by mouth daily. 90 tablet 3  . tamsulosin (FLOMAX) 0.4 MG CAPS capsule Take 1 capsule (0.4 mg total) by mouth daily. 90 capsule 1  . VENTOLIN HFA 108 (90 Base) MCG/ACT inhaler INHALE 1-2 PUFFS INTO LUNGS AS NEEDED 18 g 3  . warfarin (COUMADIN) 5 MG tablet Take 1 to 1 and 1/2 tablets daily as  directed by [facility]. 135 tablet 0  . albuterol (VENTOLIN HFA) 108 (90 Base) MCG/ACT inhaler INHALE 1-2 PUFFS INTO LUNGS Every 4 hours AS NEEDED 18 g 1  . fluticasone (FLONASE) 50 MCG/ACT nasal spray Place 2 sprays into both nostrils daily. (Patient not taking: Reported on 06/06/2017) 16 g 6   No current facility-administered medications for this visit.     PHYSICAL EXAMINATION:  ECOG  PERFORMANCE STATUS: 1 - Symptomatic but completely ambulatory   Vitals:   06/06/17 0846  BP: 124/72  Pulse: (!) 59  Resp: 18  Temp: 97.8 F (36.6 C)  SpO2: 97%    Filed Weights   06/06/17 0846  Weight: 243 lb (110.2 kg)     Physical Exam  Constitutional: He is oriented to person, place, and time and well-developed, well-nourished, and in no distress. No distress.  HENT:  Head: Normocephalic and atraumatic.  Mouth/Throat: Oropharynx is clear and moist. No oropharyngeal exudate.  Eyes: Pupils are equal, round, and reactive to light. Conjunctivae and EOM are normal. Right eye exhibits no discharge. Left eye exhibits no discharge. No scleral icterus.  Neck: Normal range of motion. Neck supple.  No JVD present. No tracheal deviation present.  Cardiovascular: Normal rate, regular rhythm, normal heart sounds and intact distal pulses.   Pulmonary/Chest: Effort normal and breath sounds normal. No stridor. No respiratory distress. He has no wheezes. He has no rales.  Abdominal: Soft. Bowel sounds are normal. He exhibits no distension and no mass. There is no tenderness.  Musculoskeletal: Normal range of motion. He exhibits no edema.  Lymphadenopathy:    He has no cervical adenopathy.  Neurological: He is alert and oriented to person, place, and time. No cranial nerve deficit. He exhibits normal muscle tone. Gait normal. Coordination normal.  Skin: Skin is warm and dry. No rash noted. He is not diaphoretic. No erythema. No pallor.  Psychiatric: Mood, memory, affect and judgment normal.  Vitals reviewed.    LABORATORY DATA: I have personally reviewed the data as listed:  Appointment on 06/06/2017  Component Date Value Ref Range Status  . WBC 06/06/2017 12.9* 4.0 - 10.3 10e3/uL Final  . NEUT# 06/06/2017 10.2* 1.5 - 6.5 10e3/uL Final  . HGB 06/06/2017 12.0* 13.0 - 17.1 g/dL Final  . HCT 03/47/4512 36.9* 38.4 - 49.9 % Final  . Platelets 06/06/2017 417* 140 - 400 10e3/uL Final  . MCV  06/06/2017 87.4  79.3 - 98.0 fL Final  . MCH 06/06/2017 28.5  27.2 - 33.4 pg Final  . MCHC 06/06/2017 32.6  32.0 - 36.0 g/dL Final  . RBC 19/21/7420 4.22  4.20 - 5.82 10e6/uL Final  . RDW 06/06/2017 14.1  11.0 - 14.6 % Final  . lymph# 06/06/2017 1.5  0.9 - 3.3 10e3/uL Final  . MONO# 06/06/2017 1.0* 0.1 - 0.9 10e3/uL Final  . Eosinophils Absolute 06/06/2017 0.1  0.0 - 0.5 10e3/uL Final  . Basophils Absolute 06/06/2017 0.1  0.0 - 0.1 10e3/uL Final  . NEUT% 06/06/2017 79.4* 39.0 - 75.0 % Final  . LYMPH% 06/06/2017 11.6* 14.0 - 49.0 % Final  . MONO% 06/06/2017 7.6  0.0 - 14.0 % Final  . EOS% 06/06/2017 0.9  0.0 - 7.0 % Final  . BASO% 06/06/2017 0.5  0.0 - 2.0 % Final  . Sodium 06/06/2017 136  136 - 145 mEq/L Final  . Potassium 06/06/2017 3.7  3.5 - 5.1 mEq/L Final  . Chloride 06/06/2017 101  98 - 109 mEq/L Final  . CO2 06/06/2017 26  22 - 29 mEq/L Final  . Glucose 06/06/2017 129  70 - 140 mg/dl Final   Glucose reference range is for nonfasting patients. Fasting glucose reference range is 70- 100.  Marland Kitchen BUN 06/06/2017 12.7  7.0 - 26.0 mg/dL Final  . Creatinine 66/15/1110 1.0  0.7 - 1.3 mg/dL Final  . Total Bilirubin 06/06/2017 0.64  0.20 - 1.20 mg/dL Final  . Alkaline Phosphatase 06/06/2017 76  40 - 150 U/L Final  . AST 06/06/2017 32  5 - 34 U/L Final  . ALT 06/06/2017 42  0 - 55 U/L Final  . Total Protein 06/06/2017 7.3  6.4 - 8.3 g/dL Final  . Albumin 45/06/474 2.6* 3.5 - 5.0 g/dL Final  . Calcium 74/71/3932 9.3  8.4 - 10.4 mg/dL Final  . Anion Gap 12/03/9355 9  3 - 11 mEq/L Final  . EGFR 06/06/2017 >60  >60 ml/min/1.73 m2 Final   eGFR is calculated using the CKD-EPI Creatinine Equation (2009)  Anti-coag visit on 05/15/2017  Component Date Value Ref Range Status  . INR 05/15/2017 2.4   Final    RADIOGRAPHIC STUDIES: I have personally reviewed the radiological images as  listed and agree with the findings in the report  Dg Chest 2 View  Result Date: 05/15/2017 CLINICAL DATA:  Left  lower lobe pneumonia. EXAM: CHEST  2 VIEW COMPARISON:  Two-view chest x-ray 04/20/2017 FINDINGS: The heart is enlarged. Left pleural effusion and retrocardiac opacity remain. There slight decrease in the effusion. Mild pulmonary vascular congestion is present. Mild right basilar atelectasis is noted. IMPRESSION: 1. Persistent left lower lobe airspace opacity. Given the prolonged timeframe, CT of the chest with contrast is recommended for further evaluation of possible proximal obstructing lesion. 2. Slight decrease in left pleural effusion. 3. Minimal right basilar atelectasis. Electronically Signed   By: San Morelle M.D.   On: 05/15/2017 13:10   Ct Chest W Contrast  Result Date: 05/17/2017 CLINICAL DATA:  Followup left lower lobe pneumonia. EXAM: CT CHEST WITH CONTRAST TECHNIQUE: Multidetector CT imaging of the chest was performed during intravenous contrast administration. CONTRAST:  87m ISOVUE-300 IOPAMIDOL (ISOVUE-300) INJECTION 61% COMPARISON:  None FINDINGS: Cardiovascular: The heart size is mildly enlarged. There is aortic atherosclerosis. Calcification in the RCA, LAD, left main coronary artery is noted. No pericardial effusion. Mediastinum/Nodes: The trachea appears patent and is midline. Normal appearance of the esophagus. No axillary or supraclavicular adenopathy. Sub- carinal node measures 11 mm, image 69 of series 2. Left infrahilar lymph node is enlarged measuring 1.8 cm, image 118 of series 5. Lungs/Pleura: Small to moderate left pleural effusion identified. There is a mass within the left lower lobe obstructing the left lower lobe bronchus measuring 5.7 x4.9 cm. Postobstructive atelectasis and consolidation of the left lower lobe noted. Upper Abdomen: Hepatic steatosis identified. No acute findings identified within the upper abdomen. Musculoskeletal: There is degenerative disc disease identified within the lower thoracic spine. No aggressive lytic or sclerotic bone lesions.  IMPRESSION: 1. Left lower lobe lung mass with associated postobstructive pneumonitis is identified and highly worrisome for primary bronchogenic carcinoma. Further investigation with PET-CT and tissue sampling is advised. 2. Enlarged left hilar and sub- carinal lymph nodes. 3. Left pleural effusion. 4. Aortic Atherosclerosis (ICD10-I70.0). Coronary artery calcifications are identified involving the RCA, LAD and left main coronary arteries. Electronically Signed   By: TKerby MoorsM.D.   On: 05/17/2017 13:33    ASSESSMENT/PLAN    Lung mass This is a pleasant [age]year old white male with a left lower lobe lung mass and left pleural effusion diagnosed in October 2018. The patient presented with questionable pneumonia and hemoptysis.  The patient was seen with Dr. MJulien Nordmannwho had a lengthy discussion with the patient regarding CT scan results and need for further workup. Images were shown to the patient. Explained to the patient that this lung nodule is concerning for lung cancer. We do need to complete staging by ordering a PET scan and MRI of the brain to be done within one week. We have also ordered an ultrasound-guided thoracentesis of the left pleural effusion. We will try to get a biopsy off with pleural fluid to confirm the diagnosis. If we are unable to confirm the diagnosis, we will consider referral for a bronchoscopy. Since the patient is having hemoptysis, we will make referral to radiation oncology for consideration of radiation to his left lower lobe lung mass.  We will plan to bring the patient back for follow-up to discuss his results and determine further plan for treatment in 2 weeks.  All questions were answered. The patient knows to call the clinic with any problems, questions or concerns.     Orders Placed  This Encounter  Procedures  . MR Brain W Wo Contrast    Standing Status:   Future    Standing Expiration Date:   06/06/2018    Order Specific Question:   If indicated for  the ordered procedure, I authorize the administration of contrast media per Radiology protocol    Answer:   Yes    Order Specific Question:   What is the patient's sedation requirement?    Answer:   No Sedation    Order Specific Question:   Does the patient have a pacemaker or implanted devices?    Answer:   No    Order Specific Question:   Radiology Contrast Protocol - do NOT remove file path    Answer:   \\[email or web address]    Order Specific Question:   Reason for Exam additional comments    Answer:   Lung mass. Eval for brain mets.    Order Specific Question:   Preferred imaging location?    Answer:   [facility] (table limit-350 lbs)  . NM PET Image Initial (PI) Skull Base To Thigh    Standing Status:   Future    Standing Expiration Date:   06/06/2018    Order Specific Question:   If indicated for the ordered procedure, I authorize the administration of a radiopharmaceutical per Radiology protocol    Answer:   Yes    Order Specific Question:   Preferred imaging location?    Answer:   [facility]    Order Specific Question:   Radiology Contrast Protocol - do NOT remove file path    Answer:   \\[email or web address]    Order Specific Question:   Reason for Exam additional comments    Answer:   Lung mass. Eval for mets.  . US THORACENTESIS ASP PLEURAL SPACE W/IMG GUIDE    Standing Status:   Future    Standing Expiration Date:   08/06/2018    Scheduling Instructions:     Send fluid for cytology    Order Specific Question:   Reason for exam:    Answer:   Lung mass with pleural effusion.    Order Specific Question:   Preferred imaging location?    Answer:   [facility]  . Ambulatory referral to Radiation Oncology    Referral Priority:   Routine    Referral Type:   Consultation    Referral Reason:   Specialty Services Required    Requested Specialty:   Radiation Oncology    Number of Visits Requested:   1      Mikey Bussing, NP  06/06/2017 12:20 PM   ADDENDUM: Hematology/Oncology Attending: I had a face-to-face encounter with the patient today and I recommended his care plan.  He is a very pleasant 71 years old white male who has been complaining of cough and shortness of breath as well as blood-tinged sputum since September 2018.  He was seen by his primary care physician and treated for questionable pneumonia.  Repeat chest x-ray on May 15, 2017 showed persistent left lower lobe airspace opacity.  This was followed by CT scan of the chest with contrast on May 17, 2017 and that showed left lower lobe lung mass with associated postobstructive pneumonitis highly worrisome for primary bronchogenic carcinoma.  There was also enlarged left hilar and subcarinal lymph nodes as well as left pleural effusion.  The mass measured 5.7 x 4.9 cm. The patient was referred to me today for evaluation and recommendation regarding his  condition. I personally and independently reviewed the scan images and discussed the results and showed the images to the patient today. I recommended for the patient to complete the staging workup by ordering a PET scan as well as MRI of the brain to rule out any other metastatic disease. I will also arrange for the patient to have ultrasound-guided left thoracentesis for diagnostic and therapeutic purposes.  If the cytology from the pleural fluid is not diagnostic, I would refer the patient to cardiothoracic surgery for consideration of bronchoscopy with endobronchial ultrasound and biopsy. I also referred the patient to radiation oncology for evaluation and consideration of palliative radiotherapy to the enlarging left lower lobe lung mass because of the persistent hemoptysis. I would see the patient back for follow-up visit in 2 weeks for reevaluation and discussion of his treatment options based on the staging workup and tissue diagnosis. He was advised to call immediately if  he has any concerning symptoms in the interval.  Disclaimer: This note was dictated with voice recognition software. Similar sounding words can inadvertently be transcribed and may be missed upon review. Eilleen Kempf, MD 06/06/17

## 2017-06-06 NOTE — Assessment & Plan Note (Addendum)
This is a pleasant 71 year old white male with a left lower lobe lung mass and left pleural effusion diagnosed in October 2018. The patient presented with questionable pneumonia and hemoptysis.  The patient was seen with Dr. Julien Nordmann who had a lengthy discussion with the patient regarding CT scan results and need for further workup. Images were shown to the patient. Explained to the patient that this lung nodule is concerning for lung cancer. We do need to complete staging by ordering a PET scan and MRI of the brain to be done within one week. We have also ordered an ultrasound-guided thoracentesis of the left pleural effusion. We will try to get a biopsy off with pleural fluid to confirm the diagnosis. If we are unable to confirm the diagnosis, we will consider referral for a bronchoscopy. Since the patient is having hemoptysis, we will make referral to radiation oncology for consideration of radiation to his left lower lobe lung mass.  We will plan to bring the patient back for follow-up to discuss his results and determine further plan for treatment in 2 weeks.  All questions were answered. The patient knows to call the clinic with any problems, questions or concerns.

## 2017-06-06 NOTE — Telephone Encounter (Signed)
Scheduled appt per 10/31 los - referral for rad onc scheduled by [facility] radiology to contact patient with scans and Thoracentisis schedule.

## 2017-06-07 NOTE — Progress Notes (Signed)
Thoracic Location of Tumor / Histology: left lower lobe lung mass   Patient presented in September 2018 with cough and hemoptysis along with a feeling of fullness in his left chest and upper abdomen.   Biopsies revealed: Thoracentesis scheduled for 06/18/17  Tobacco/Marijuana/Snuff/ETOH use: former smoker, quit in 2013, smoked a 1/2 ppd on and off for 40 years, denies ETOH, Marijuana or snuff use.  Past/Anticipated interventions by cardiothoracic surgery, if any: possible referral for bronchoscopy if diagnosis not obtained with thoracentesis.  Past/Anticipated interventions by medical oncology, if any: wait to see pathology  Signs/Symptoms  Weight changes, if any: yes has lost about 30 lbs.  Respiratory complaints, if any: has shortness of breath, has coughing "spells."  Hemoptysis, if any: no  Pain issues, if any:  no  SAFETY ISSUES:  Prior radiation? no  Pacemaker/ICD? no   Possible current pregnancy?no  Is the patient on methotrexate? no  Current Complaints / other details:  PET scan, MRI brain ordered but not scheduled.  He reports he is not able to sleep.  BP 124/66 (BP Location: Left Arm, Patient Position: Sitting)   Pulse 73   Temp 97.7 F (36.5 C)   Ht 5\' 8"  (1.727 m)   Wt 232 lb (105.2 kg)   SpO2 95%   BMI 35.28 kg/m    Wt Readings from Last 3 Encounters:  06/13/17 232 lb (105.2 kg)  06/12/17 233 lb (105.7 kg)  06/06/17 243 lb (110.2 kg)

## 2017-06-08 ENCOUNTER — Institutional Professional Consult (permissible substitution): Payer: Medicare HMO | Admitting: Pulmonary Disease

## 2017-06-12 ENCOUNTER — Ambulatory Visit: Payer: Medicare HMO | Admitting: Pulmonary Disease

## 2017-06-12 ENCOUNTER — Encounter: Payer: Self-pay | Admitting: Pulmonary Disease

## 2017-06-12 VITALS — BP 130/70 | HR 86 | Ht 68.0 in | Wt 233.0 lb

## 2017-06-12 DIAGNOSIS — R918 Other nonspecific abnormal finding of lung field: Secondary | ICD-10-CM | POA: Diagnosis not present

## 2017-06-12 DIAGNOSIS — J90 Pleural effusion, not elsewhere classified: Secondary | ICD-10-CM

## 2017-06-12 NOTE — Progress Notes (Signed)
Subjective:    Patient ID: William Stanley, male    DOB: July 19, 1946, 71 y.o.   MRN: [id number]  Synopsis: Referred in 2018 for left lower lobe mass  HPI Chief Complaint  Patient presents with  . Advice Only    Referred by Dr. Doren Custard to follow up on CT chest.     Dionel says that he had a collapsed lung when he was young.    He says that he noticed the sensation that his left side was swollen, this developed somewhat insidiously.  He also developed a cough with hemoptysis.  He takes a blood thinner for Afib.  He was sent to [place] and he had a CT scan that showed a left lung mass.  He was told to go to oncology and he saw them last week.  It sounds as if they plan to get a thoracentesis.  He says that he was in [facility] for many weeks for asthma as a child.  He says this occurred repeatedly for months when he was a child.  He says that his dyspnea was related to the environment.  He says that he had pneumonia when he was [age] days old as well.    He wheezes some now and uses ventolin from time tot time.  Hot weahter makes him more dyspneic.  Cold weather will make it worse too.    He smoked cigarettes, the most was 1 pack per day.  He quit 2013.  He started around age 67.    Past Medical History:  Diagnosis Date  . Atrial fibrillation ([place])   . DVT (deep venous thrombosis) ([place])   . Edema of lower extremity   . Hypertension   . Irritable bowel syndrome (IBS)   . Lung collapse    LEFT LUNG, TWICE  . Pulmonary embolism ([facility])      Family History  Problem Relation Age of Onset  . Heart attack Mother   . Heart failure Mother   . Arrhythmia Father   . Heart failure Father      Social History   Socioeconomic History  . Marital status: Married    Spouse name: Not on file  . Number of children: Not on file  . Years of education: Not on file  . Highest education level: Not on file  Social Needs  . Financial resource strain: Not on file  . Food insecurity -  worry: Not on file  . Food insecurity - inability: Not on file  . Transportation needs - medical: Not on file  . Transportation needs - non-medical: Not on file  Occupational History  . Not on file  Tobacco Use  . Smoking status: Former Smoker    Packs/day: 0.25    Years: 15.00    Pack years: 3.75    Types: Cigarettes    Last attempt to quit: 07/07/2012    Years since quitting: 4.9  . Smokeless tobacco: Never Used  Substance and Sexual Activity  . Alcohol use: No    Alcohol/week: 0.0 oz  . Drug use: No  . Sexual activity: Not on file  Other Topics Concern  . Not on file  Social History Narrative  . Not on file     Allergies  Allergen Reactions  . Penicillins      Outpatient Medications Prior to Visit  Medication Sig Dispense Refill  . ACCU-CHEK AVIVA PLUS test strip CHECK BLOOD SUGAR THREE TIMES DAILY 250 each 0  . ACCU-CHEK SOFTCLIX LANCETS  lancets CHECK BLOOD SUGAR THREE TIMES DAILY 200 each 0  . albuterol (VENTOLIN HFA) 108 (90 Base) MCG/ACT inhaler INHALE 1-2 PUFFS INTO LUNGS Every 4 hours AS NEEDED 18 g 1  . Alcohol Swabs (ALCOHOL PADS) 70 % PADS Use to check blood sugar daily 100 each 11  . amLODipine (NORVASC) 5 MG tablet TAKE 1 TABLET BY MOUTH EVERY DAY 90 tablet 1  . benazepril (LOTENSIN) 10 MG tablet TAKE 1 TABLET BY MOUTH EVERY DAY 90 tablet 1  . blood glucose meter kit and supplies KIT Dispense based on patient and insurance preference. Check blood sugar three times daily  (FOR ICD-10E11.5). 1 each 0  . cetirizine (ZYRTEC) 10 MG tablet Take 1 tablet (10 mg total) by mouth daily. 30 tablet 11  . fluticasone (FLONASE) 50 MCG/ACT nasal spray Place 2 sprays into both nostrils daily. 16 g 6  . metFORMIN (GLUCOPHAGE) 500 MG tablet TAKE 1 TABLET BY MOUTH TWICE DAILY WITH MEALS 60 tablet 0  . pravastatin (PRAVACHOL) 20 MG tablet Take 1 tablet (20 mg total) by mouth daily. 90 tablet 3  . tamsulosin (FLOMAX) 0.4 MG CAPS capsule Take 1 capsule (0.4 mg total) by mouth  daily. 90 capsule 1  . VENTOLIN HFA 108 (90 Base) MCG/ACT inhaler INHALE 1-2 PUFFS INTO LUNGS AS NEEDED 18 g 3  . warfarin (COUMADIN) 5 MG tablet Take 1 to 1 and 1/2 tablets daily as  directed by [facility]. 135 tablet 0   No facility-administered medications prior to visit.       Review of Systems  Constitutional: Negative for fever and unexpected weight change.  HENT: Negative for congestion, dental problem, ear pain, nosebleeds, postnasal drip, rhinorrhea, sinus pressure, sneezing, sore throat and trouble swallowing.   Eyes: Negative for redness and itching.  Respiratory: Positive for cough and shortness of breath. Negative for chest tightness and wheezing.   Cardiovascular: Negative for palpitations and leg swelling.  Gastrointestinal: Negative for nausea and vomiting.  Genitourinary: Negative for dysuria.  Musculoskeletal: Negative for joint swelling.  Skin: Negative for rash.  Neurological: Positive for light-headedness and headaches.  Hematological: Does not bruise/bleed easily.  Psychiatric/Behavioral: Negative for dysphoric mood. The patient is not nervous/anxious.        Objective:   Physical Exam  Vitals:   06/12/17 1027  BP: 130/70  Pulse: 86  SpO2: 92%  Weight: 233 lb (105.7 kg)  Height: 5' 8" (1.727 m)   Gen: chronically ill appearing, no acute distress HENT: NCAT, OP clear, neck supple without masses Eyes: PERRL, EOMi Lymph: no cervical lymphadenopathy PULM: CTA B CV: Irreg irreg, no mgr, no JVD GI: BS+, soft, nontender, no hsm Derm: no rash or skin breakdown MSK: normal bulk and tone Neuro: A&Ox4, CN II-XII intact, strength 5/5 in all 4 extremities Psyche: normal mood and affect   CBC    Component Value Date/Time   WBC 12.9 (H) 06/06/2017 0759   WBC 10.9 (H) 04/19/2017 1218   RBC 4.22 06/06/2017 0759   RBC 4.29 04/19/2017 1218   HGB 12.0 (L) 06/06/2017 0759   HCT 36.9 (L) 06/06/2017 0759   PLT 417 (H) 06/06/2017 0759   MCV 87.4  06/06/2017 0759   MCH 28.5 06/06/2017 0759   MCH 31.0 04/19/2017 1218   MCHC 32.6 06/06/2017 0759   MCHC 33.3 04/19/2017 1218   RDW 14.1 06/06/2017 0759   LYMPHSABS 1.5 06/06/2017 0759   MONOABS 1.0 (H) 06/06/2017 0759   EOSABS 0.1 06/06/2017 0759   BASOSABS 0.1  06/06/2017 0759   Chest imaging: 05/17/2017 CT chest images independently reviewed: large left lower lobe mass, left hilar adenopathy noted, 33m subcarinal lymph node  I reviewed the records from oncology from last week, they have made plans for thoracentesis, radiation oncology evaluation and a PET/CT.     Assessment & Plan:   Lung mass - Plan: IR THORACENTESIS ASP PLEURAL SPACE W/IMG GUIDE  Pleural effusion on left  Discussion: Mr. WShan Levanshas a large left lower lung mass with a pleural effusion.  Today he and I went over these images together.  Given his smoking history this is very worrisome for lung cancer.  The least invasive test that would give u[place] highest potential stage is a thoracentesis, so I have asked him to stop taking warfarin today and will make arrangements for a thoracentesis by the end of the week.  If the cytology from that test is unrevealing then I will perform a bronchoscopy next week to try to get an answer.  He will continue follow-up with radiation oncology and will have a PET scan this week.  Plan: For the lung mass with pleural effusion: I am concerned that this is lung cancer We will sample the fluid from around your left lung, if that does not give u[place] answer then I will perform a bronchoscopy which is where I shine a lighted camera down in your lungs to take a biopsy of the mass. Stop taking warfarin today so that we can perform these procedures [facility] If that test does not help [place] I will arrange for a bronchoscopy next week  We will see you back in 2 weeks or sooner if  needed    Current Outpatient Medications:  .  ACCU-CHEK AVIVA PLUS test strip, CHECK BLOOD SUGAR THREE TIMES DAILY, Disp: 250 each, Rfl: 0 .  ACCU-CHEK SOFTCLIX LANCETS lancets, CHECK BLOOD SUGAR THREE TIMES DAILY, Disp: 200 each, Rfl: 0 .  albuterol (VENTOLIN HFA) 108 (90 Base) MCG/ACT inhaler, INHALE 1-2 PUFFS INTO LUNGS Every 4 hours AS NEEDED, Disp: 18 g, Rfl: 1 .  Alcohol Swabs (ALCOHOL PADS) 70 % PADS, Use to check blood sugar daily, Disp: 100 each, Rfl: 11 .  amLODipine (NORVASC) 5 MG tablet, TAKE 1 TABLET BY MOUTH EVERY DAY, Disp: 90 tablet, Rfl: 1 .  benazepril (LOTENSIN) 10 MG tablet, TAKE 1 TABLET BY MOUTH EVERY DAY, Disp: 90 tablet, Rfl: 1 .  blood glucose meter kit and supplies KIT, Dispense based on patient and insurance preference. Check blood sugar three times daily  (FOR ICD-10E11.5)., Disp: 1 each, Rfl: 0 .  cetirizine (ZYRTEC) 10 MG tablet, Take 1 tablet (10 mg total) by mouth daily., Disp: 30 tablet, Rfl: 11 .  fluticasone (FLONASE) 50 MCG/ACT nasal spray, Place 2 sprays into both nostrils daily., Disp: 16 g, Rfl: 6 .  metFORMIN (GLUCOPHAGE) 500 MG tablet, TAKE 1 TABLET BY MOUTH TWICE DAILY WITH MEALS, Disp: 60 tablet, Rfl: 0 .  pravastatin (PRAVACHOL) 20 MG tablet, Take 1 tablet (20 mg total) by mouth daily., Disp: 90 tablet, Rfl: 3 .  tamsulosin (FLOMAX) 0.4 MG CAPS capsule, Take 1 capsule (0.4 mg total) by mouth daily., Disp: 90 capsule, Rfl: 1 .  VENTOLIN HFA 108 (90 Base) MCG/ACT inhaler, INHALE 1-2 PUFFS INTO LUNGS AS NEEDED, Disp: 18 g, Rfl: 3 .  warfarin (COUMADIN) 5 MG tablet, Take 1 to 1 and 1/2  tablets daily as  directed by [facility]., Disp: 135 tablet, Rfl: 0

## 2017-06-12 NOTE — Patient Instructions (Signed)
For the lung mass with pleural effusion: I am concerned that this is lung cancer We will sample the fluid from around your left lung, if that does not give [name] an answer then I will perform a bronchoscopy which is where I shine a lighted camera down in your lungs to take a biopsy of the mass. Stop taking warfarin today so that we can perform these procedures We will make arrangements for sampling the fluid off of your lungs at [facility] this week If that test does not help [name] then I will arrange for a bronchoscopy next week  We will see you back in 2 weeks or sooner if needed

## 2017-06-13 ENCOUNTER — Encounter: Payer: Self-pay | Admitting: Radiation Oncology

## 2017-06-13 ENCOUNTER — Ambulatory Visit
Admission: RE | Admit: 2017-06-13 | Discharge: 2017-06-13 | Disposition: A | Discharge status: Home / Self Care | Payer: Medicare HMO | Source: Ambulatory Visit | Attending: Radiation Oncology | Admitting: Radiation Oncology

## 2017-06-13 DIAGNOSIS — R918 Other nonspecific abnormal finding of lung field: Secondary | ICD-10-CM

## 2017-06-13 DIAGNOSIS — C3432 Malignant neoplasm of lower lobe, left bronchus or lung: Secondary | ICD-10-CM | POA: Diagnosis not present

## 2017-06-13 DIAGNOSIS — Z510 Encounter for antineoplastic radiation therapy: Secondary | ICD-10-CM | POA: Insufficient documentation

## 2017-06-13 DIAGNOSIS — Z87891 Personal history of nicotine dependence: Secondary | ICD-10-CM | POA: Diagnosis not present

## 2017-06-13 HISTORY — DX: Type 2 diabetes mellitus without complications: E11.9

## 2017-06-13 NOTE — Progress Notes (Signed)
Radiation Oncology         [phone] ________________________________  Initial outpatient Consultation  Name: William Stanley MRN: [id number]  Date: 06/13/2017  DOB: 1946-06-26  IR:SWNIO, Lonie Peak, PA-C  Curt Bears, MD   REFERRING PHYSICIAN: Curt Bears, MD  DIAGNOSIS:  left lower lobe lung mass and left pleural effusion.  HISTORY OF PRESENT ILLNESS::William Stanley is a 71 y.o. male who presents today for further evaluation of left lower lobe lung mass. On 04/20/2017, patient received a CT of the abdomen and pelvis as well as a chest X-Ray due to hemoptysis and suspected pneumonia that did not resolve after antibiotics. The CT revealed a left-lower lung mass. On 05/17/2017, he had a CT of his chest which revealed a left lower lung mass that is highly worrisome for primary bronchogenic carcinoma. Enlarged left hilar and sub-carinal lymph nodes were noted as well. Also, a left pleural effusion was found. Coronary artery calcifications are also identified involving the RCA, LAD and main coronary arteries.   On review of systems, he reports a decrease in weight of about 30 pounds. He reports intermittent SOB and cough. He has difficulty sleeping at times and has had more frequent headaches. Pt denies pain, increased SOB, hemoptysis or difficulty eating. He is not on home oxygen therapy. He is a former smoker of 0.5 PPD quitting 5 years ago in 2013. He denies ETOH, smokeless tobacco or marijuana use. He reports past surgical h/o of right sided hernia repair and venous ligation surgery several years ago.    PREVIOUS RADIATION THERAPY: No  PAST MEDICAL HISTORY:  has a past medical history of Atrial fibrillation ([place]), Diabetes ([place]), DVT (deep venous thrombosis) ([place]), Edema of lower extremity, Hypertension, Irritable bowel syndrome (IBS), Lung collapse, and Pulmonary embolism ([place]).    PAST SURGICAL HISTORY: Past Surgical History:  Procedure Laterality Date  . EYE SURGERY    .  HERNIA REPAIR  1992  . VENOUS LIGATION SURGERY  1988    FAMILY HISTORY: family history includes Arrhythmia in his father; Heart attack in his mother; Heart failure in his father and mother.  SOCIAL HISTORY:  reports that he quit smoking about 4 years ago. His smoking use included cigarettes. He has a 3.75 pack-year smoking history. he has never used smokeless tobacco. He reports that he does not drink alcohol or use drugs.  ALLERGIES: Penicillins  MEDICATIONS:  Current Outpatient Medications  Medication Sig Dispense Refill  . ACCU-CHEK AVIVA PLUS test strip CHECK BLOOD SUGAR THREE TIMES DAILY 250 each 0  . ACCU-CHEK SOFTCLIX LANCETS lancets CHECK BLOOD SUGAR THREE TIMES DAILY 200 each 0  . albuterol (VENTOLIN HFA) 108 (90 Base) MCG/ACT inhaler INHALE 1-2 PUFFS INTO LUNGS Every 4 hours AS NEEDED 18 g 1  . Alcohol Swabs (ALCOHOL PADS) 70 % PADS Use to check blood sugar daily 100 each 11  . amLODipine (NORVASC) 5 MG tablet TAKE 1 TABLET BY MOUTH EVERY DAY 90 tablet 1  . benazepril (LOTENSIN) 10 MG tablet TAKE 1 TABLET BY MOUTH EVERY DAY 90 tablet 1  . blood glucose meter kit and supplies KIT Dispense based on patient and insurance preference. Check blood sugar three times daily  (FOR ICD-10E11.5). 1 each 0  . cetirizine (ZYRTEC) 10 MG tablet Take 1 tablet (10 mg total) by mouth daily. 30 tablet 11  . metFORMIN (GLUCOPHAGE) 500 MG tablet TAKE 1 TABLET BY MOUTH TWICE DAILY WITH MEALS 60 tablet 0  . pravastatin (PRAVACHOL) 20 MG tablet Take 1  tablet (20 mg total) by mouth daily. 90 tablet 3  . tamsulosin (FLOMAX) 0.4 MG CAPS capsule Take 1 capsule (0.4 mg total) by mouth daily. 90 capsule 1  . VENTOLIN HFA 108 (90 Base) MCG/ACT inhaler INHALE 1-2 PUFFS INTO LUNGS AS NEEDED 18 g 3  . warfarin (COUMADIN) 5 MG tablet Take 1 to 1 and 1/2 tablets daily as  directed by [facility]. 135 tablet 0  . fluticasone (FLONASE) 50 MCG/ACT nasal spray Place 2 sprays into both nostrils daily. (Patient not  taking: Reported on 06/13/2017) 16 g 6   No current facility-administered medications for this encounter.     REVIEW OF SYSTEMS:  A 10+ POINT REVIEW OF SYSTEMS WAS OBTAINED including neurology, dermatology, psychiatry, cardiac, respiratory, lymph, extremities, GI, GU, Musculoskeletal, constitutional, breasts, reproductive, HEENT.  All pertinent positives are noted in the HPI.  All others are negative.   PHYSICAL EXAM:  height is _0  (1.727 m) and weight is 232 lb (105.2 kg). His temperature is 97.7 F (36.5 C). His blood pressure is 124/66 and his pulse is 73. His oxygen saturation is 95%.   General: Alert and oriented, in no acute distress HEENT: Head is normocephalic. Extraocular movements are intact. Oropharynx is clear. Few teeth remaining along mandible. Several teeth missing with poor dentition.  Neck: Neck is supple, no palpable cervical or supraclavicular lymphadenopathy. Heart: Regular in rate and rhythm with no murmurs, rubs, or gallops. Chest: Clear to auscultation bilaterally, with no rhonchi, wheezes, or rales. Abdomen: Soft, nontender, nondistended, with no rigidity or guarding. Extremities: No cyanosis or edema. Lymphatics: see Neck Exam Skin: No concerning lesions. Musculoskeletal: symmetric strength and muscle tone throughout. Neurologic: Cranial nerves II through XII are grossly intact. No obvious focalities. Speech is fluent. Coordination is intact. Psychiatric: Judgment and insight are intact. Affect is appropriate.   ECOG = 2  LABORATORY DATA:  Lab Results  Component Value Date   WBC 12.9 (H) 06/06/2017   HGB 12.0 (L) 06/06/2017   HCT 36.9 (L) 06/06/2017   MCV 87.4 06/06/2017   PLT 417 (H) 06/06/2017   NEUTROABS 10.2 (H) 06/06/2017   Lab Results  Component Value Date   NA 136 06/06/2017   K 3.7 06/06/2017   CL 98 04/19/2017   CO2 26 06/06/2017   GLUCOSE 129 06/06/2017   CREATININE 1.0 06/06/2017   CALCIUM 9.3 06/06/2017      RADIOGRAPHY: Dg Chest 2  View  Result Date: 05/15/2017 CLINICAL DATA:  Left lower lobe pneumonia. EXAM: CHEST  2 VIEW COMPARISON:  Two-view chest x-ray 04/20/2017 FINDINGS: The heart is enlarged. Left pleural effusion and retrocardiac opacity remain. There slight decrease in the effusion. Mild pulmonary vascular congestion is present. Mild right basilar atelectasis is noted. IMPRESSION: 1. Persistent left lower lobe airspace opacity. Given the prolonged timeframe, CT of the chest with contrast is recommended for further evaluation of possible proximal obstructing lesion. 2. Slight decrease in left pleural effusion. 3. Minimal right basilar atelectasis. Electronically Signed   By: San Morelle M.D.   On: 05/15/2017 13:10   Ct Chest W Contrast  Result Date: 05/17/2017 CLINICAL DATA:  Followup left lower lobe pneumonia. EXAM: CT CHEST WITH CONTRAST TECHNIQUE: Multidetector CT imaging of the chest was performed during intravenous contrast administration. CONTRAST:  45m ISOVUE-300 IOPAMIDOL (ISOVUE-300) INJECTION 61% COMPARISON:  None FINDINGS: Cardiovascular: The heart size is mildly enlarged. There is aortic atherosclerosis. Calcification in the RCA, LAD, left main coronary artery is noted. No pericardial effusion. Mediastinum/Nodes: The trachea  appears patent and is midline. Normal appearance of the esophagus. No axillary or supraclavicular adenopathy. Sub- carinal node measures 11 mm, image 69 of series 2. Left infrahilar lymph node is enlarged measuring 1.8 cm, image 118 of series 5. Lungs/Pleura: Small to moderate left pleural effusion identified. There is a mass within the left lower lobe obstructing the left lower lobe bronchus measuring 5.7 x4.9 cm. Postobstructive atelectasis and consolidation of the left lower lobe noted. Upper Abdomen: Hepatic steatosis identified. No acute findings identified within the upper abdomen. Musculoskeletal: There is degenerative disc disease identified within the lower thoracic spine. No  aggressive lytic or sclerotic bone lesions. IMPRESSION: 1. Left lower lobe lung mass with associated postobstructive pneumonitis is identified and highly worrisome for primary bronchogenic carcinoma. Further investigation with PET-CT and tissue sampling is advised. 2. Enlarged left hilar and sub- carinal lymph nodes. 3. Left pleural effusion. 4. Aortic Atherosclerosis (ICD10-I70.0). Coronary artery calcifications are identified involving the RCA, LAD and left main coronary arteries. Electronically Signed   By: Kerby Moors M.D.   On: 05/17/2017 13:33      IMPRESSION: left lower lobe lung mass and left pleural effusion, presumed lung cancer, biopsy pending.  William Stanley is a  71 y.o. male who presents today to discuss the role of radiotherapy in the ongoing management of left lower lung mass. The risks vs benefits, side effects, and potential toxicities were discussed at great length with the patient. I informed them that they may experience possible sore throat and radiation-related skin changes within their treatment field. The patient appears to understand and wishes to proceed with planned course of treatment. A consent form was signed and placed in the patient's medical record. The left lower lung mass is causing postobstructive pneumonitis and the patient has had recent hemoptysis. He would be a candidate for radiation therapy either in a palliative mode or more definitive mode depending on results of this biopsy, PET scan and MRI.   PLAN: Pt will proceed with thoracentesis and potential biopsy on 06/18/17. Pt will return on 06/21/17 for radiation planning and will see Dr. Julien Nordmann on the same day. Treatments will begin the following week. Anticipate between 3 and 6 weeks of radiation therapy as part of his management.  ------------------------------------------------  Blair Promise, PhD, MD  This document serves as a record of services personally performed by Gery Pray, MD. It was  created on her behalf by Marlowe Kays, a trained medical scribe. The creation of this record is based on the scribe's personal observations and the provider's statements to them. This document has been checked and approved by the attending provider.

## 2017-06-18 ENCOUNTER — Telehealth: Payer: Self-pay | Admitting: *Deleted

## 2017-06-18 ENCOUNTER — Ambulatory Visit (HOSPITAL_COMMUNITY)
Admission: RE | Admit: 2017-06-18 | Discharge: 2017-06-18 | Disposition: A | Discharge status: Home / Self Care | Payer: Medicare HMO | Source: Ambulatory Visit | Attending: Radiology | Admitting: Radiology

## 2017-06-18 ENCOUNTER — Encounter (HOSPITAL_COMMUNITY): Payer: Self-pay | Admitting: Radiology

## 2017-06-18 ENCOUNTER — Ambulatory Visit (HOSPITAL_COMMUNITY)
Admission: RE | Admit: 2017-06-18 | Discharge: 2017-06-18 | Disposition: A | Discharge status: Home / Self Care | Payer: Medicare HMO | Source: Ambulatory Visit | Attending: Pulmonary Disease | Admitting: Pulmonary Disease

## 2017-06-18 DIAGNOSIS — J90 Pleural effusion, not elsewhere classified: Secondary | ICD-10-CM | POA: Diagnosis not present

## 2017-06-18 DIAGNOSIS — R911 Solitary pulmonary nodule: Secondary | ICD-10-CM | POA: Diagnosis not present

## 2017-06-18 DIAGNOSIS — J948 Other specified pleural conditions: Secondary | ICD-10-CM | POA: Diagnosis not present

## 2017-06-18 DIAGNOSIS — R918 Other nonspecific abnormal finding of lung field: Secondary | ICD-10-CM

## 2017-06-18 DIAGNOSIS — I700 Atherosclerosis of aorta: Secondary | ICD-10-CM | POA: Diagnosis not present

## 2017-06-18 HISTORY — PX: IR THORACENTESIS ASP PLEURAL SPACE W/IMG GUIDE: IMG5380

## 2017-06-18 MED ORDER — LIDOCAINE HCL (PF) 1 % IJ SOLN
INTRAMUSCULAR | Status: DC | PRN
  Administered 2017-06-18: 5 mL

## 2017-06-18 MED ORDER — LIDOCAINE 2% (20 MG/ML) 5 ML SYRINGE
INTRAMUSCULAR | Status: AC
  Filled 2017-06-18: qty 10

## 2017-06-18 NOTE — Telephone Encounter (Signed)
Pt states he had been holding his Coumadin since last Thursday & had fluid drawn off his lungs today. Pt states he was told to stay off his Coumadin until after MRI on Tuesday by Pulmonologist & Cancer MD. Durward Fortes that we were unaware of this at the Anticoagulation/Cardiology office & to follow their recommendations as they have told him that he may have to undergo more procedures. Educated pt on risk of stroke & blood clot while being off Coumadin & that his Cardiologist should have been notified to hold Coumadin. Advised to please call the Physicians that instructed him to hold his Coumadin for the procedures & once he resumes to call us so we are aware & so we can ensure he is scheduled at Coumadin Clinic in timely manner.  Pt states he will call back later this week to update Coumadin Clinic.

## 2017-06-18 NOTE — Procedures (Signed)
PROCEDURE SUMMARY:  Successful US guided left thoracentesis. Yielded 320 mL of hazy, amber colored fluid. Pt tolerated procedure well. No immediate complications.  Specimen was sent for labs. CXR ordered.  Ascencion Dike PA-C 06/18/2017 10:21 AM

## 2017-06-19 ENCOUNTER — Ambulatory Visit (HOSPITAL_COMMUNITY)
Admission: RE | Admit: 2017-06-19 | Discharge: 2017-06-19 | Disposition: A | Discharge status: Home / Self Care | Payer: Medicare HMO | Source: Ambulatory Visit | Attending: Oncology | Admitting: Oncology

## 2017-06-19 DIAGNOSIS — R51 Headache: Secondary | ICD-10-CM | POA: Diagnosis not present

## 2017-06-19 DIAGNOSIS — I6381 Other cerebral infarction due to occlusion or stenosis of small artery: Secondary | ICD-10-CM | POA: Insufficient documentation

## 2017-06-19 DIAGNOSIS — R918 Other nonspecific abnormal finding of lung field: Secondary | ICD-10-CM | POA: Diagnosis not present

## 2017-06-19 DIAGNOSIS — J90 Pleural effusion, not elsewhere classified: Secondary | ICD-10-CM | POA: Insufficient documentation

## 2017-06-19 MED ORDER — GADOBENATE DIMEGLUMINE 529 MG/ML IV SOLN
20.0000 mL | Freq: Once | INTRAVENOUS | Status: AC | PRN
  Administered 2017-06-19: 20 mL via INTRAVENOUS

## 2017-06-21 ENCOUNTER — Encounter: Payer: Self-pay | Admitting: Internal Medicine

## 2017-06-21 ENCOUNTER — Telehealth: Payer: Self-pay | Admitting: Pulmonary Disease

## 2017-06-21 ENCOUNTER — Telehealth: Payer: Self-pay | Admitting: Internal Medicine

## 2017-06-21 ENCOUNTER — Ambulatory Visit
Admission: RE | Admit: 2017-06-21 | Discharge: 2017-06-21 | Disposition: A | Discharge status: Home / Self Care | Payer: Medicare HMO | Source: Ambulatory Visit | Attending: Radiation Oncology | Admitting: Radiation Oncology

## 2017-06-21 ENCOUNTER — Ambulatory Visit (HOSPITAL_BASED_OUTPATIENT_CLINIC_OR_DEPARTMENT_OTHER): Payer: Medicare HMO | Admitting: Internal Medicine

## 2017-06-21 VITALS — BP 120/68 | HR 97 | Temp 98.6°F | Resp 18 | Ht 68.0 in | Wt 222.8 lb

## 2017-06-21 DIAGNOSIS — J90 Pleural effusion, not elsewhere classified: Secondary | ICD-10-CM | POA: Diagnosis not present

## 2017-06-21 DIAGNOSIS — E440 Moderate protein-calorie malnutrition: Secondary | ICD-10-CM

## 2017-06-21 DIAGNOSIS — C3432 Malignant neoplasm of lower lobe, left bronchus or lung: Secondary | ICD-10-CM | POA: Diagnosis not present

## 2017-06-21 DIAGNOSIS — R918 Other nonspecific abnormal finding of lung field: Secondary | ICD-10-CM

## 2017-06-21 DIAGNOSIS — Z510 Encounter for antineoplastic radiation therapy: Secondary | ICD-10-CM | POA: Diagnosis not present

## 2017-06-21 MED ORDER — METHYLPREDNISOLONE 4 MG PO TBPK: ORAL_TABLET | ORAL | 0 refills | Status: DC

## 2017-06-21 NOTE — Telephone Encounter (Signed)
Gave avs and calendar for November  °

## 2017-06-21 NOTE — Telephone Encounter (Signed)
Per BQ:  BQ and Dr. Inda Merlin spoke earlier today about this patient, and Dr. Inda Merlin is requesting that we bronch this pt asap.  BQ states that if the PET is at [facility] he might be a few minutes late but they could probably still ensure that pt has PET performed.   Would it be possible to reschedule PET to a little later in the morning to accomodate the bronch to be performed that morning?  PCC's please advise.  Thanks!

## 2017-06-21 NOTE — Telephone Encounter (Signed)
Agreed, really need to try to do the bronch on Monday. I could do an earlier start of the bronch or could do the bronch in the afternoon.  He really needs to have both done that day.

## 2017-06-21 NOTE — Telephone Encounter (Signed)
PCC's please advise if PET can be rescheduled to later in the morning on 06/25/17. Thanks.

## 2017-06-21 NOTE — Telephone Encounter (Signed)
Baxter Flattery returned call, Charleen Kirks has been scheduled for 11.19.18 @ 0800 at [facility] spoke with patient to inform him of the above > pt stated that he already has an appt on 11.19.18 >> verified that pt has a PET scan scheduled for that date under Mikey Bussing NP w/ Radiation Oncology @ 10am at [facility].    Dr Lake Bells, pt asks if he will be done with the bronch in time for the PET or if this needs to be rescheduled.  Per Baxter Flattery, the only other available time for bronch is Wednesday 11.21.18 @ at any time.    Please advise, thank you.

## 2017-06-21 NOTE — Telephone Encounter (Signed)
Notes recorded by Juanito Doom, MD on 06/20/2017 at 12:57 PM EST A, Please let the patient know this did not show evidence of cancer, so we need to perform a bronchoscopy. I can do any time next week at [facility]. Will need fluoro. No TB risk Thanks, B  Spoke with patient. He is aware of results. Wishes to proceed with bronch.   Called [facility] to schedule bronch, but had to leave a message. Will keep this message in triage until it is scheduled.

## 2017-06-21 NOTE — Progress Notes (Signed)
  Radiation Oncology         [phone] ________________________________  Name: William Stanley MRN: [id number]  Date: 06/21/2017  DOB: [birth year]  SIMULATION AND TREATMENT PLANNING NOTE  DIAGNOSIS: T3 N2 M1a squamous cell carcinoma of the left lower lung (stage IV).  NARRATIVE:  The patient was brought to the [facility].  Identity was confirmed.  All relevant records and images related to the planned course of therapy were reviewed.  The patient freely provided informed written consent to proceed with treatment after reviewing the details related to the planned course of therapy. The consent form was witnessed and verified by the simulation staff.  Then, the patient was set-up in a stable reproducible  supine position for radiation therapy.  CT images were obtained.  Surface markings were placed.  The CT images were loaded into the planning software.  Then the target and avoidance structures were contoured.  Treatment planning then occurred.  The radiation prescription was entered and confirmed.  Then, I designed and supervised the construction of a total of 5 medically necessary complex treatment devices.  I have requested : 3D Simulation  I have requested a DVH of the following structures: Gross tumor volume, planning target volume, esophagus, lungs, heart, spinal cord.  I have ordered:dose calc.  PLAN:  The patient will receive 35 Gy in 14 fractions.    IMPRESSION: (PET scan 06/25/17) 1. 16.6 cm hypermetabolic centrally necrotic left lower lobe mass with surrounding postobstructive pneumonitis. There is hypermetabolic left infrahilar adenopathy along with small but hypermetabolic subcarinal and left paratracheal lymph nodes. There is also a hypermetabolic rind along the small left pleural effusion parietal pleural margin suspicious for malignant effusion. Assuming non-small lung extensor the appearance would suggest T3 N2 M1a disease (stage IV). 2. No findings of  distant metastatic spread to the neck, abdomen/pelvis, or skeleton.   Diagnosis BRONCHIAL BRUSHING ,LLL (SPECIMEN 1 OF 2 COLLECTED 06/25/17): MALIGNANT CELLS CONSISTENT WITH SQUAMOUS CELL CARCINOMA. -----------------------------------  Blair Promise, PhD, MD  This document serves as a record of services personally performed by Gery Pray, MD. It was created on his behalf by Valeta Harms, a trained medical scribe. The creation of this record is based on the scribe's personal observations and the provider's statements to them. This document has been checked and approved by the attending provider.

## 2017-06-21 NOTE — Telephone Encounter (Signed)
Called [facility] they tried 3 times while I was on the phone to reach nuc med they will try again and call me back

## 2017-06-21 NOTE — Progress Notes (Signed)
[facility] [phone]   [phone]  OFFICE PROGRESS NOTE  [name], PA-C [id number] [place] [address]  DIAGNOSIS: Highly suspicious for stage IV (T2b, N2, M0) lung cancer suspicious for non-small cell carcinoma presented with right lower lobe lung mass with associated postobstructive pneumonitis as well as left hilar and subcarinal lymphadenopathy and left pleural effusion, Pending tissue diagnosis.  PRIOR THERAPY: None  CURRENT THERAPY: None  INTERVAL HISTORY: William Stanley 71 y.o. male returns to the clinic today for returns to the clinic today for follow-up visit accompanied by his wife.  The patient continues to complain of increasing fatigue and weakness.  He also has shortness of breath at baseline increased with exertion.  He denied having any current fever or chills.  He has no nausea, vomiting, diarrhea or constipation.  He was referred to Dr. Sondra Come for consideration of palliative radiotherapy to the obstructive right lower lobe lung mass.  Dr. Sondra Come there is a still awaiting the final tissue diagnosis before the treatment.  The patient underwent ultrasound-guided left thoracentesis but the final cytology was negative for malignancy.  He had MRI of the brain that was unremarkable for any disease progression to the brain.  The patient was supposed to have a PET scan before this visit but unfortunately for scheduling issues has a scan as a scheduled for June 25, 2017.  He is here today for evaluation and discussion of his condition.  MEDICAL HISTORY: Past Medical History:  Diagnosis Date  . Atrial fibrillation ([facility])   . Diabetes ([facility])   . DVT (deep venous thrombosis) ([facility])   . Edema of lower extremity   . Hypertension   . Irritable bowel syndrome (IBS)   . Lung collapse    LEFT LUNG, TWICE  . Pulmonary embolism ([facility])     ALLERGIES:  is allergic to penicillins.  MEDICATIONS:  Current Outpatient Medications   Medication Sig Dispense Refill  . ACCU-CHEK AVIVA PLUS test strip CHECK BLOOD SUGAR THREE TIMES DAILY 250 each 0  . ACCU-CHEK SOFTCLIX LANCETS lancets CHECK BLOOD SUGAR THREE TIMES DAILY 200 each 0  . albuterol (VENTOLIN HFA) 108 (90 Base) MCG/ACT inhaler INHALE 1-2 PUFFS INTO LUNGS Every 4 hours AS NEEDED 18 g 1  . Alcohol Swabs (ALCOHOL PADS) 70 % PADS Use to check blood sugar daily 100 each 11  . amLODipine (NORVASC) 5 MG tablet TAKE 1 TABLET BY MOUTH EVERY DAY 90 tablet 1  . benazepril (LOTENSIN) 10 MG tablet TAKE 1 TABLET BY MOUTH EVERY DAY 90 tablet 1  . blood glucose meter kit and supplies KIT Dispense based on patient and insurance preference. Check blood sugar three times daily  (FOR ICD-10E11.5). 1 each 0  . cetirizine (ZYRTEC) 10 MG tablet Take 1 tablet (10 mg total) by mouth daily. 30 tablet 11  . fluticasone (FLONASE) 50 MCG/ACT nasal spray Place 2 sprays into both nostrils daily. (Patient not taking: Reported on 06/13/2017) 16 g 6  . metFORMIN (GLUCOPHAGE) 500 MG tablet TAKE 1 TABLET BY MOUTH TWICE DAILY WITH MEALS 60 tablet 0  . pravastatin (PRAVACHOL) 20 MG tablet Take 1 tablet (20 mg total) by mouth daily. 90 tablet 3  . tamsulosin (FLOMAX) 0.4 MG CAPS capsule Take 1 capsule (0.4 mg total) by mouth daily. 90 capsule 1  . VENTOLIN HFA 108 (90 Base) MCG/ACT inhaler INHALE 1-2 PUFFS INTO LUNGS AS NEEDED 18 g 3  . warfarin (COUMADIN) 5  MG tablet Take 1 to 1 and 1/2 tablets daily as  directed by [facility]. 135 tablet 0   No current facility-administered medications for this visit.     SURGICAL HISTORY:  Past Surgical History:  Procedure Laterality Date  . EYE SURGERY    . HERNIA REPAIR  1992  . IR THORACENTESIS ASP PLEURAL SPACE W/IMG GUIDE  06/18/2017  . VENOUS LIGATION SURGERY  1988    REVIEW OF SYSTEMS:  Constitutional: positive for fatigue and weight loss Eyes: negative Ears, nose, mouth, throat, and face: negative Respiratory: positive for cough and dyspnea on  exertion Cardiovascular: negative Gastrointestinal: negative Genitourinary:negative Integument/breast: negative Hematologic/lymphatic: negative Musculoskeletal:positive for arthralgias and muscle weakness Neurological: negative Behavioral/Psych: negative Endocrine: negative Allergic/Immunologic: negative   PHYSICAL EXAMINATION: General appearance: alert, cooperative, fatigued and no distress Head: Normocephalic, without obvious abnormality, atraumatic Neck: no adenopathy, no JVD, supple, symmetrical, trachea midline and thyroid not enlarged, symmetric, no tenderness/mass/nodules Lymph nodes: Cervical, supraclavicular, and axillary nodes normal. Resp: diminished breath sounds LLL and dullness to percussion LLL Back: symmetric, no curvature. ROM normal. No CVA tenderness. Cardio: regular rate and rhythm, S1, S2 normal, no murmur, click, rub or gallop GI: soft, non-tender; bowel sounds normal; no masses,  no organomegaly Extremities: extremities normal, atraumatic, no cyanosis or edema Neurologic: Alert and oriented X 3, normal strength and tone. Normal symmetric reflexes. Normal coordination and gait  ECOG PERFORMANCE STATUS: 1 - Symptomatic but completely ambulatory  Blood pressure 120/68, pulse 97, temperature 98.6 F (37 C), temperature source Oral, resp. rate 18, height '5\' 8"'$  (1.727 m), weight 222 lb 12.8 oz (101.1 kg), SpO2 94 %.  LABORATORY DATA: Lab Results  Component Value Date   WBC 12.9 (H) 06/06/2017   HGB 12.0 (L) 06/06/2017   HCT 36.9 (L) 06/06/2017   MCV 87.4 06/06/2017   PLT 417 (H) 06/06/2017      Chemistry      Component Value Date/Time   NA 136 06/06/2017 0759   K 3.7 06/06/2017 0759   CL 98 04/19/2017 1219   CO2 26 06/06/2017 0759   BUN 12.7 06/06/2017 0759   CREATININE 1.0 06/06/2017 0759      Component Value Date/Time   CALCIUM 9.3 06/06/2017 0759   ALKPHOS 76 06/06/2017 0759   AST 32 06/06/2017 0759   ALT 42 06/06/2017 0759   BILITOT 0.64  06/06/2017 0759       RADIOGRAPHIC STUDIES: Dg Chest 1 View  Result Date: 06/18/2017 CLINICAL DATA:  Status post left-sided thoracentesis EXAM: CHEST 1 VIEW COMPARISON:  Chest radiograph May 15, 2017 and chest CT May 17, 2017 FINDINGS: No pneumothorax. There is apical pleural thickening bilaterally which is stable. There is airspace consolidation in the left lower lobe with small residual left pleural effusion. Right lung is clear. Heart is mildly enlarged with pulmonary vascularity within normal limits. No adenopathy. No bone lesions. There is aortic atherosclerosis. IMPRESSION: No pneumothorax. Left lower lobe consolidation, likely pneumonia, with small residual left pleural effusion. Right lung clear. Heart is mildly enlarged. There is aortic atherosclerosis. Aortic Atherosclerosis (ICD10-I70.0). Electronically Signed   By: Lowella Grip III M.D.   On: 06/18/2017 10:35   Mr Jeri Cos PZ Contrast  Result Date: 06/19/2017 CLINICAL DATA:  71 year old male with left lower lobe lung mass diagnosed last month. Staging. EXAM: MRI HEAD WITHOUT AND WITH CONTRAST TECHNIQUE: Multiplanar, multiecho pulse sequences of the brain and surrounding structures were obtained without and with intravenous contrast. CONTRAST:  42m MULTIHANCE GADOBENATE DIMEGLUMINE 529 MG/ML  IV SOLN COMPARISON:  Chest CT 05/17/2017.  Paranasal sinus CT 10/05/2008. FINDINGS: Brain: No abnormal enhancement identified. No midline shift, mass effect, or evidence of intracranial mass lesion. No dural thickening. No restricted diffusion to suggest acute infarction. No ventriculomegaly, extra-axial collection or acute intracranial hemorrhage. Cervicomedullary junction and pituitary are within normal limits. Small chronic lacunar infarct in the right cerebellum. Patchy central pons and Patchy and confluent scattered bilateral cerebral white matter T2 and FLAIR hyperintensity, moderate for age. No cortical encephalomalacia or chronic  cerebral blood products identified. There is only deep gray matter nuclei, primarily the thalami. Mild T2 heterogeneity in the Vascular: Major intracranial vascular flow voids are preserved with dominant distal left vertebral artery and mild generalized intracranial artery tortuosity. Skull and upper cervical spine: Visualized bone marrow signal is within normal limits. Negative visualized cervical spine. Sinuses/Orbits: Postoperative changes to both globes, otherwise normal orbit soft tissues. Visualized paranasal sinuses and mastoids are stable and well pneumatized. Other: Visible internal auditory structures appear normal. Scalp and face soft tissues appear negative. IMPRESSION: 1.  No metastatic disease or acute intracranial abnormality. 2. Moderate for age nonspecific signal changes in the cerebral white matter and pons are probably chronic small vessel disease related. Small chronic lacune in the right cerebellum. Electronically Signed   By: Genevie Ann M.D.   On: 06/19/2017 11:36   Ir Thoracentesis Asp Pleural Space W/img Guide  Result Date: 06/18/2017 INDICATION: Left-sided pulmonary nodule with associated pleural effusion. Request for diagnostic and therapeutic thoracentesis. EXAM: ULTRASOUND GUIDED LEFT THORACENTESIS MEDICATIONS: None. COMPLICATIONS: None immediate. Postprocedural chest x-ray negative for pneumothorax. PROCEDURE: An ultrasound guided thoracentesis was thoroughly discussed with the patient and questions answered. The benefits, risks, alternatives and complications were also discussed. The patient understands and wishes to proceed with the procedure. Written consent was obtained. Ultrasound was performed to localize and mark an adequate pocket of fluid in the left chest. The area was then prepped and draped in the normal sterile fashion. 1% Lidocaine was used for local anesthesia. Under ultrasound guidance a Safe-T-Centesis catheter was introduced. Thoracentesis was performed. The catheter  was removed and a dressing applied. FINDINGS: A total of approximately 320 mL of hazy, amber colored fluid was removed. Samples were sent to the laboratory as requested by the clinical team. IMPRESSION: Successful ultrasound guided left thoracentesis yielding 320 mL of pleural fluid. Read by: Ascencion Dike PA-C Electronically Signed   By: Aletta Edouard M.D.   On: 06/18/2017 11:10    ASSESSMENT AND PLAN: This is a very pleasant 71 years old white male with highly suspicious stage IV lung cancer probably non-small cell carcinoma presented with left lower lobe obstructive lung mass with postobstructive pneumonitis as well as left hilar and mediastinal lymphadenopathy and left pleural effusion.  The patient has no tissue diagnosis until now.  Unfortunately his PET scan has been delayed for almost 3 weeks now.  We will try to get the PET scan done as soon as possible to choose the right lesion for tissue diagnosis. I will also refer the patient back to Dr. Lake Bells for consideration of bronchoscopy for tissue diagnosis. I had a lengthy discussion with the patient and his wife today about his condition and treatment options.  I discussed with him the results of the MRI of the brain as well as the cytology from the pleural fluid. I will arrange for the patient to come back for follow-up visit in 2 weeks for reevaluation and more detailed discussion of his treatment options after  the tissue diagnosis. For the lack of appetite and malnutrition, I would refer the patient to the dietitian at the cancer center for evaluation of his condition.  I would also start the patient on Medrol Dosepak. The patient was advised to call immediately if he has any concerning symptoms in the interval. The patient voices understanding of current disease status and treatment options and is in agreement with the current care plan.  All questions were answered. The patient knows to call the clinic with any problems, questions or  concerns. We can certainly see the patient much sooner if necessary.  I spent 15 minutes counseling the patient face to face. The total time spent in the appointment was 25 minutes.  Disclaimer: This note was dictated with voice recognition software. Similar sounding words can inadvertently be transcribed and may not be corrected upon review.

## 2017-06-22 NOTE — Telephone Encounter (Signed)
Spoke with pt and spouse, aware of recs.    Forwarding to BQ to make aware.  Nothing further needed.

## 2017-06-22 NOTE — Telephone Encounter (Signed)
I got this moved to 06/25/17 at 1:00 arrival time gave to Caryl Pina so she could make sure the bronch was set

## 2017-06-22 NOTE — Telephone Encounter (Signed)
noted 

## 2017-06-23 ENCOUNTER — Other Ambulatory Visit: Payer: Self-pay | Admitting: Physician Assistant

## 2017-06-25 ENCOUNTER — Ambulatory Visit (HOSPITAL_COMMUNITY): Payer: Medicare HMO

## 2017-06-25 ENCOUNTER — Ambulatory Visit (HOSPITAL_COMMUNITY)
Admission: RE | Admit: 2017-06-25 | Discharge: 2017-06-25 | Disposition: A | Discharge status: Home / Self Care | Payer: Medicare HMO | Source: Ambulatory Visit | Attending: Pulmonary Disease | Admitting: Pulmonary Disease

## 2017-06-25 ENCOUNTER — Ambulatory Visit: Payer: Medicare HMO | Admitting: Radiation Oncology

## 2017-06-25 ENCOUNTER — Encounter (HOSPITAL_COMMUNITY)
Admission: RE | Disposition: A | Discharge status: Home / Self Care | Payer: Self-pay | Source: Ambulatory Visit | Attending: Pulmonary Disease

## 2017-06-25 ENCOUNTER — Ambulatory Visit: Payer: Medicare HMO

## 2017-06-25 ENCOUNTER — Ambulatory Visit (HOSPITAL_COMMUNITY)
Admission: RE | Admit: 2017-06-25 | Discharge: 2017-06-25 | Disposition: A | Discharge status: Home / Self Care | Payer: Medicare HMO | Source: Ambulatory Visit | Attending: Oncology | Admitting: Oncology

## 2017-06-25 DIAGNOSIS — R918 Other nonspecific abnormal finding of lung field: Secondary | ICD-10-CM

## 2017-06-25 DIAGNOSIS — K589 Irritable bowel syndrome without diarrhea: Secondary | ICD-10-CM | POA: Diagnosis not present

## 2017-06-25 DIAGNOSIS — E119 Type 2 diabetes mellitus without complications: Secondary | ICD-10-CM | POA: Insufficient documentation

## 2017-06-25 DIAGNOSIS — C3432 Malignant neoplasm of lower lobe, left bronchus or lung: Secondary | ICD-10-CM | POA: Insufficient documentation

## 2017-06-25 DIAGNOSIS — Z86711 Personal history of pulmonary embolism: Secondary | ICD-10-CM | POA: Insufficient documentation

## 2017-06-25 DIAGNOSIS — J90 Pleural effusion, not elsewhere classified: Secondary | ICD-10-CM

## 2017-06-25 DIAGNOSIS — Z880 Allergy status to penicillin: Secondary | ICD-10-CM | POA: Diagnosis not present

## 2017-06-25 DIAGNOSIS — Z87891 Personal history of nicotine dependence: Secondary | ICD-10-CM | POA: Insufficient documentation

## 2017-06-25 DIAGNOSIS — Z7952 Long term (current) use of systemic steroids: Secondary | ICD-10-CM | POA: Diagnosis not present

## 2017-06-25 DIAGNOSIS — I10 Essential (primary) hypertension: Secondary | ICD-10-CM | POA: Diagnosis not present

## 2017-06-25 DIAGNOSIS — Z8249 Family history of ischemic heart disease and other diseases of the circulatory system: Secondary | ICD-10-CM | POA: Diagnosis not present

## 2017-06-25 DIAGNOSIS — I4891 Unspecified atrial fibrillation: Secondary | ICD-10-CM | POA: Insufficient documentation

## 2017-06-25 DIAGNOSIS — Z79899 Other long term (current) drug therapy: Secondary | ICD-10-CM | POA: Diagnosis not present

## 2017-06-25 DIAGNOSIS — Z7901 Long term (current) use of anticoagulants: Secondary | ICD-10-CM | POA: Insufficient documentation

## 2017-06-25 DIAGNOSIS — Z91013 Allergy to seafood: Secondary | ICD-10-CM | POA: Diagnosis not present

## 2017-06-25 HISTORY — PX: VIDEO BRONCHOSCOPY: SHX5072

## 2017-06-25 LAB — GLUCOSE, CAPILLARY
GLUCOSE-CAPILLARY: 102 mg/dL — AB (ref 65–99)
Glucose-Capillary: 119 mg/dL — ABNORMAL HIGH (ref 65–99)

## 2017-06-25 SURGERY — BRONCHOSCOPY, WITH FLUOROSCOPY
Anesthesia: Moderate Sedation | Laterality: Bilateral

## 2017-06-25 MED ORDER — FENTANYL CITRATE (PF) 100 MCG/2ML IJ SOLN
INTRAMUSCULAR | Status: AC
  Filled 2017-06-25: qty 4

## 2017-06-25 MED ORDER — BUTAMBEN-TETRACAINE-BENZOCAINE 2-2-14 % EX AERO: 1.0000 | INHALATION_SPRAY | Freq: Once | CUTANEOUS | Status: DC

## 2017-06-25 MED ORDER — FLUDEOXYGLUCOSE F - 18 (FDG) INJECTION
12.5000 | Freq: Once | INTRAVENOUS | Status: AC | PRN
  Administered 2017-06-25: 12.5 via INTRAVENOUS

## 2017-06-25 MED ORDER — LIDOCAINE HCL 2 % EX GEL
CUTANEOUS | Status: DC | PRN
  Administered 2017-06-25: 1

## 2017-06-25 MED ORDER — SODIUM CHLORIDE 0.9 % IV SOLN
INTRAVENOUS | Status: DC
  Administered 2017-06-25: 08:00:00 via INTRAVENOUS

## 2017-06-25 MED ORDER — LIDOCAINE HCL 2 % EX GEL: 1.0000 "application " | Freq: Once | CUTANEOUS | Status: DC

## 2017-06-25 MED ORDER — PHENYLEPHRINE HCL 0.25 % NA SOLN: 1.0000 | Freq: Four times a day (QID) | NASAL | Status: DC | PRN

## 2017-06-25 MED ORDER — EPINEPHRINE PF 1 MG/10ML IJ SOSY
PREFILLED_SYRINGE | INTRAMUSCULAR | Status: DC | PRN
  Administered 2017-06-25: 0.2 mg via ENDOTRACHEOPULMONARY

## 2017-06-25 MED ORDER — MIDAZOLAM HCL 5 MG/ML IJ SOLN
INTRAMUSCULAR | Status: AC
  Filled 2017-06-25: qty 2

## 2017-06-25 MED ORDER — LIDOCAINE HCL (PF) 1 % IJ SOLN
INTRAMUSCULAR | Status: DC | PRN
  Administered 2017-06-25: 6 mL

## 2017-06-25 MED ORDER — MIDAZOLAM HCL 10 MG/2ML IJ SOLN
INTRAMUSCULAR | Status: DC | PRN
  Administered 2017-06-25: 1 mg via INTRAVENOUS
  Administered 2017-06-25: 2 mg via INTRAVENOUS

## 2017-06-25 MED ORDER — FENTANYL CITRATE (PF) 100 MCG/2ML IJ SOLN
INTRAMUSCULAR | Status: DC | PRN
  Administered 2017-06-25: 25 ug via INTRAVENOUS
  Administered 2017-06-25: 50 ug via INTRAVENOUS

## 2017-06-25 MED ORDER — PHENYLEPHRINE HCL 0.25 % NA SOLN
NASAL | Status: DC | PRN
  Administered 2017-06-25: 2 via NASAL

## 2017-06-25 NOTE — Telephone Encounter (Signed)
Refill appropriate 

## 2017-06-25 NOTE — Discharge Instructions (Signed)
Flexible Bronchoscopy, Care After These instructions give you information on caring for yourself after your procedure. Your doctor may also give you more specific instructions. Call your doctor if you have any problems or questions after your procedure. Follow these instructions at home:  Do not eat or drink anything for 2 hours after your procedure. If you try to eat or drink before the medicine wears off, food or drink could go into your lungs. You could also burn yourself.  After 2 hours have passed and when you can cough and gag normally, you may eat soft food and drink liquids slowly.  The day after the test, you may eat your normal diet.  You may do your normal activities.  Keep all doctor visits. Get help right away if:  You get more and more short of breath.  You get light-headed.  You feel like you are going to pass out (faint).  You have chest pain.  You have new problems that worry you.  You cough up more than a little blood.  You cough up more blood than before.  Do not eat or drink until after 1030 today 06/25/17    This information is not intended to replace advice given to you by your health care provider. Make sure you discuss any questions you have with your health care provider. Document Released: 05/21/2009 Document Revised: 12/30/2015 Document Reviewed: 03/28/2013 Elsevier Interactive Patient Education  2017 [employer].

## 2017-06-25 NOTE — Op Note (Signed)
[facility] Cardiopulmonary Patient Name: William Stanley Procedure Date: 06/25/2017 MRN: [id number] Attending MD: Juanito Doom , MD Date of Birth: 1946-01-27 CSN: [id number] Age: 71 Admit Type: Outpatient Ethnicity: Not Hispanic or Latino Procedure:            Bronchoscopy Indications:          Left lower lobe mass Providers:            Nathaneil Canary B. Amiere Cawley, MD, Cherre Huger RRT, RCP, Ashley Mariner RRT,RCP Referring MD:          Medicines:            Midazolam 3 mg mg IV, Fentanyl 75 mcg IV Complications:        No immediate complications Estimated Blood Loss: Estimated blood loss was 30-35cc blood. 2cc epinephrine                        was administered to the left lower lobe to control the                        bleeding. Procedure:      Pre-Anesthesia Assessment:      - A History and Physical has been performed. Patient meds and allergies       have been reviewed. The risks and benefits of the procedure and the       sedation options and risks were discussed with the patient. All       questions were answered and informed consent was obtained. Patient       identification and proposed procedure were verified prior to the       procedure by the physician and the technician in the procedure room.       Mental Status Examination: alert and oriented. Airway Examination:       normal oropharyngeal airway. Respiratory Examination: clear to       auscultation. CV Examination: irregularly irregular rate and rhythm. ASA       Grade Assessment: II - A patient with mild systemic disease. After       reviewing the risks and benefits, the patient was deemed in satisfactory       condition to undergo the procedure. The anesthesia plan was to use       moderate sedation / analgesia (conscious sedation). Immediately prior to       administration of medications, the patient was re-assessed for adequacy       to receive sedatives. The heart rate,  respiratory rate, oxygen       saturations, blood pressure, adequacy of pulmonary ventilation, and       response to care were monitored throughout the procedure. The physical       status of the patient was re-assessed after the procedure.      After obtaining informed consent, the bronchoscope was passed under       direct vision. Throughout the procedure, the patient's blood pressure,       pulse, and oxygen saturations were monitored continuously. the EB [id number]       [id number] Bronchoscope was introduced through the right nostril and       advanced to the tracheobronchial tree. The procedure was accomplished       without difficulty. The patient tolerated the procedure well.  The total       duration of the procedure was 12 minutes. Findings:      The nasopharynx/oropharynx appears normal. The larynx appears normal.       The vocal cords appear normal. The subglottic space is normal. The       trachea is of normal caliber. The carina is sharp. The tracheobronchial       tree of the right lung was examined to at least the first subsegmental       level. Bronchial mucosa and anatomy in the right lung are normal; there       are no endobronchial lesions, and no secretions.      Left Lung Abnormalities: A completely obstructing mass was found       proximally, at the orifice in the anterior medial segment of the left       lower lobe (B7 & B8), in the lateral basal segment of the left lower       lobe (B9) and in the posterior basal segment of the left lower lobe       (B10). The mass was large and endobronchial and friable, see attached       picture. The lesion was not traversed. Fluoroscopically guided       transbronchial brushings were obtained in the left lower lobe and sent       for routine cytology. Only one sample could be obtained due to       significant bleeding. Washings were obtained in the left lower lobe and       sent for routine cytology. The return was  bloody. Impression:      - Left lower lobe mass      - The right lung was normal.      - An endobronchial and friable mass was found in the anterior medial       segment of the left lower lobe (B7 & B8), in the lateral basal segment       of the left lower lobe (B9) and in the posterior basal segment of the       left lower lobe (B10). This lesion is likely malignant.      - Fluoroscopically guided transbronchial brushings were obtained.      - Washings were obtained. Moderate Sedation:      Moderate (conscious) sedation was personally administered by the       endoscopist. The following parameters were monitored: oxygen saturation,       heart rate, blood pressure, and response to care. Total physician       intraservice time was 19 minutes. Recommendation:      - Discharge patient to home (ambulatory).      - Await brushing and cytology results. Procedure Code(s):      --- Professional ---      [phone], Bronchoscopy, rigid or flexible, including fluoroscopic guidance,       when performed; with brushing or protected brushings      99152, Moderate sedation services provided by the same physician or       other qualified health care professional performing the diagnostic or       therapeutic service that the sedation supports, requiring the presence       of an independent trained observer to assist in the monitoring of the       patient's level of consciousness and physiological status; initial 15       minutes of intraservice time, patient  age 61 years or older Diagnosis Code(s):      --- Professional ---      R91.8, Other nonspecific abnormal finding of lung field      J98.9, Respiratory disorder, unspecified CPT copyright 2016 American Medical Association. All rights reserved. The codes documented in this report are preliminary and upon coder review may  be revised to meet current compliance requirements. [name], MD Juanito Doom, MD 06/25/2017 8:54:42  AM This report has been signed electronically. Number of Addenda: 0 Scope In: 8:29:53 AM Scope Out: 8:42:01 AM

## 2017-06-25 NOTE — Progress Notes (Signed)
Video bronchoscopy performed Intervention bronchial washings Intervention bronchial brushings   Kathie Dike  RRT

## 2017-06-25 NOTE — H&P (Signed)
[facility]  HPI: Mr. Huston presented to my clinic for a lung mass seen on CT chest.  We had a thoracentesis performed last week that was non-diagnostic.  He is here today for a bronchoscopy.  He is holding his coumadin.  Past Medical History:  Diagnosis Date  . Atrial fibrillation ([place])   . Diabetes ([place])   . DVT (deep venous thrombosis) ([place])   . Edema of lower extremity   . Hypertension   . Irritable bowel syndrome (IBS)   . Lung collapse    LEFT LUNG, TWICE  . Pulmonary embolism ([facility])      Family History  Problem Relation Age of Onset  . Heart attack Mother   . Heart failure Mother   . Arrhythmia Father   . Heart failure Father      Social History   Socioeconomic History  . Marital status: Married    Spouse name: Not on file  . Number of children: Not on file  . Years of education: Not on file  . Highest education level: Not on file  Social Needs  . Financial resource strain: Not on file  . Food insecurity - worry: Not on file  . Food insecurity - inability: Not on file  . Transportation needs - medical: Not on file  . Transportation needs - non-medical: Not on file  Occupational History  . Not on file  Tobacco Use  . Smoking status: Former Smoker    Packs/day: 0.25    Years: 15.00    Pack years: 3.75    Types: Cigarettes    Last attempt to quit: 07/07/2012    Years since quitting: 4.9  . Smokeless tobacco: Never Used  Substance and Sexual Activity  . Alcohol use: No    Alcohol/week: 0.0 oz  . Drug use: No  . Sexual activity: Not on file  Other Topics Concern  . Not on file  Social History Narrative  . Not on file     Allergies  Allergen Reactions  . Fish Allergy     Sweaty, upset stomach  . Penicillins     Blackouts  Has patient had a PCN reaction causing immediate rash, facial/tongue/throat swelling, SOB or lightheadedness with hypotension: Yes Has patient had a PCN reaction causing severe rash involving mucus membranes or skin necrosis: No Has  patient had a PCN reaction that required hospitalization: No Has patient had a PCN reaction occurring within the last 10 years: No If all of the above answers are "NO", then may proceed with Cephalosporin use.      @encmedstart @  Vitals:   06/25/17 0800 06/25/17 0805 06/25/17 0810 06/25/17 0815  BP: (!) 162/86 (!) 152/76 (!) 160/92 (!) 153/92  Resp: (!) 22 (!) 22 18 (!) 24  Temp:      SpO2: 100% 99% 99% 99%   Gen: chronically ill appearing HENT: OP clear, TM's clear, neck supple PULM: CTA B, normal percussion CV: RRR, no mgr, trace edema GI: BS+, soft, nontender Derm: no cyanosis or rash Psyche: normal mood and affect   CBC    Component Value Date/Time   WBC 12.9 (H) 06/06/2017 0759   WBC 10.9 (H) 04/19/2017 1218   RBC 4.22 06/06/2017 0759   RBC 4.29 04/19/2017 1218   HGB 12.0 (L) 06/06/2017 0759   HCT 36.9 (L) 06/06/2017 0759   PLT 417 (H) 06/06/2017 0759   MCV 87.4 06/06/2017 0759   MCH 28.5 06/06/2017 0759   MCH 31.0 04/19/2017 1218   MCHC  32.6 06/06/2017 0759   MCHC 33.3 04/19/2017 1218   RDW 14.1 06/06/2017 0759   LYMPHSABS 1.5 06/06/2017 0759   MONOABS 1.0 (H) 06/06/2017 0759   EOSABS 0.1 06/06/2017 0759   BASOSABS 0.1 06/06/2017 0759   CT chest: left lower lobe mass, small pleural effusion  Impression/Plan:  Left lower lobe mass: Plan bronchoscopy with biopsy  Roselie Awkward, MD [facility] [place] [facility] Pager: [phone] Cell: [phone] After 3pm or if no response, call [phone]

## 2017-06-26 ENCOUNTER — Ambulatory Visit: Payer: Medicare HMO

## 2017-06-26 ENCOUNTER — Telehealth: Payer: Self-pay | Admitting: Internal Medicine

## 2017-06-26 NOTE — Telephone Encounter (Signed)
R/s appt per [facility] request from 11/29 to a day when Dr. Julien Nordmann would be in the office to review scans - patient is aware of appt date and time - verbalized understanding.

## 2017-06-27 ENCOUNTER — Encounter (HOSPITAL_COMMUNITY): Payer: Self-pay | Admitting: Pulmonary Disease

## 2017-06-27 ENCOUNTER — Telehealth: Payer: Self-pay | Admitting: Pulmonary Disease

## 2017-06-27 ENCOUNTER — Ambulatory Visit: Payer: Medicare HMO

## 2017-06-27 NOTE — Telephone Encounter (Signed)
I called William Stanley to review the results of his bronchoscopy which showed squamous cell carcinoma of the lung.  He voiced understanding and says he plans to keep his appointments with Oncology next week.

## 2017-06-29 ENCOUNTER — Telehealth: Payer: Self-pay | Admitting: Pulmonary Disease

## 2017-06-29 MED ORDER — LEVOFLOXACIN 750 MG PO TABS: 750.0000 mg | ORAL_TABLET | Freq: Every day | ORAL | 0 refills | Status: DC

## 2017-06-29 NOTE — Telephone Encounter (Signed)
Spoke with pt, he states he has been coughing up brown mucus with a horrible taste, since the bronch procedure. He has bad spells of coughing and took some Robitussin but it doesn't help. He is coughing so hard it makes him vomit but he has not eaten in the last two days. Please advise   [facility] [place]

## 2017-06-29 NOTE — Telephone Encounter (Signed)
Unfortunately there's no quick fix here especially while on ACEi with a new dx of lung ca but can try levaquin 750 mg daily x 5 days but need to let the coumadin doser know he's on abx and his pcp he may need to be off the acei as the cough is likely  To be a chronic issue and the acei will just confuse the picture.

## 2017-06-29 NOTE — Telephone Encounter (Signed)
Spoke with the pt and notified of recs per MW  He verbalized understanding  Rx was sent to pharm

## 2017-07-02 ENCOUNTER — Ambulatory Visit: Admission: RE | Admit: 2017-07-02 | Payer: Medicare HMO | Source: Ambulatory Visit | Admitting: Radiation Oncology

## 2017-07-02 ENCOUNTER — Ambulatory Visit: Payer: Medicare HMO | Admitting: Radiation Oncology

## 2017-07-02 ENCOUNTER — Other Ambulatory Visit: Payer: Self-pay | Admitting: Oncology

## 2017-07-02 ENCOUNTER — Other Ambulatory Visit: Payer: Self-pay | Admitting: Physician Assistant

## 2017-07-02 ENCOUNTER — Other Ambulatory Visit: Payer: Self-pay | Admitting: Radiation Oncology

## 2017-07-02 ENCOUNTER — Ambulatory Visit
Admission: RE | Admit: 2017-07-02 | Discharge: 2017-07-02 | Disposition: A | Discharge status: Home / Self Care | Payer: Medicare HMO | Source: Ambulatory Visit

## 2017-07-02 ENCOUNTER — Ambulatory Visit
Admission: RE | Admit: 2017-07-02 | Discharge: 2017-07-02 | Disposition: A | Discharge status: Home / Self Care | Payer: Medicare HMO | Source: Ambulatory Visit | Attending: Radiation Oncology | Admitting: Radiation Oncology

## 2017-07-02 DIAGNOSIS — R918 Other nonspecific abnormal finding of lung field: Secondary | ICD-10-CM

## 2017-07-02 DIAGNOSIS — R351 Nocturia: Principal | ICD-10-CM

## 2017-07-02 DIAGNOSIS — Z510 Encounter for antineoplastic radiation therapy: Secondary | ICD-10-CM | POA: Diagnosis not present

## 2017-07-02 DIAGNOSIS — C3432 Malignant neoplasm of lower lobe, left bronchus or lung: Secondary | ICD-10-CM | POA: Diagnosis not present

## 2017-07-02 DIAGNOSIS — N401 Enlarged prostate with lower urinary tract symptoms: Secondary | ICD-10-CM

## 2017-07-02 LAB — COMPREHENSIVE METABOLIC PANEL
ALBUMIN: 2 g/dL — AB (ref 3.5–5.0)
ALK PHOS: 129 U/L (ref 40–150)
ALT: 58 U/L — AB (ref 0–55)
ANION GAP: 11 meq/L (ref 3–11)
AST: 69 U/L — ABNORMAL HIGH (ref 5–34)
BILIRUBIN TOTAL: 0.65 mg/dL (ref 0.20–1.20)
BUN: 20.1 mg/dL (ref 7.0–26.0)
CALCIUM: 9.6 mg/dL (ref 8.4–10.4)
CO2: 28 meq/L (ref 22–29)
CREATININE: 1 mg/dL (ref 0.7–1.3)
Chloride: 97 mEq/L — ABNORMAL LOW (ref 98–109)
Glucose: 101 mg/dl (ref 70–140)
Potassium: 3.8 mEq/L (ref 3.5–5.1)
Sodium: 136 mEq/L (ref 136–145)
TOTAL PROTEIN: 7.1 g/dL (ref 6.4–8.3)

## 2017-07-02 LAB — CBC WITH DIFFERENTIAL/PLATELET
BASO%: 0.2 % (ref 0.0–2.0)
BASOS ABS: 0 10*3/uL (ref 0.0–0.1)
EOS%: 0.9 % (ref 0.0–7.0)
Eosinophils Absolute: 0.1 10*3/uL (ref 0.0–0.5)
HEMATOCRIT: 37.1 % — AB (ref 38.4–49.9)
HEMOGLOBIN: 11.7 g/dL — AB (ref 13.0–17.1)
LYMPH#: 0.9 10*3/uL (ref 0.9–3.3)
LYMPH%: 7.2 % — ABNORMAL LOW (ref 14.0–49.0)
MCH: 28 pg (ref 27.2–33.4)
MCHC: 31.5 g/dL — ABNORMAL LOW (ref 32.0–36.0)
MCV: 88.8 fL (ref 79.3–98.0)
MONO#: 1 10*3/uL — ABNORMAL HIGH (ref 0.1–0.9)
MONO%: 7.2 % (ref 0.0–14.0)
NEUT%: 84.5 % — ABNORMAL HIGH (ref 39.0–75.0)
NEUTROS ABS: 11.1 10*3/uL — AB (ref 1.5–6.5)
Platelets: 376 10*3/uL (ref 140–400)
RBC: 4.18 10*6/uL — ABNORMAL LOW (ref 4.20–5.82)
RDW: 15.6 % — AB (ref 11.0–14.6)
WBC: 13.1 10*3/uL — AB (ref 4.0–10.3)

## 2017-07-02 MED ORDER — ONDANSETRON HCL 4 MG PO TABS
4.0000 mg | ORAL_TABLET | Freq: Once | ORAL | Status: AC
  Administered 2017-07-02: 4 mg via ORAL
  Filled 2017-07-02: qty 1

## 2017-07-02 MED ORDER — ONDANSETRON HCL 4 MG PO TABS: 4.0000 mg | ORAL_TABLET | Freq: Three times a day (TID) | ORAL | 0 refills | Status: AC | PRN

## 2017-07-02 NOTE — Telephone Encounter (Signed)
Refill appropriate 

## 2017-07-03 ENCOUNTER — Encounter: Payer: Self-pay | Admitting: Oncology

## 2017-07-03 ENCOUNTER — Encounter: Payer: Self-pay | Admitting: *Deleted

## 2017-07-03 ENCOUNTER — Ambulatory Visit (HOSPITAL_BASED_OUTPATIENT_CLINIC_OR_DEPARTMENT_OTHER): Payer: Medicare HMO | Admitting: Oncology

## 2017-07-03 ENCOUNTER — Ambulatory Visit (INDEPENDENT_AMBULATORY_CARE_PROVIDER_SITE_OTHER): Payer: Medicare HMO

## 2017-07-03 ENCOUNTER — Telehealth: Payer: Self-pay | Admitting: Oncology

## 2017-07-03 ENCOUNTER — Telehealth: Payer: Self-pay | Admitting: Pharmacist Clinician (PhC)/ Clinical Pharmacy Specialist

## 2017-07-03 ENCOUNTER — Ambulatory Visit
Admission: RE | Admit: 2017-07-03 | Discharge: 2017-07-03 | Disposition: A | Discharge status: Home / Self Care | Payer: Medicare HMO | Source: Ambulatory Visit | Attending: Radiation Oncology | Admitting: Radiation Oncology

## 2017-07-03 ENCOUNTER — Telehealth: Payer: Self-pay | Admitting: Physician Assistant

## 2017-07-03 ENCOUNTER — Other Ambulatory Visit: Payer: Self-pay | Admitting: Oncology

## 2017-07-03 ENCOUNTER — Telehealth: Payer: Self-pay | Admitting: *Deleted

## 2017-07-03 ENCOUNTER — Ambulatory Visit: Payer: Medicare HMO

## 2017-07-03 ENCOUNTER — Other Ambulatory Visit: Payer: Self-pay | Admitting: Internal Medicine

## 2017-07-03 VITALS — BP 102/56 | HR 96 | Temp 97.7°F | Resp 19 | Ht 68.0 in | Wt 214.6 lb

## 2017-07-03 DIAGNOSIS — Z7901 Long term (current) use of anticoagulants: Secondary | ICD-10-CM | POA: Diagnosis not present

## 2017-07-03 DIAGNOSIS — C3432 Malignant neoplasm of lower lobe, left bronchus or lung: Secondary | ICD-10-CM | POA: Diagnosis not present

## 2017-07-03 DIAGNOSIS — Z5181 Encounter for therapeutic drug level monitoring: Secondary | ICD-10-CM | POA: Diagnosis not present

## 2017-07-03 DIAGNOSIS — K219 Gastro-esophageal reflux disease without esophagitis: Secondary | ICD-10-CM | POA: Diagnosis not present

## 2017-07-03 DIAGNOSIS — R05 Cough: Secondary | ICD-10-CM | POA: Diagnosis not present

## 2017-07-03 DIAGNOSIS — I9589 Other hypotension: Secondary | ICD-10-CM | POA: Diagnosis not present

## 2017-07-03 DIAGNOSIS — I4891 Unspecified atrial fibrillation: Secondary | ICD-10-CM

## 2017-07-03 DIAGNOSIS — I10 Essential (primary) hypertension: Secondary | ICD-10-CM | POA: Diagnosis not present

## 2017-07-03 DIAGNOSIS — R918 Other nonspecific abnormal finding of lung field: Secondary | ICD-10-CM | POA: Diagnosis not present

## 2017-07-03 DIAGNOSIS — C4492 Squamous cell carcinoma of skin, unspecified: Secondary | ICD-10-CM | POA: Diagnosis not present

## 2017-07-03 DIAGNOSIS — E1159 Type 2 diabetes mellitus with other circulatory complications: Secondary | ICD-10-CM | POA: Diagnosis not present

## 2017-07-03 DIAGNOSIS — C3492 Malignant neoplasm of unspecified part of left bronchus or lung: Secondary | ICD-10-CM

## 2017-07-03 DIAGNOSIS — J209 Acute bronchitis, unspecified: Secondary | ICD-10-CM | POA: Diagnosis not present

## 2017-07-03 DIAGNOSIS — Z510 Encounter for antineoplastic radiation therapy: Secondary | ICD-10-CM | POA: Diagnosis not present

## 2017-07-03 LAB — PROTIME-INR
INR: >10
Prothrombin Time: >120 s — ABNORMAL HIGH (ref 9.1–12.0)
Prothrombin Time: 87.6 seconds — ABNORMAL HIGH (ref 11.4–15.2)

## 2017-07-03 LAB — POCT INR

## 2017-07-03 MED ORDER — PROCHLORPERAZINE MALEATE 10 MG PO TABS: 10.0000 mg | ORAL_TABLET | Freq: Four times a day (QID) | ORAL | 1 refills | Status: AC | PRN

## 2017-07-03 MED ORDER — SODIUM CHLORIDE 0.9 % IV SOLN
Freq: Once | INTRAVENOUS | Status: AC
  Administered 2017-07-03: 11:00:00 via INTRAVENOUS

## 2017-07-03 MED ORDER — PHYTONADIONE 5 MG PO TABS: 5.0000 mg | ORAL_TABLET | Freq: Once | ORAL | 0 refills | Status: AC

## 2017-07-03 NOTE — Progress Notes (Signed)
Oncology Nurse Navigator Documentation  Oncology Nurse Navigator Flowsheets 07/03/2017  Navigator Location [facility]  Navigator Encounter Type Other/per Dr. Julien Nordmann, I requested PDL 1 to be sent on William Stanley's recent biopsy.   Patient Visit Type MedOnc  Treatment Phase Treatment  Barriers/Navigation Needs Coordination of Care  Interventions Coordination of Care  Coordination of Care Other  Acuity Level 2  Acuity Level 2 Other  Time Spent with Patient 30

## 2017-07-03 NOTE — Patient Instructions (Addendum)
Your blood pressure is low today.  Recommend that you stop taking your amlodipine and benazepril.  Please follow-up with your cardiologist for further management of your blood pressure.   Carboplatin injection What is this medicine? CARBOPLATIN (KAR boe pla tin) is a chemotherapy drug. It targets fast dividing cells, like cancer cells, and causes these cells to die. This medicine is used to treat ovarian cancer and many other cancers. This medicine may be used for other purposes; ask your health care provider or pharmacist if you have questions. COMMON BRAND NAME(S): Paraplatin What should I tell my health care provider before I take this medicine? They need to know if you have any of these conditions: -blood disorders -hearing problems -kidney disease -recent or ongoing radiation therapy -an unusual or allergic reaction to carboplatin, cisplatin, other chemotherapy, other medicines, foods, dyes, or preservatives -pregnant or trying to get pregnant -breast-feeding How should I use this medicine? This drug is usually given as an infusion into a vein. It is administered in a hospital or clinic by a specially trained health care professional. Talk to your pediatrician regarding the use of this medicine in children. Special care may be needed. Overdosage: If you think you have taken too much of this medicine contact a poison control center or emergency room at once. NOTE: This medicine is only for you. Do not share this medicine with others. What if I miss a dose? It is important not to miss a dose. Call your doctor or health care professional if you are unable to keep an appointment. What may interact with this medicine? -medicines for seizures -medicines to increase blood counts like filgrastim, pegfilgrastim, sargramostim -some antibiotics like amikacin, gentamicin, neomycin, streptomycin, tobramycin -vaccines Talk to your doctor or health care professional before taking any of these  medicines: -acetaminophen -aspirin -ibuprofen -ketoprofen -naproxen This list may not describe all possible interactions. Give your health care provider a list of all the medicines, herbs, non-prescription drugs, or dietary supplements you use. Also tell them if you smoke, drink alcohol, or use illegal drugs. Some items may interact with your medicine. What should I watch for while using this medicine? Your condition will be monitored carefully while you are receiving this medicine. You will need important blood work done while you are taking this medicine. This drug may make you feel generally unwell. This is not uncommon, as chemotherapy can affect healthy cells as well as cancer cells. Report any side effects. Continue your course of treatment even though you feel ill unless your doctor tells you to stop. In some cases, you may be given additional medicines to help with side effects. Follow all directions for their use. Call your doctor or health care professional for advice if you get a fever, chills or sore throat, or other symptoms of a cold or flu. Do not treat yourself. This drug decreases your body's ability to fight infections. Try to avoid being around people who are sick. This medicine may increase your risk to bruise or bleed. Call your doctor or health care professional if you notice any unusual bleeding. Be careful brushing and flossing your teeth or using a toothpick because you may get an infection or bleed more easily. If you have any dental work done, tell your dentist you are receiving this medicine. Avoid taking products that contain aspirin, acetaminophen, ibuprofen, naproxen, or ketoprofen unless instructed by your doctor. These medicines may hide a fever. Do not become pregnant while taking this medicine. Women should inform their doctor  if they wish to become pregnant or think they might be pregnant. There is a potential for serious side effects to an unborn child. Talk to  your health care professional or pharmacist for more information. Do not breast-feed an infant while taking this medicine. What side effects may I notice from receiving this medicine? Side effects that you should report to your doctor or health care professional as soon as possible: -allergic reactions like skin rash, itching or hives, swelling of the face, lips, or tongue -signs of infection - fever or chills, cough, sore throat, pain or difficulty passing urine -signs of decreased platelets or bleeding - bruising, pinpoint red spots on the skin, black, tarry stools, nosebleeds -signs of decreased red blood cells - unusually weak or tired, fainting spells, lightheadedness -breathing problems -changes in hearing -changes in vision -chest pain -high blood pressure -low blood counts - This drug may decrease the number of white blood cells, red blood cells and platelets. You may be at increased risk for infections and bleeding. -nausea and vomiting -pain, swelling, redness or irritation at the injection site -pain, tingling, numbness in the hands or feet -problems with balance, talking, walking -trouble passing urine or change in the amount of urine Side effects that usually do not require medical attention (report to your doctor or health care professional if they continue or are bothersome): -hair loss -loss of appetite -metallic taste in the mouth or changes in taste This list may not describe all possible side effects. Call your doctor for medical advice about side effects. You may report side effects to FDA at 1-800-FDA-1088. Where should I keep my medicine? This drug is given in a hospital or clinic and will not be stored at home. NOTE: This sheet is a summary. It may not cover all possible information. If you have questions about this medicine, talk to your doctor, pharmacist, or health care provider.  2018 Elsevier/Gold Standard (2007-10-29 14:38:05)  Paclitaxel injection What is  this medicine? PACLITAXEL (PAK li TAX el) is a chemotherapy drug. It targets fast dividing cells, like cancer cells, and causes these cells to die. This medicine is used to treat ovarian cancer, breast cancer, and other cancers. This medicine may be used for other purposes; ask your health care provider or pharmacist if you have questions. COMMON BRAND NAME(S): Onxol, Taxol What should I tell my health care provider before I take this medicine? They need to know if you have any of these conditions: -blood disorders -irregular heartbeat -infection (especially a virus infection such as chickenpox, cold sores, or herpes) -liver disease -previous or ongoing radiation therapy -an unusual or allergic reaction to paclitaxel, alcohol, polyoxyethylated castor oil, other chemotherapy agents, other medicines, foods, dyes, or preservatives -pregnant or trying to get pregnant -breast-feeding How should I use this medicine? This drug is given as an infusion into a vein. It is administered in a hospital or clinic by a specially trained health care professional. Talk to your pediatrician regarding the use of this medicine in children. Special care may be needed. Overdosage: If you think you have taken too much of this medicine contact a poison control center or emergency room at once. NOTE: This medicine is only for you. Do not share this medicine with others. What if I miss a dose? It is important not to miss your dose. Call your doctor or health care professional if you are unable to keep an appointment. What may interact with this medicine? Do not take this medicine with any  of the following medications: -disulfiram -metronidazole This medicine may also interact with the following medications: -cyclosporine -diazepam -ketoconazole -medicines to increase blood counts like filgrastim, pegfilgrastim, sargramostim -other chemotherapy drugs like cisplatin, doxorubicin, epirubicin, etoposide, teniposide,  vincristine -quinidine -testosterone -vaccines -verapamil Talk to your doctor or health care professional before taking any of these medicines: -acetaminophen -aspirin -ibuprofen -ketoprofen -naproxen This list may not describe all possible interactions. Give your health care provider a list of all the medicines, herbs, non-prescription drugs, or dietary supplements you use. Also tell them if you smoke, drink alcohol, or use illegal drugs. Some items may interact with your medicine. What should I watch for while using this medicine? Your condition will be monitored carefully while you are receiving this medicine. You will need important blood work done while you are taking this medicine. This medicine can cause serious allergic reactions. To reduce your risk you will need to take other medicine(s) before treatment with this medicine. If you experience allergic reactions like skin rash, itching or hives, swelling of the face, lips, or tongue, tell your doctor or health care professional right away. In some cases, you may be given additional medicines to help with side effects. Follow all directions for their use. This drug may make you feel generally unwell. This is not uncommon, as chemotherapy can affect healthy cells as well as cancer cells. Report any side effects. Continue your course of treatment even though you feel ill unless your doctor tells you to stop. Call your doctor or health care professional for advice if you get a fever, chills or sore throat, or other symptoms of a cold or flu. Do not treat yourself. This drug decreases your body's ability to fight infections. Try to avoid being around people who are sick. This medicine may increase your risk to bruise or bleed. Call your doctor or health care professional if you notice any unusual bleeding. Be careful brushing and flossing your teeth or using a toothpick because you may get an infection or bleed more easily. If you have any  dental work done, tell your dentist you are receiving this medicine. Avoid taking products that contain aspirin, acetaminophen, ibuprofen, naproxen, or ketoprofen unless instructed by your doctor. These medicines may hide a fever. Do not become pregnant while taking this medicine. Women should inform their doctor if they wish to become pregnant or think they might be pregnant. There is a potential for serious side effects to an unborn child. Talk to your health care professional or pharmacist for more information. Do not breast-feed an infant while taking this medicine. Men are advised not to father a child while receiving this medicine. This product may contain alcohol. Ask your pharmacist or healthcare provider if this medicine contains alcohol. Be sure to tell all healthcare providers you are taking this medicine. Certain medicines, like metronidazole and disulfiram, can cause an unpleasant reaction when taken with alcohol. The reaction includes flushing, headache, nausea, vomiting, sweating, and increased thirst. The reaction can last from 30 minutes to several hours. What side effects may I notice from receiving this medicine? Side effects that you should report to your doctor or health care professional as soon as possible: -allergic reactions like skin rash, itching or hives, swelling of the face, lips, or tongue -low blood counts - This drug may decrease the number of white blood cells, red blood cells and platelets. You may be at increased risk for infections and bleeding. -signs of infection - fever or chills, cough, sore throat, pain  or difficulty passing urine -signs of decreased platelets or bleeding - bruising, pinpoint red spots on the skin, black, tarry stools, nosebleeds -signs of decreased red blood cells - unusually weak or tired, fainting spells, lightheadedness -breathing problems -chest pain -high or low blood pressure -mouth sores -nausea and vomiting -pain, swelling, redness  or irritation at the injection site -pain, tingling, numbness in the hands or feet -slow or irregular heartbeat -swelling of the ankle, feet, hands Side effects that usually do not require medical attention (report to your doctor or health care professional if they continue or are bothersome): -bone pain -complete hair loss including hair on your head, underarms, pubic hair, eyebrows, and eyelashes -changes in the color of fingernails -diarrhea -loosening of the fingernails -loss of appetite -muscle or joint pain -red flush to skin -sweating This list may not describe all possible side effects. Call your doctor for medical advice about side effects. You may report side effects to FDA at 1-800-FDA-1088. Where should I keep my medicine? This drug is given in a hospital or clinic and will not be stored at home. NOTE: This sheet is a summary. It may not cover all possible information. If you have questions about this medicine, talk to your doctor, pharmacist, or health care provider.  2018 Elsevier/Gold Standard (2015-05-25 19:58:00)  Pembrolizumab injection What is this medicine? PEMBROLIZUMAB (pem broe liz ue mab) is a monoclonal antibody. It is used to treat melanoma, head and neck cancer, Hodgkin lymphoma, non-small cell lung cancer, urothelial cancer, stomach cancer, and cancers that have a certain genetic condition. This medicine may be used for other purposes; ask your health care provider or pharmacist if you have questions. COMMON BRAND NAME(S): Keytruda What should I tell my health care provider before I take this medicine? They need to know if you have any of these conditions: -diabetes -immune system problems -inflammatory bowel disease -liver disease -lung or breathing disease -lupus -organ transplant -an unusual or allergic reaction to pembrolizumab, other medicines, foods, dyes, or preservatives -pregnant or trying to get pregnant -breast-feeding How should I use this  medicine? This medicine is for infusion into a vein. It is given by a health care professional in a hospital or clinic setting. A special MedGuide will be given to you before each treatment. Be sure to read this information carefully each time. Talk to your pediatrician regarding the use of this medicine in children. While this drug may be prescribed for selected conditions, precautions do apply. Overdosage: If you think you have taken too much of this medicine contact a poison control center or emergency room at once. NOTE: This medicine is only for you. Do not share this medicine with others. What if I miss a dose? It is important not to miss your dose. Call your doctor or health care professional if you are unable to keep an appointment. What may interact with this medicine? Interactions have not been studied. Give your health care provider a list of all the medicines, herbs, non-prescription drugs, or dietary supplements you use. Also tell them if you smoke, drink alcohol, or use illegal drugs. Some items may interact with your medicine. This list may not describe all possible interactions. Give your health care provider a list of all the medicines, herbs, non-prescription drugs, or dietary supplements you use. Also tell them if you smoke, drink alcohol, or use illegal drugs. Some items may interact with your medicine. What should I watch for while using this medicine? Your condition will be monitored carefully  while you are receiving this medicine. You may need blood work done while you are taking this medicine. Do not become pregnant while taking this medicine or for 4 months after stopping it. Women should inform their doctor if they wish to become pregnant or think they might be pregnant. There is a potential for serious side effects to an unborn child. Talk to your health care professional or pharmacist for more information. Do not breast-feed an infant while taking this medicine or for 4  months after the last dose. What side effects may I notice from receiving this medicine? Side effects that you should report to your doctor or health care professional as soon as possible: -allergic reactions like skin rash, itching or hives, swelling of the face, lips, or tongue -bloody or black, tarry -breathing problems -changes in vision -chest pain -chills -constipation -cough -dizziness or feeling faint or lightheaded -fast or irregular heartbeat -fever -flushing -hair loss -low blood counts - this medicine may decrease the number of white blood cells, red blood cells and platelets. You may be at increased risk for infections and bleeding. -muscle pain -muscle weakness -persistent headache -signs and symptoms of high blood sugar such as dizziness; dry mouth; dry skin; fruity breath; nausea; stomach pain; increased hunger or thirst; increased urination -signs and symptoms of kidney injury like trouble passing urine or change in the amount of urine -signs and symptoms of liver injury like dark urine, light-colored stools, loss of appetite, nausea, right upper belly pain, yellowing of the eyes or skin -stomach pain -sweating -weight loss Side effects that usually do not require medical attention (report to your doctor or health care professional if they continue or are bothersome): -decreased appetite -diarrhea -tiredness This list may not describe all possible side effects. Call your doctor for medical advice about side effects. You may report side effects to FDA at 1-800-FDA-1088. Where should I keep my medicine? This drug is given in a hospital or clinic and will not be stored at home. NOTE: This sheet is a summary. It may not cover all possible information. If you have questions about this medicine, talk to your doctor, pharmacist, or health care provider.  2018 Elsevier/Gold Standard (2016-05-02 12:29:36)

## 2017-07-03 NOTE — Progress Notes (Signed)
[facility] OFFICE PROGRESS NOTE  [name], PA-C [address] [place] [address]  DIAGNOSIS: Highly suspicious for stage IV (T2b, N2, M0) non-small cell carcinoma, squamous cell, presented with right lower lobe lung mass with associated postobstructive pneumonitis as well as left hilar and subcarinal lymphadenopathy and left pleural effusion.  PRIOR THERAPY: None  CURRENT THERAPY: The patient will begin radiation to the left lower lobe lung mass 07/03/2017.  He will begin systemic chemotherapy with carboplatin for an AUC of 5 and paclitaxel 175 mg/m on 07/16/2017.  INTERVAL HISTORY: William Stanley 71 y.o. male returns for routine follow-up visit accompanied by his neighbor.  The patient continues to have ongoing fatigue and weakness.  He is also having more weight loss.  He reports that he was recently treated for pneumonia with Levaquin by his pulmonologist.  He took his last dose of antibiotic this morning.  He reports dizziness with standing.  No recent falls.  The patient denies fevers and chills.  Denies chest pain, shortness of breath at rest, and hemoptysis.  He does have an ongoing cough with tan sputum production.  The patient has been having some nausea without vomiting.  He was given a prescription for Zofran by radiation oncology yesterday and this is helping.  Denies constipation or diarrhea.  The patient had a recent PET scan and biopsy of his lung lesion and is here to discuss the results.  MEDICAL HISTORY: Past Medical History:  Diagnosis Date  . Atrial fibrillation ([place])   . Diabetes ([place])   . DVT (deep venous thrombosis) ([place])   . Edema of lower extremity   . Hypertension   . Irritable bowel syndrome (IBS)   . Lung collapse    LEFT LUNG, TWICE  . Pulmonary embolism ([facility])     ALLERGIES:  is allergic to fish allergy and penicillins.  MEDICATIONS:  Current Outpatient Medications  Medication Sig Dispense Refill  . acetaminophen  (TYLENOL) 500 MG tablet Take 1,000 mg daily as needed by mouth for moderate pain or headache.    Marland Kitchen amLODipine (NORVASC) 5 MG tablet TAKE 1 TABLET BY MOUTH EVERY DAY (Patient taking differently: Take 5 mgs by mouth once daily at night) 90 tablet 1  . benazepril (LOTENSIN) 10 MG tablet TAKE 1 TABLET BY MOUTH EVERY DAY (Patient taking differently: Take 10 mgs by mouth once daily at night) 90 tablet 1  . bismuth subsalicylate (PEPTO BISMOL) 262 MG/15ML suspension Take 30 mLs every 6 (six) hours as needed by mouth for indigestion.    . cetirizine (ZYRTEC) 10 MG tablet Take 1 tablet (10 mg total) by mouth daily. (Patient taking differently: Take 10 mg at bedtime by mouth. ) 30 tablet 11  . metFORMIN (GLUCOPHAGE) 500 MG tablet TAKE 1 TABLET BY MOUTH TWICE DAILY WITH MEALS 60 tablet 0  . ondansetron (ZOFRAN) 4 MG tablet Take 1 tablet (4 mg total) by mouth every 8 (eight) hours as needed for nausea or vomiting. 30 tablet 0  . pravastatin (PRAVACHOL) 20 MG tablet Take 1 tablet (20 mg total) by mouth daily. (Patient taking differently: Take 20 mg at bedtime by mouth. ) 90 tablet 3  . tamsulosin (FLOMAX) 0.4 MG CAPS capsule Take 1 capsule (0.4 mg total) by mouth daily. (Patient taking differently: Take 0.4 mg at bedtime by mouth. ) 90 capsule 1  . VENTOLIN HFA 108 (90 Base) MCG/ACT inhaler INHALE 1-2 PUFFS INTO LUNGS AS NEEDED (Patient taking differently: Inhale 1 to  2 puffs into the lungs every 6 hours as needed for shortness of breath) 18 g 3  . warfarin (COUMADIN) 5 MG tablet Take 1 to 1 and 1/2 tablets daily as  directed by [facility]. (Patient taking differently: Take 5-7.5 mg See admin instructions by mouth. Take 5 mg by mouth once daily in the evening on Mon, Wed, and Fri, and 7.5 mg once daily on Sun, Tues, Thurs, and Sat) 135 tablet 0  . ACCU-CHEK AVIVA PLUS test strip CHECK BLOOD SUGAR THREE TIMES DAILY 250 each 0  . ACCU-CHEK SOFTCLIX LANCETS lancets CHECK BLOOD SUGAR THREE TIMES DAILY 200 each 0  .  Alcohol Swabs (ALCOHOL PADS) 70 % PADS Use to check blood sugar daily 100 each 11  . blood glucose meter kit and supplies KIT Dispense based on patient and insurance preference. Check blood sugar three times daily  (FOR ICD-10E11.5). 1 each 0  . levofloxacin (LEVAQUIN) 750 MG tablet Take 1 tablet (750 mg total) by mouth daily. 5 tablet 0  . prochlorperazine (COMPAZINE) 10 MG tablet Take 1 tablet (10 mg total) by mouth every 6 (six) hours as needed for nausea or vomiting. 30 tablet 1   Current Facility-Administered Medications  Medication Dose Route Frequency Provider Last Rate Last Dose  . 0.9 %  sodium chloride infusion   Intravenous Once Maryanna Shape, NP 500 mL/hr at 07/03/17 1115      SURGICAL HISTORY:  Past Surgical History:  Procedure Laterality Date  . EYE SURGERY    . HERNIA REPAIR  1992  . IR THORACENTESIS ASP PLEURAL SPACE W/IMG GUIDE  06/18/2017  . VENOUS LIGATION SURGERY  1988  . VIDEO BRONCHOSCOPY Bilateral 06/25/2017   Procedure: VIDEO BRONCHOSCOPY WITH FLUORO;  Surgeon: Juanito Doom, MD;  Location: [facility];  Service: Cardiopulmonary;  Laterality: Bilateral;    REVIEW OF SYSTEMS:   Review of Systems  Constitutional: Negative for chills, fatigue, and fever.  Positive for decreased appetite and weight loss. HENT:   Negative for mouth sores, nosebleeds, sore throat and trouble swallowing.   Eyes: Negative for eye problems and icterus.  Respiratory: Negative for hemoptysis, shortness of breath at rest.  Positive for cough and shortness of breath with exertion. Cardiovascular: Negative for chest pain and leg swelling.  Gastrointestinal: Negative for abdominal pain, constipation, diarrhea, and vomiting. Positive for nausea that has improved with Zofran. Genitourinary: Negative for bladder incontinence, difficulty urinating, dysuria, frequency and hematuria.   Musculoskeletal: Negative for back pain, gait problem, neck pain and neck stiffness.  Skin: Negative for  itching and rash.  Neurological: Negative for extremity weakness, gait problem, headaches, light-headedness and seizures. Positive for dizziness with standing. Hematological: Negative for adenopathy.  Psychiatric/Behavioral: Negative for confusion, depression and sleep disturbance. The patient is not nervous/anxious.     PHYSICAL EXAMINATION:  Blood pressure (!) 77/45, pulse 83, temperature 97.7 F (36.5 C), temperature source Oral, resp. rate 20, height _0  (1.727 m), weight 214 lb 9.6 oz (97.3 kg), SpO2 96 %. Initial blood pressure was 81/48 sitting.  Repeat blood pressure sitting was 72/43 with a pulse of 95.  Blood pressure standing was 67/50 with a pulse of 114. Repeat blood pressure after drinking fluids was 77/45.  ECOG PERFORMANCE STATUS: 2 - Symptomatic, <50% confined to bed  Physical Exam  Constitutional: Oriented to person, place, and time and well-developed, well-nourished, and in no distress. No distress.  HENT:  Head: Normocephalic and atraumatic.  Mouth/Throat: Oropharynx is clear and moist. No oropharyngeal exudate.  Eyes: Conjunctivae are normal. Right eye exhibits no discharge. Left eye exhibits no discharge. No scleral icterus.  Neck: Normal range of motion. Neck supple.  Cardiovascular: Normal rate, regular rhythm, normal heart sounds and intact distal pulses.   Pulmonary/Chest: Effort normal. No respiratory distress. No rales. Wheezes noted to the posterior left lung field and to the right posterior base. Abdominal: Soft. Bowel sounds are normal. Exhibits no distension and no mass. There is no tenderness.  Musculoskeletal: Normal range of motion. Exhibits no edema.  Lymphadenopathy:    No cervical adenopathy.  Neurological: Alert and oriented to person, place, and time. Exhibits normal muscle tone. Coordination normal.  Skin: Skin is warm and dry. No rash noted. Not diaphoretic. No erythema. No pallor.  Psychiatric: Mood, memory and judgment normal.  Vitals  reviewed.  LABORATORY DATA: Lab Results  Component Value Date   WBC 13.1 (H) 07/02/2017   HGB 11.7 (L) 07/02/2017   HCT 37.1 (L) 07/02/2017   MCV 88.8 07/02/2017   PLT 376 07/02/2017      Chemistry      Component Value Date/Time   NA 136 07/02/2017 1043   K 3.8 07/02/2017 1043   CL 98 04/19/2017 1219   CO2 28 07/02/2017 1043   BUN 20.1 07/02/2017 1043   CREATININE 1.0 07/02/2017 1043      Component Value Date/Time   CALCIUM 9.6 07/02/2017 1043   ALKPHOS 129 07/02/2017 1043   AST 69 (H) 07/02/2017 1043   ALT 58 (H) 07/02/2017 1043   BILITOT 0.65 07/02/2017 1043       RADIOGRAPHIC STUDIES:  Dg Chest 1 View  Result Date: 06/18/2017 CLINICAL DATA:  Status post left-sided thoracentesis EXAM: CHEST 1 VIEW COMPARISON:  Chest radiograph May 15, 2017 and chest CT May 17, 2017 FINDINGS: No pneumothorax. There is apical pleural thickening bilaterally which is stable. There is airspace consolidation in the left lower lobe with small residual left pleural effusion. Right lung is clear. Heart is mildly enlarged with pulmonary vascularity within normal limits. No adenopathy. No bone lesions. There is aortic atherosclerosis. IMPRESSION: No pneumothorax. Left lower lobe consolidation, likely pneumonia, with small residual left pleural effusion. Right lung clear. Heart is mildly enlarged. There is aortic atherosclerosis. Aortic Atherosclerosis (ICD10-I70.0). Electronically Signed   By: Lowella Grip III M.D.   On: 06/18/2017 10:35   Mr Jeri Cos ME Contrast  Result Date: 06/19/2017 CLINICAL DATA:  71 year old male with left lower lobe lung mass diagnosed last month. Staging. EXAM: MRI HEAD WITHOUT AND WITH CONTRAST TECHNIQUE: Multiplanar, multiecho pulse sequences of the brain and surrounding structures were obtained without and with intravenous contrast. CONTRAST:  58m MULTIHANCE GADOBENATE DIMEGLUMINE 529 MG/ML IV SOLN COMPARISON:  Chest CT 05/17/2017.  Paranasal sinus CT  10/05/2008. FINDINGS: Brain: No abnormal enhancement identified. No midline shift, mass effect, or evidence of intracranial mass lesion. No dural thickening. No restricted diffusion to suggest acute infarction. No ventriculomegaly, extra-axial collection or acute intracranial hemorrhage. Cervicomedullary junction and pituitary are within normal limits. Small chronic lacunar infarct in the right cerebellum. Patchy central pons and Patchy and confluent scattered bilateral cerebral white matter T2 and FLAIR hyperintensity, moderate for age. No cortical encephalomalacia or chronic cerebral blood products identified. There is only deep gray matter nuclei, primarily the thalami. Mild T2 heterogeneity in the Vascular: Major intracranial vascular flow voids are preserved with dominant distal left vertebral artery and mild generalized intracranial artery tortuosity. Skull and upper cervical spine: Visualized bone marrow signal is within normal limits. Negative  visualized cervical spine. Sinuses/Orbits: Postoperative changes to both globes, otherwise normal orbit soft tissues. Visualized paranasal sinuses and mastoids are stable and well pneumatized. Other: Visible internal auditory structures appear normal. Scalp and face soft tissues appear negative. IMPRESSION: 1.  No metastatic disease or acute intracranial abnormality. 2. Moderate for age nonspecific signal changes in the cerebral white matter and pons are probably chronic small vessel disease related. Small chronic lacune in the right cerebellum. Electronically Signed   By: Genevie Ann M.D.   On: 06/19/2017 11:36   Nm Pet Image Initial (pi) Skull Base To Thigh  Result Date: 06/25/2017 CLINICAL DATA:  Initial treatment strategy for left lower lobe lung mass. EXAM: NUCLEAR MEDICINE PET SKULL BASE TO THIGH TECHNIQUE: 12.5 mCi F-18 FDG was injected intravenously. Full-ring PET imaging was performed from the skull base to thigh after the radiotracer. CT data was obtained  and used for attenuation correction and anatomic localization. FASTING BLOOD GLUCOSE:  Value: 102 mg/dl COMPARISON:  CT chest 05/17/2017 FINDINGS: NECK Nasopharyngeal and left oropharyngeal activity is probably muscular in nature, with bandlike activity along the left upper oropharynx with maximum SUV 7.4. CHEST Large centrally necrotic left lower lobe mass with surrounding postobstructive pneumonitis, maximum SUV 19.1. Left infrahilar lymph node, maximum SUV 21.7. Rim hypermetabolic activity associated with the mass measures approximately 10.2 by 8.3 cm. There is a small left pleural effusion with abnormal metabolic activity along the pleural surface, representative SUV 4.4. High suspicion for malignant pleural effusion. A left paratracheal node on image 49/4 has a maximum SUV of 4.4, above background mediastinal levels, and measures 7 mm in diameter. A subcarinal lymph node measuring 1.1 cm in short axis on image 63/4 has a maximum SUV of 4.4 which is above background mediastinal blood pool levels. Biapical pleuroparenchymal scarring. Atelectasis in the posterior basal segment left lower lobe. Coronary, aortic arch, and branch vessel atherosclerotic vascular disease. Mild cardiomegaly. No rib destruction or masslike invasion of the chest wall is currently identified. ABDOMEN/PELVIS No abnormal hypermetabolic activity within the liver, pancreas, adrenal glands, or spleen. No hypermetabolic lymph nodes in the abdomen or pelvis. Aortoiliac atherosclerotic vascular disease. Infrarenal IVC filter noted. Small nonobstructive right renal calculi blurred by motion artifact. Mildly prominent prostate gland. Direct left inguinal hernia containing adipose tissue extending at the scrotum. SKELETON Borderline accentuated marrow activity diffusely without a discrete focal abnormality to favor osseous metastatic disease. IMPRESSION: 1. 23.5 cm hypermetabolic centrally necrotic left lower lobe mass with surrounding  postobstructive pneumonitis. There is hypermetabolic left infrahilar adenopathy along with small but hypermetabolic subcarinal and left paratracheal lymph nodes. There is also a hypermetabolic rind along the small left pleural effusion parietal pleural margin suspicious for malignant effusion. Assuming non-small lung extensor the appearance would suggest T3 N2 M1a disease (stage IV). 2. No findings of distant metastatic spread to the neck, abdomen/pelvis, or skeleton. 3. There is some bandlike activity in the left upper oropharynx which is probably physiologic. 4. Other imaging findings of potential clinical significance: Aortic Atherosclerosis (ICD10-I70.0). Small nonobstructive right renal calculi. Mildly prominent prostate gland. Direct left inguinal hernia containing adipose tissue. Electronically Signed   By: Van Clines M.D.   On: 06/25/2017 14:32   Ir Thoracentesis Asp Pleural Space W/img Guide  Result Date: 06/18/2017 INDICATION: Left-sided pulmonary nodule with associated pleural effusion. Request for diagnostic and therapeutic thoracentesis. EXAM: ULTRASOUND GUIDED LEFT THORACENTESIS MEDICATIONS: None. COMPLICATIONS: None immediate. Postprocedural chest x-ray negative for pneumothorax. PROCEDURE: An ultrasound guided thoracentesis was thoroughly discussed with the patient  and questions answered. The benefits, risks, alternatives and complications were also discussed. The patient understands and wishes to proceed with the procedure. Written consent was obtained. Ultrasound was performed to localize and mark an adequate pocket of fluid in the left chest. The area was then prepped and draped in the normal sterile fashion. 1% Lidocaine was used for local anesthesia. Under ultrasound guidance a Safe-T-Centesis catheter was introduced. Thoracentesis was performed. The catheter was removed and a dressing applied. FINDINGS: A total of approximately 320 mL of hazy, amber colored fluid was removed.  Samples were sent to the laboratory as requested by the clinical team. IMPRESSION: Successful ultrasound guided left thoracentesis yielding 320 mL of pleural fluid. Read by: Ascencion Dike PA-C Electronically Signed   By: Aletta Edouard M.D.   On: 06/18/2017 11:10   Dg C-arm Bronchoscopy  Result Date: 06/25/2017 C-ARM BRONCHOSCOPY: Fluoroscopy was utilized by the requesting physician.  No radiographic interpretation.   PATHOLOGY:  Diagnosis BRONCHIAL BRUSHING ,LLL (SPECIMEN 1 OF 2 COLLECTED 06/25/17): MALIGNANT CELLS CONSISTENT WITH SQUAMOUS CELL CARCINOMA. Vicente Males MD Pathologist, Electronic Signature (Case signed 06/26/2017)  Diagnosis BRONCHIAL WASHING, LLL (SPECIMEN 2 OF 2 COLLECTED 06/25/17): SUSPICIOUS FOR MALIGNANCY. Vicente Males MD Pathologist, Electronic Signature (Case signed 06/26/2017)  ASSESSMENT/PLAN:  Stage IV squamous cell carcinoma of left lung ([place]) This is a very pleasant 71 year old white male with highly suspicious stage IV non-small cell lung cancer, squamous cell, presented with left lower lobe obstructive lung mass with postobstructive pneumonitis as well as left hilar and mediastinal lymphadenopathy and left pleural effusion.    The patient was seen with Dr. Julien Nordmann.  Biopsy results and PET scan were reviewed with the patient and his friend.  Explained to the patient that he has stage IV disease which is incurable.  We discussed proceeding with palliative radiation to the left lung mass to improve his breathing.  We also discussed proceeding with systemic chemotherapy.  Recommend that he proceed with treatment with carboplatin for an AUC of 5 and paclitaxel 175 mg/m given every 3 weeks. Discussed with the patient adverse effects of this treatment including but not limited to alopecia, myelosuppression, nausea and vomiting, peripheral neuropathy, liver or renal dysfunction.  We also discussed adding immunotherapy with Keytruda after his radiation is complete.  Adverse effects of this treatment were discussed including but not limited to immune mediated the skin rash, diarrhea, inflammation of the lung, kidney, liver, thyroid or other endocrine dysfunction.  Will send tissue for PDL 1 testing.  The patient is in agreement to proceeding with chemotherapy.  We will begin with carboplatin and Taxol initially.  Keytruda to be added after radiation is complete.  He will have a chemotherapy education class prior to starting treatment.  A prescription for Compazine 10 mg every 6 hours as needed for nausea and vomiting was sent to his local pharmacy.  The patient will have weekly labs while he is receiving chemotherapy.  Anticipate beginning chemotherapy on 07/16/2017. He will have a return visit on that date for evaluation prior to cycle #1.  The patient has orthostatic hypotension today.  We discussed increasing his fluid intake.  His pressure did increase with oral fluids, but he was still hypotensive and symptomatic.  He received 1 L of normal saline here in our office today.  I have advised him to stop taking his amlodipine and benazepril.  He was advised to follow-up with his primary care provider regarding further management of his hypertension.  The patient was advised  to call immediately if he has any concerning symptoms in the interval. The patient voices understanding of current disease status and treatment options and is in agreement with the current care plan.  All questions were answered. The patient knows to call the clinic with any problems, questions or concerns. We can certainly see the patient much sooner if necessary.    Orders Placed This Encounter  Procedures  . Comprehensive metabolic panel    Standing Status:   Standing    Number of Occurrences:   20    Standing Expiration Date:   07/03/2018  . CBC with Differential/Platelet    Standing Status:   Standing    Number of Occurrences:   20    Standing Expiration Date:   07/03/2018     William Bussing, DNP, AGPCNP-BC, AOCNP 07/03/17  ADDENDUM: Hematology/Oncology Attending: I had a face-to-face encounter with the patient.  I recommended his care plan.  This is a very pleasant 71 years old white male recently diagnosed with a stage IV non-small cell lung cancer, squamous cell carcinoma presented with large left lower lobe obstructive lung mass in addition to mediastinal lymphadenopathy and highly suspicious malignant left pleural effusion.  He recently underwent bronchoscopy under the care of Dr. Lake Bells and the final pathology was consistent with squamous cell carcinoma.  There was insufficient material for PDL 1 expression testing. I had a lengthy discussion with the patient today about his current disease stage, prognosis and treatment options. I recommended for the patient to proceed with a course of palliative radiotherapy to the obstructive left lung mass. I also discussed with the patient treatment with systemic chemotherapy versus palliative care.  He is interested in treatment.  I recommended for him a regimen of carboplatin for AC of 5 and paclitaxel 175 mg/M2 every 3 weeks.  I would have consider the patient for additional treatment with Keytruda at the same time but because of the current radiotherapy will hold on this option for now. I discussed with the patient the adverse effect of this treatment including but not limited to alopecia, myelosuppression, nausea and vomiting, peripheral neuropathy, liver or renal dysfunction. He is expected to start the first cycle of this treatment on July 16, 2017. The patient will have a chemotherapy education class before the first dose of his treatment. We will also call his pharmacy with prescription for Compazine 10 mg p.o. every 6 hours as needed for nausea. He will come back for follow-up visit in around 3 weeks for evaluation and management of any adverse effect of his treatment. The patient was advised to call  immediately if he has any concerning symptoms in the interval. Disclaimer: This note was dictated with voice recognition software. Similar sounding words can inadvertently be transcribed and may be missed upon review. Eilleen Kempf, MD 07/04/17

## 2017-07-03 NOTE — Telephone Encounter (Signed)
Gave avs and calendar for December - February 2019

## 2017-07-03 NOTE — Progress Notes (Signed)
START ON PATHWAY REGIMEN - Non-Small Cell Lung     A cycle is every 21 days:     Paclitaxel      Carboplatin   **Always confirm dose/schedule in your pharmacy ordering system**    Patient Characteristics: Stage IV Metastatic, Squamous, PS = 0, 1, First Line, PD-L1 Expression Positive 1-49% (TPS) / Negative / Not Tested / Not a Candidate for Immunotherapy/Awaiting Test Results AJCC T Category: T3 Current Disease Status: Distant Metastases AJCC N Category: N2 AJCC M Category: M1a AJCC 8 Stage Grouping: IVA Histology: Squamous Cell Line of therapy: First Line PD-L1 Expression Status: Awaiting Test Results Performance Status: PS = 0, 1 Would you be surprised if this patient died  in the next year<= I would NOT be surprised if this patient died in the next year Intent of Therapy: Non-Curative / Palliative Intent, Discussed with Patient

## 2017-07-03 NOTE — Telephone Encounter (Signed)
Left message for patient to call Vin, the on call APP because of his protime being higher than 120. Dr Stanford Breed also spoke with kristin alvstad pharm md, she is going to get in touch with the patient regarding protime and if needed will send in for patient to get vit k. Also if patient is having problems with bleeding he will need to go to the ER.

## 2017-07-03 NOTE — Progress Notes (Signed)
Nutrition Assessment   Reason for Assessment:   Weight loss, poor appetite  ASSESSMENT:  71 year old male with new diagnosis of lung cancer.  Patient also with postobstructive pneumonitis and left pleural effusion.  Past medical history of afib, DM, HTN, IBS, pulmonary embolism. Noted plan is for chemotherapy and radiation therapy.    Patient seen in symptom management clinic this pm as patient with low blood pressure and receiving IV fluids.  Patient seen with wife present.  Patient reports decreased appetite since "they started doing all these test to figure out what is wrong with me.(10/31)"  Reports frequent coughing spells that keep him from eating and sleeping well at night, as well as shortness of breath and fatigue.  Also reports nausea and vomiting but it sounds like this is associated with coughing spells.  Patient reports that he has been drinking gatorade, eating some pudding, soups lately.  Reports that in September he stopped eating bread and switched to diet soda due to diagnosis of DM.  Has also tried to cut back on sweets.    Reports that he checks blood glucose and has been running 89-119. Takes it 2 times per day.   Nutrition Focused Physical Exam: deferred  Medications: glucophage, coumadin, finished medrol dose pak, zofran  Labs: reviewed  Anthropometrics:   Height: 68 Weight: 214 lb 9.6 oz today UBW: 268 lb in March 2018 BMI: 32  20% weight loss in the last 8 months, significant   Estimated Energy Needs  Kcals: 2100-2400 calories/d Protein: 105-140 g/d Fluid: 2.4 L/d  NUTRITION DIAGNOSIS: Inadequate oral intake related to coughing, shortness of breath, fatigue, lung cancer as evidenced by 20% weight loss in the last 8 months   MALNUTRITION DIAGNOSIS: Patient meets criteria for severe malnutrition with 20% weight loss in the last 8 months, eating < or equal to 75% of energy needs for > or equal to 1 month   INTERVENTION:   Encouraged small frequent  meals especially with shortness of breath. Discussed good sources of protein and soft moist protein foods list provided Encouraged patient to try oral nutrition supplements and if able to drink them recommend 2 per day.  Samples and coupons given today.  Would encouraged higher calorie and protein shakes at this time due to significant weight loss and decreased po intake. Encouraged patient to continue to check blood glucose on regular basis and to notify MD if elevated or running low. Would encouraged patient to liberalize diet at this time with significant weight loss and poor po intake.Fact sheet given on blood glucose management as well. Contact information given    MONITORING, EVALUATION, GOAL: Patient will consume adequate calories and protein to meet nutritional needs and maintain weight   NEXT VISIT: Wednesday, Dec 12 after radiation   Cicily Bonano B. Zenia Resides, [place], [place] Registered Dietitian [phone] (pager)

## 2017-07-03 NOTE — Telephone Encounter (Signed)
Elevated PT/INT. Spoke with patient--> no bleeding issue. The patient will pick up Rx of vitamin K and recheck lab in office tomorrow.

## 2017-07-03 NOTE — Telephone Encounter (Signed)
Lab resulted to Dr. Stanford Breed with PT >120.0.     Called both home and cell numbers for patient - LM for him to call my cell number this evening as soon as he gets the message.  He will need to get Mephyton (vitamin K) 5 mg x 1 tablet to take ASAP.   If not able to get this tonight it would be best for him to go to ER for reversal.   If he has any signs of bleeding he will need to call 911 and go to ER immediately.    Will forward message to [facility] office as well for patient follow up.  6:30 pm - patient returned call.  He reports no signs of bleeding.  Determined which pharmacies are close to his home.  [facility] had Mephyton in stock.  Patient is to pick up rx for 1 tablet and take ASAP.   He will need to follow up with coumadin clinic Wednesday and have repeat venous draw for INR level.  Stressed to patient (and spouse, who was also on phone) that any concerns or signs of bleeding, he will need to call 911.  Patient voiced understanding, will go to pharmacy now.

## 2017-07-03 NOTE — Assessment & Plan Note (Addendum)
This is a very pleasant 71 year old white male with highly suspicious stage IV non-small cell lung cancer, squamous cell, presented with left lower lobe obstructive lung mass with postobstructive pneumonitis as well as left hilar and mediastinal lymphadenopathy and left pleural effusion.    The patient was seen with Dr. Julien Nordmann.  Biopsy results and PET scan were reviewed with the patient and his friend.  Explained to the patient that he has stage IV disease which is incurable.  We discussed proceeding with palliative radiation to the left lung mass to improve his breathing.  We also discussed proceeding with systemic chemotherapy.  Recommend that he proceed with treatment with carboplatin for an AUC of 5 and paclitaxel 175 mg/m given every 3 weeks. Discussed with the patient adverse effects of this treatment including but not limited to alopecia, myelosuppression, nausea and vomiting, peripheral neuropathy, liver or renal dysfunction.  We also discussed adding immunotherapy with Keytruda after his radiation is complete. Adverse effects of this treatment were discussed including but not limited to immune mediated the skin rash, diarrhea, inflammation of the lung, kidney, liver, thyroid or other endocrine dysfunction.  Will send tissue for PDL 1 testing.  The patient is in agreement to proceeding with chemotherapy.  We will begin with carboplatin and Taxol initially.  Keytruda to be added after radiation is complete.  He will have a chemotherapy education class prior to starting treatment.  A prescription for Compazine 10 mg every 6 hours as needed for nausea and vomiting was sent to his local pharmacy.  The patient will have weekly labs while he is receiving chemotherapy.  Anticipate beginning chemotherapy on 07/16/2017. He will have a return visit on that date for evaluation prior to cycle #1.  The patient has orthostatic hypotension today.  We discussed increasing his fluid intake.  His pressure did increase  with oral fluids, but he was still hypotensive and symptomatic.  He received 1 L of normal saline here in our office today.  I have advised him to stop taking his amlodipine and benazepril.  He was advised to follow-up with his primary care provider regarding further management of his hypertension.  The patient was advised to call immediately if he has any concerning symptoms in the interval. The patient voices understanding of current disease status and treatment options and is in agreement with the current care plan.  All questions were answered. The patient knows to call the clinic with any problems, questions or concerns. We can certainly see the patient much sooner if necessary.

## 2017-07-03 NOTE — Progress Notes (Signed)
Pt arrived for scheduled appt with Mikey Bussing, NP. Pt with orthostasis. Mikey Bussing, NP made aware.  Pt receiving 1liter  Normal saline via peripheral IV for low BP. Pt to stop BP meds for now per Mikey Bussing, NP

## 2017-07-04 ENCOUNTER — Ambulatory Visit
Admission: RE | Admit: 2017-07-04 | Discharge: 2017-07-04 | Disposition: A | Discharge status: Home / Self Care | Payer: Medicare HMO | Source: Ambulatory Visit | Attending: Radiation Oncology | Admitting: Radiation Oncology

## 2017-07-04 ENCOUNTER — Other Ambulatory Visit: Payer: Medicare HMO

## 2017-07-04 ENCOUNTER — Ambulatory Visit (INDEPENDENT_AMBULATORY_CARE_PROVIDER_SITE_OTHER)
Admission: RE | Admit: 2017-07-04 | Discharge: 2017-07-04 | Disposition: A | Discharge status: Home / Self Care | Payer: Medicare HMO | Source: Ambulatory Visit | Attending: Pulmonary Disease | Admitting: Pulmonary Disease

## 2017-07-04 ENCOUNTER — Ambulatory Visit: Payer: Medicare HMO | Admitting: Pulmonary Disease

## 2017-07-04 ENCOUNTER — Encounter: Payer: Self-pay | Admitting: Pulmonary Disease

## 2017-07-04 ENCOUNTER — Ambulatory Visit (INDEPENDENT_AMBULATORY_CARE_PROVIDER_SITE_OTHER): Payer: Medicare HMO | Admitting: Pharmacist

## 2017-07-04 VITALS — BP 116/62 | HR 92

## 2017-07-04 DIAGNOSIS — Z5181 Encounter for therapeutic drug level monitoring: Secondary | ICD-10-CM | POA: Diagnosis not present

## 2017-07-04 DIAGNOSIS — R05 Cough: Secondary | ICD-10-CM

## 2017-07-04 DIAGNOSIS — J209 Acute bronchitis, unspecified: Secondary | ICD-10-CM

## 2017-07-04 DIAGNOSIS — K219 Gastro-esophageal reflux disease without esophagitis: Secondary | ICD-10-CM

## 2017-07-04 DIAGNOSIS — I4891 Unspecified atrial fibrillation: Secondary | ICD-10-CM | POA: Diagnosis not present

## 2017-07-04 DIAGNOSIS — C4492 Squamous cell carcinoma of skin, unspecified: Secondary | ICD-10-CM | POA: Diagnosis not present

## 2017-07-04 DIAGNOSIS — C3432 Malignant neoplasm of lower lobe, left bronchus or lung: Secondary | ICD-10-CM | POA: Diagnosis not present

## 2017-07-04 DIAGNOSIS — Z7901 Long term (current) use of anticoagulants: Secondary | ICD-10-CM | POA: Diagnosis not present

## 2017-07-04 DIAGNOSIS — R059 Cough, unspecified: Secondary | ICD-10-CM

## 2017-07-04 DIAGNOSIS — R918 Other nonspecific abnormal finding of lung field: Secondary | ICD-10-CM

## 2017-07-04 DIAGNOSIS — E1159 Type 2 diabetes mellitus with other circulatory complications: Secondary | ICD-10-CM | POA: Diagnosis not present

## 2017-07-04 DIAGNOSIS — I10 Essential (primary) hypertension: Secondary | ICD-10-CM

## 2017-07-04 DIAGNOSIS — Z510 Encounter for antineoplastic radiation therapy: Secondary | ICD-10-CM | POA: Diagnosis not present

## 2017-07-04 DIAGNOSIS — IMO0002 Reserved for concepts with insufficient information to code with codable children: Secondary | ICD-10-CM

## 2017-07-04 LAB — POCT INR: INR: 7.1

## 2017-07-04 MED ORDER — HYDROCOD POLST-CPM POLST ER 10-8 MG/5ML PO SUER: 5.0000 mL | Freq: Two times a day (BID) | ORAL | 0 refills | Status: DC | PRN

## 2017-07-04 MED ORDER — PREDNISONE 20 MG PO TABS: 20.0000 mg | ORAL_TABLET | Freq: Every day | ORAL | 0 refills | Status: DC

## 2017-07-04 NOTE — Progress Notes (Signed)
Subjective:    Patient ID: William Stanley, male    DOB: [birth year], 71 y.o.   MRN: [id number]  Synopsis: Referred in 2018 for left lower lobe mass> diagnosed with stage IV squamous cell carcinoma on bronchoscopy.  HPI Chief Complaint  Patient presents with  . Follow-up    follow up after bronch.  pt c/o sob with exertion, prod cough with pale yellow mucus.    Cough: > feels a tickle in his throat > coughs up yellow to brown mucus > Taking the levaquin > no fever, no chills  Dyspnea: > at baseline, not worse  Lung Cancer: > started radiation treatments at [facility] yesterday > to start carboplatin and paclitaxel  Afib: > started warfarin recently, yesterday his INR was too high, related ot the Levaquin  Past Medical History:  Diagnosis Date  . Atrial fibrillation ([place])   . Diabetes ([place])   . DVT (deep venous thrombosis) ([place])   . Edema of lower extremity   . Hypertension   . Irritable bowel syndrome (IBS)   . Lung collapse    LEFT LUNG, TWICE  . Pulmonary embolism ([place])         Review of Systems  Constitutional: Negative for fever and unexpected weight change.  HENT: Negative for congestion, dental problem, ear pain, nosebleeds, postnasal drip, rhinorrhea, sinus pressure, sneezing, sore throat and trouble swallowing.   Eyes: Negative for redness and itching.  Respiratory: Positive for cough and shortness of breath. Negative for chest tightness and wheezing.   Cardiovascular: Negative for palpitations and leg swelling.  Gastrointestinal: Negative for nausea and vomiting.  Genitourinary: Negative for dysuria.  Musculoskeletal: Negative for joint swelling.  Skin: Negative for rash.  Neurological: Positive for light-headedness and headaches.  Hematological: Does not bruise/bleed easily.  Psychiatric/Behavioral: Negative for dysphoric mood. The patient is not nervous/anxious.        Objective:   Physical Exam  Vitals:   07/04/17 1147  BP: 116/62  Pulse: 92    SpO2: 96%    Gen: chronically ill appearing HENT: OP clear, TM's clear, neck supple PULM: Few crackles left base B, normal percussion CV: RRR, no mgr, trace edema GI: BS+, soft, nontender Derm: no cyanosis or rash Psyche: normal mood and affect   CBC    Component Value Date/Time   WBC 13.1 (H) 07/02/2017 1043   WBC 10.9 (H) 04/19/2017 1218   RBC 4.18 (L) 07/02/2017 1043   RBC 4.29 04/19/2017 1218   HGB 11.7 (L) 07/02/2017 1043   HCT 37.1 (L) 07/02/2017 1043   PLT 376 07/02/2017 1043   MCV 88.8 07/02/2017 1043   MCH 28.0 07/02/2017 1043   MCH 31.0 04/19/2017 1218   MCHC 31.5 (L) 07/02/2017 1043   MCHC 33.3 04/19/2017 1218   RDW 15.6 (H) 07/02/2017 1043   LYMPHSABS 0.9 07/02/2017 1043   MONOABS 1.0 (H) 07/02/2017 1043   EOSABS 0.1 07/02/2017 1043   BASOSABS 0.0 07/02/2017 1043   Chest imaging: 05/17/2017 CT chest images independently reviewed: large left lower lobe mass, left hilar adenopathy noted, 58m subcarinal lymph node  Pathology: November 2018 left lower lobe bronchial brushing positive for squamous cell carcinoma.  Oncology records from yesterday reviewed, they plan to treat him with carboplatin based regimen also with paclitaxel. Phone records reviewed, he underwent treatment with Levaquin a week ago for a cough.     Assessment & Plan:   Cough - Plan: Respiratory or Resp and Sputum Culture, DG Chest 2 View  Lung mass  Squamous cell carcinoma  Hypertension associated with diabetes ([place])  Gastroesophageal reflux disease, esophagitis presence not specified  Acute bronchitis, unspecified organism  William Stanley is really struggling right now.  He has had recurrent episodes of bronchitis despite the recent Levaquin.  Unfortunately the Levaquin interfered with his warfarin.  We need to get a sputum culture to try to figure out what is causing the bronchitis.  We will check a chest x-ray today to make sure that he is not developing worsening signs of pneumonia.   Rather than treat with further antibiotics at this point I would rather follow the results of a culture first.  We will also treat with prednisone given the fact that he just started radiation yesterday and there is likely some inflammation contributing to his cough.  Gastroesophageal reflux disease and his ACE inhibitor are also contributing as he notes a significant tickle in his throat.  We will treat with Tussionex and start treatment for GERD.  Today we talked about his diagnosis, I advised him to continue treating his cancer as directed by the oncology clinic.  Cough: Take Tussionex as needed to help with cough, this will make you sleepy so be careful taking this medicine with anything else that will sedate you.  Do not take this medicine and drive We will get a chest x-ray today to evaluate further  Gastroesophageal reflux disease. Take over-the-counter Prilosec daily Do not eat anything within 3 hours of bedtime  Lung cancer: Keep your follow-up appointments for radiation and eventually chemotherapy. As we discussed today this is stage IV disease which is incurable, if you decide that you would rather go on hospice I am happy to be your hospice doctor  Hypertension: Stop benazepril  Recurrent bronchitis: We will check a sputum culture today Take prednisone 20 mg times the next 5 days  We will see you back in 2-3 weeks or sooner if needed     Current Outpatient Medications:  .  ACCU-CHEK AVIVA PLUS test strip, CHECK BLOOD SUGAR THREE TIMES DAILY, Disp: 250 each, Rfl: 0 .  ACCU-CHEK SOFTCLIX LANCETS lancets, CHECK BLOOD SUGAR THREE TIMES DAILY, Disp: 200 each, Rfl: 0 .  acetaminophen (TYLENOL) 500 MG tablet, Take 1,000 mg daily as needed by mouth for moderate pain or headache., Disp: , Rfl:  .  Alcohol Swabs (ALCOHOL PADS) 70 % PADS, Use to check blood sugar daily, Disp: 100 each, Rfl: 11 .  amLODipine (NORVASC) 5 MG tablet, TAKE 1 TABLET BY MOUTH EVERY DAY (Patient taking  differently: Take 5 mgs by mouth once daily at night), Disp: 90 tablet, Rfl: 1 .  benazepril (LOTENSIN) 10 MG tablet, TAKE 1 TABLET BY MOUTH EVERY DAY (Patient taking differently: Take 10 mgs by mouth once daily at night), Disp: 90 tablet, Rfl: 1 .  bismuth subsalicylate (PEPTO BISMOL) 262 MG/15ML suspension, Take 30 mLs every 6 (six) hours as needed by mouth for indigestion., Disp: , Rfl:  .  blood glucose meter kit and supplies KIT, Dispense based on patient and insurance preference. Check blood sugar three times daily  (FOR ICD-10E11.5)., Disp: 1 each, Rfl: 0 .  cetirizine (ZYRTEC) 10 MG tablet, Take 1 tablet (10 mg total) by mouth daily. (Patient taking differently: Take 10 mg at bedtime by mouth. ), Disp: 30 tablet, Rfl: 11 .  metFORMIN (GLUCOPHAGE) 500 MG tablet, TAKE 1 TABLET BY MOUTH TWICE DAILY WITH MEALS, Disp: 60 tablet, Rfl: 0 .  ondansetron (ZOFRAN) 4 MG tablet, Take 1 tablet (4 mg total) by  mouth every 8 (eight) hours as needed for nausea or vomiting., Disp: 30 tablet, Rfl: 0 .  pravastatin (PRAVACHOL) 20 MG tablet, Take 1 tablet (20 mg total) by mouth daily. (Patient taking differently: Take 20 mg at bedtime by mouth. ), Disp: 90 tablet, Rfl: 3 .  prochlorperazine (COMPAZINE) 10 MG tablet, Take 1 tablet (10 mg total) by mouth every 6 (six) hours as needed for nausea or vomiting., Disp: 30 tablet, Rfl: 1 .  tamsulosin (FLOMAX) 0.4 MG CAPS capsule, Take 1 capsule (0.4 mg total) by mouth daily. (Patient taking differently: Take 0.4 mg at bedtime by mouth. ), Disp: 90 capsule, Rfl: 1 .  VENTOLIN HFA 108 (90 Base) MCG/ACT inhaler, INHALE 1-2 PUFFS INTO LUNGS AS NEEDED (Patient taking differently: Inhale 1 to 2 puffs into the lungs every 6 hours as needed for shortness of breath), Disp: 18 g, Rfl: 3 .  warfarin (COUMADIN) 5 MG tablet, Take 1 to 1 and 1/2 tablets daily as  directed by Coumadin Clinic. (Patient taking differently: Take 5-7.5 mg See admin instructions by mouth. Take 5 mg by mouth  once daily in the evening on Mon, Wed, and Fri, and 7.5 mg once daily on Sun, Tues, Thurs, and Sat), Disp: 135 tablet, Rfl: 0

## 2017-07-04 NOTE — Patient Instructions (Addendum)
Cough: Take Tussionex as needed to help with cough, this will make you sleepy so be careful taking this medicine with anything else that will sedate you.  Do not take this medicine and drive We will get a chest x-ray today to evaluate further  Gastroesophageal reflux disease. Take over-the-counter Prilosec daily Do not eat anything within 3 hours of bedtime  Lung cancer: Keep your follow-up appointments for radiation and eventually chemotherapy. As we discussed today this is stage IV disease which is incurable, if you decide that you would rather go on hospice I am happy to be your hospice doctor  Hypertension: Stop benazepril  Recurrent bronchitis: We will check a sputum culture today Take prednisone 20 mg times the next 5 days  We will see you back in 2-3 weeks or sooner if needed

## 2017-07-04 NOTE — Patient Instructions (Signed)
Hold Coumadin tonight and tomorrow. Recheck Friday. Go to ED with bleeding problems.  Pt having radiation tx this afternoon at hospital. Call [phone] if has to hold coumadin for any procedures or call if placed on any new medications

## 2017-07-05 ENCOUNTER — Ambulatory Visit
Admission: RE | Admit: 2017-07-05 | Discharge: 2017-07-05 | Disposition: A | Discharge status: Home / Self Care | Payer: Medicare HMO | Source: Ambulatory Visit | Attending: Radiation Oncology | Admitting: Radiation Oncology

## 2017-07-05 ENCOUNTER — Encounter: Payer: Medicare HMO | Admitting: Nutrition

## 2017-07-05 ENCOUNTER — Ambulatory Visit: Payer: Medicare HMO | Admitting: Oncology

## 2017-07-05 DIAGNOSIS — Z510 Encounter for antineoplastic radiation therapy: Secondary | ICD-10-CM | POA: Diagnosis not present

## 2017-07-05 DIAGNOSIS — C3432 Malignant neoplasm of lower lobe, left bronchus or lung: Secondary | ICD-10-CM | POA: Diagnosis not present

## 2017-07-05 DIAGNOSIS — R918 Other nonspecific abnormal finding of lung field: Secondary | ICD-10-CM | POA: Diagnosis not present

## 2017-07-06 ENCOUNTER — Ambulatory Visit
Admission: RE | Admit: 2017-07-06 | Discharge: 2017-07-06 | Disposition: A | Discharge status: Home / Self Care | Payer: Medicare HMO | Source: Ambulatory Visit | Attending: Radiation Oncology | Admitting: Radiation Oncology

## 2017-07-06 ENCOUNTER — Ambulatory Visit (INDEPENDENT_AMBULATORY_CARE_PROVIDER_SITE_OTHER): Payer: Medicare HMO | Admitting: *Deleted

## 2017-07-06 DIAGNOSIS — R918 Other nonspecific abnormal finding of lung field: Secondary | ICD-10-CM | POA: Diagnosis not present

## 2017-07-06 DIAGNOSIS — Z5181 Encounter for therapeutic drug level monitoring: Secondary | ICD-10-CM

## 2017-07-06 DIAGNOSIS — Z7901 Long term (current) use of anticoagulants: Secondary | ICD-10-CM | POA: Diagnosis not present

## 2017-07-06 DIAGNOSIS — C3432 Malignant neoplasm of lower lobe, left bronchus or lung: Secondary | ICD-10-CM | POA: Diagnosis not present

## 2017-07-06 DIAGNOSIS — Z510 Encounter for antineoplastic radiation therapy: Secondary | ICD-10-CM | POA: Diagnosis not present

## 2017-07-06 DIAGNOSIS — I4891 Unspecified atrial fibrillation: Secondary | ICD-10-CM

## 2017-07-06 LAB — POCT INR: INR: 3.9

## 2017-07-06 NOTE — Patient Instructions (Addendum)
Do not take coumadin today Nov 30th and take coumadin 2.5mg  on Saturday Dec 1st then continue coumadin 7. 5mg  daily except 5mg  on Mondays Wednesdays and Fridays  Will finish prednisone on Dec 2nd instructed to eat serving of greens each day until finishes Prednisone Call if scheduled for any procedures or if has to hold coumadin for any procedures or  if placed on any new medications  [phone] Recheck in 1 week

## 2017-07-07 LAB — RESPIRATORY CULTURE OR RESPIRATORY AND SPUTUM CULTURE
MICRO NUMBER:: 81336352
SPECIMEN QUALITY: ADEQUATE

## 2017-07-09 ENCOUNTER — Telehealth: Payer: Self-pay | Admitting: Oncology

## 2017-07-09 ENCOUNTER — Other Ambulatory Visit: Payer: Self-pay | Admitting: Pulmonary Disease

## 2017-07-09 ENCOUNTER — Ambulatory Visit: Admission: RE | Admit: 2017-07-09 | Payer: Medicare HMO | Source: Ambulatory Visit

## 2017-07-09 MED ORDER — SULFAMETHOXAZOLE-TRIMETHOPRIM 800-160 MG PO TABS: 1.0000 | ORAL_TABLET | Freq: Two times a day (BID) | ORAL | 0 refills | Status: DC

## 2017-07-09 NOTE — Telephone Encounter (Signed)
Left a message for patient advising him that he already has 2 medications for nausea - compazine and zofran so no additional medications have been sent in to his pharmacy.  Recommended that he take 1 of these medications an hour before treatment tomorrow to see if that helps the nausea.

## 2017-07-09 NOTE — Progress Notes (Signed)
William Stanley

## 2017-07-10 ENCOUNTER — Ambulatory Visit
Admission: RE | Admit: 2017-07-10 | Discharge: 2017-07-10 | Disposition: A | Discharge status: Home / Self Care | Payer: Medicare HMO | Source: Ambulatory Visit | Attending: Radiation Oncology | Admitting: Radiation Oncology

## 2017-07-10 DIAGNOSIS — R918 Other nonspecific abnormal finding of lung field: Secondary | ICD-10-CM | POA: Diagnosis not present

## 2017-07-10 DIAGNOSIS — Z510 Encounter for antineoplastic radiation therapy: Secondary | ICD-10-CM | POA: Diagnosis not present

## 2017-07-10 DIAGNOSIS — C3432 Malignant neoplasm of lower lobe, left bronchus or lung: Secondary | ICD-10-CM | POA: Diagnosis not present

## 2017-07-11 ENCOUNTER — Ambulatory Visit
Admission: RE | Admit: 2017-07-11 | Discharge: 2017-07-11 | Disposition: A | Discharge status: Home / Self Care | Payer: Medicare HMO | Source: Ambulatory Visit | Attending: Radiation Oncology | Admitting: Radiation Oncology

## 2017-07-11 DIAGNOSIS — R918 Other nonspecific abnormal finding of lung field: Secondary | ICD-10-CM | POA: Diagnosis not present

## 2017-07-11 DIAGNOSIS — Z510 Encounter for antineoplastic radiation therapy: Secondary | ICD-10-CM | POA: Diagnosis not present

## 2017-07-11 DIAGNOSIS — C3432 Malignant neoplasm of lower lobe, left bronchus or lung: Secondary | ICD-10-CM | POA: Diagnosis not present

## 2017-07-12 ENCOUNTER — Ambulatory Visit
Admission: RE | Admit: 2017-07-12 | Discharge: 2017-07-12 | Disposition: A | Discharge status: Home / Self Care | Payer: Medicare HMO | Source: Ambulatory Visit | Attending: Radiation Oncology | Admitting: Radiation Oncology

## 2017-07-12 ENCOUNTER — Ambulatory Visit (INDEPENDENT_AMBULATORY_CARE_PROVIDER_SITE_OTHER): Payer: Medicare HMO

## 2017-07-12 ENCOUNTER — Other Ambulatory Visit: Payer: Medicare HMO

## 2017-07-12 ENCOUNTER — Encounter: Payer: Self-pay | Admitting: Internal Medicine

## 2017-07-12 DIAGNOSIS — Z5181 Encounter for therapeutic drug level monitoring: Secondary | ICD-10-CM | POA: Diagnosis not present

## 2017-07-12 DIAGNOSIS — I4891 Unspecified atrial fibrillation: Secondary | ICD-10-CM

## 2017-07-12 DIAGNOSIS — Z510 Encounter for antineoplastic radiation therapy: Secondary | ICD-10-CM | POA: Diagnosis not present

## 2017-07-12 DIAGNOSIS — R918 Other nonspecific abnormal finding of lung field: Secondary | ICD-10-CM | POA: Diagnosis not present

## 2017-07-12 DIAGNOSIS — Z7901 Long term (current) use of anticoagulants: Secondary | ICD-10-CM

## 2017-07-12 DIAGNOSIS — C3432 Malignant neoplasm of lower lobe, left bronchus or lung: Secondary | ICD-10-CM | POA: Diagnosis not present

## 2017-07-12 LAB — POCT INR: INR: 4.8

## 2017-07-12 NOTE — Progress Notes (Signed)
Unfortunately there aren't any foundations offering copay assistance for pt's Dx.  I reached out to [facility] in the radiation department requesting she inform pt of the [place] to assist with gasoline.

## 2017-07-12 NOTE — Patient Instructions (Signed)
Skip today and tomorrow's dosage of Coumadin, then start taking 5mg  daily except 7.5mg  on Tuesdays and Saturdays.   Will completed Bactrim DS on 07/16/17. Call if scheduled for any procedures or if has to hold coumadin for any procedures or  if placed on any new medications  [phone]  Recheck in 1 week.

## 2017-07-13 ENCOUNTER — Other Ambulatory Visit: Payer: Self-pay | Admitting: Radiation Oncology

## 2017-07-13 ENCOUNTER — Ambulatory Visit: Admission: RE | Admit: 2017-07-13 | Payer: Medicare HMO | Source: Ambulatory Visit

## 2017-07-13 MED ORDER — HYDROCODONE-HOMATROPINE 5-1.5 MG/5ML PO SYRP: 5.0000 mL | ORAL_SOLUTION | Freq: Four times a day (QID) | ORAL | 0 refills | Status: DC | PRN

## 2017-07-16 ENCOUNTER — Ambulatory Visit: Payer: Medicare HMO | Admitting: Oncology

## 2017-07-16 ENCOUNTER — Ambulatory Visit: Payer: Medicare HMO

## 2017-07-16 ENCOUNTER — Other Ambulatory Visit: Payer: Medicare HMO

## 2017-07-17 ENCOUNTER — Ambulatory Visit: Payer: Medicare HMO

## 2017-07-17 ENCOUNTER — Telehealth: Payer: Self-pay | Admitting: *Deleted

## 2017-07-17 NOTE — Telephone Encounter (Signed)
PEr MD message to scheduling for pt to f/u in 3 weeks ( 12/31). Message to scheduling.
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
## 2017-07-18 ENCOUNTER — Encounter: Payer: Medicare HMO | Admitting: Nutrition

## 2017-07-18 ENCOUNTER — Ambulatory Visit: Admission: RE | Admit: 2017-07-18 | Payer: Medicare HMO | Source: Ambulatory Visit

## 2017-07-18 ENCOUNTER — Ambulatory Visit: Payer: Medicare HMO

## 2017-07-19 ENCOUNTER — Telehealth: Payer: Self-pay | Admitting: Oncology

## 2017-07-19 ENCOUNTER — Encounter: Payer: Self-pay | Admitting: Oncology

## 2017-07-19 ENCOUNTER — Ambulatory Visit: Admission: RE | Admit: 2017-07-19 | Payer: Medicare HMO | Source: Ambulatory Visit

## 2017-07-19 ENCOUNTER — Other Ambulatory Visit: Payer: Self-pay | Admitting: Radiation Oncology

## 2017-07-19 ENCOUNTER — Ambulatory Visit (INDEPENDENT_AMBULATORY_CARE_PROVIDER_SITE_OTHER): Payer: Medicare HMO | Admitting: *Deleted

## 2017-07-19 DIAGNOSIS — I4891 Unspecified atrial fibrillation: Secondary | ICD-10-CM

## 2017-07-19 DIAGNOSIS — Z5181 Encounter for therapeutic drug level monitoring: Secondary | ICD-10-CM

## 2017-07-19 DIAGNOSIS — Z7901 Long term (current) use of anticoagulants: Secondary | ICD-10-CM | POA: Diagnosis not present

## 2017-07-19 LAB — PROTIME-INR
INR: >10 (ref 0.8–1.2)
Prothrombin Time: 118.8 s — ABNORMAL HIGH (ref 9.1–12.0)

## 2017-07-19 LAB — POCT INR: INR: 8

## 2017-07-19 MED ORDER — HYDROCOD POLST-CPM POLST ER 10-8 MG/5ML PO SUER: 5.0000 mL | Freq: Two times a day (BID) | ORAL | 0 refills | Status: DC | PRN

## 2017-07-19 MED ORDER — PHYTONADIONE 5 MG PO TABS: 5.0000 mg | ORAL_TABLET | Freq: Once | ORAL | 0 refills | Status: AC

## 2017-07-19 MED FILL — HYDROCODONE-CHLORPHENIRAM S: 10-8 | 14 days supply | Qty: 140 | Fill #0

## 2017-07-19 NOTE — Telephone Encounter (Addendum)
Called patient and advised him that we have a prescription for tussionex that needs to be picked up from the Radiation nursing area and then taken over to [facility].  Advised him to take it at about 2 pm before his radiation treatment.  He said he took hycodan this morning at about 8:30 am.  Let him know not to take anymore hycodan today and to take the one dose of Tussionex at 2 pm.  He verbalized understanding.

## 2017-07-20 ENCOUNTER — Encounter: Payer: Self-pay | Admitting: *Deleted

## 2017-07-20 ENCOUNTER — Telehealth: Payer: Self-pay | Admitting: Pharmacist

## 2017-07-20 ENCOUNTER — Encounter: Payer: Self-pay | Admitting: Oncology

## 2017-07-20 ENCOUNTER — Ambulatory Visit: Payer: Medicare HMO

## 2017-07-20 ENCOUNTER — Other Ambulatory Visit (HOSPITAL_BASED_OUTPATIENT_CLINIC_OR_DEPARTMENT_OTHER): Payer: Medicare HMO

## 2017-07-20 ENCOUNTER — Ambulatory Visit (HOSPITAL_BASED_OUTPATIENT_CLINIC_OR_DEPARTMENT_OTHER): Payer: Medicare HMO | Admitting: Oncology

## 2017-07-20 ENCOUNTER — Telehealth: Payer: Self-pay | Admitting: Oncology

## 2017-07-20 ENCOUNTER — Other Ambulatory Visit: Payer: Self-pay | Admitting: Cardiology

## 2017-07-20 VITALS — BP 97/54 | HR 82 | Temp 97.0°F | Resp 16 | Ht 68.0 in | Wt 201.0 lb

## 2017-07-20 DIAGNOSIS — C3432 Malignant neoplasm of lower lobe, left bronchus or lung: Secondary | ICD-10-CM

## 2017-07-20 DIAGNOSIS — Z5181 Encounter for therapeutic drug level monitoring: Secondary | ICD-10-CM | POA: Diagnosis not present

## 2017-07-20 DIAGNOSIS — Z22322 Carrier or suspected carrier of Methicillin resistant Staphylococcus aureus: Secondary | ICD-10-CM

## 2017-07-20 DIAGNOSIS — I4891 Unspecified atrial fibrillation: Secondary | ICD-10-CM

## 2017-07-20 DIAGNOSIS — C3492 Malignant neoplasm of unspecified part of left bronchus or lung: Secondary | ICD-10-CM

## 2017-07-20 DIAGNOSIS — Z5111 Encounter for antineoplastic chemotherapy: Secondary | ICD-10-CM

## 2017-07-20 DIAGNOSIS — R05 Cough: Secondary | ICD-10-CM | POA: Diagnosis not present

## 2017-07-20 LAB — COMPREHENSIVE METABOLIC PANEL
ALBUMIN: 2.1 g/dL — AB (ref 3.5–5.0)
ALK PHOS: 128 U/L (ref 40–150)
ALT: 95 U/L — AB (ref 0–55)
AST: 124 U/L — AB (ref 5–34)
Anion Gap: 13 mEq/L — ABNORMAL HIGH (ref 3–11)
BILIRUBIN TOTAL: 1.17 mg/dL (ref 0.20–1.20)
BUN: 30.1 mg/dL — AB (ref 7.0–26.0)
CALCIUM: 9.4 mg/dL (ref 8.4–10.4)
CO2: 25 mEq/L (ref 22–29)
CREATININE: 0.9 mg/dL (ref 0.7–1.3)
Chloride: 91 mEq/L — ABNORMAL LOW (ref 98–109)
EGFR: >60 mL/min/{1.73_m2} (ref >60)
GLUCOSE: 105 mg/dL (ref 70–140)
Potassium: 4.3 mEq/L (ref 3.5–5.1)
SODIUM: 129 meq/L — AB (ref 136–145)
TOTAL PROTEIN: 7.4 g/dL (ref 6.4–8.3)

## 2017-07-20 LAB — CBC WITH DIFFERENTIAL/PLATELET
BASO%: 0.2 % (ref 0.0–2.0)
BASOS ABS: 0 10*3/uL (ref 0.0–0.1)
EOS%: 0.5 % (ref 0.0–7.0)
Eosinophils Absolute: 0.1 10*3/uL (ref 0.0–0.5)
HEMATOCRIT: 34.3 % — AB (ref 38.4–49.9)
HEMOGLOBIN: 10.8 g/dL — AB (ref 13.0–17.1)
LYMPH#: 0.8 10*3/uL — AB (ref 0.9–3.3)
LYMPH%: 6.5 % — ABNORMAL LOW (ref 14.0–49.0)
MCH: 27.3 pg (ref 27.2–33.4)
MCHC: 31.5 g/dL — ABNORMAL LOW (ref 32.0–36.0)
MCV: 86.6 fL (ref 79.3–98.0)
MONO#: 0.9 10*3/uL (ref 0.1–0.9)
MONO%: 6.7 % (ref 0.0–14.0)
NEUT%: 86.1 % — ABNORMAL HIGH (ref 39.0–75.0)
NEUTROS ABS: 10.9 10*3/uL — AB (ref 1.5–6.5)
Platelets: 368 10*3/uL (ref 140–400)
RBC: 3.96 10*6/uL — ABNORMAL LOW (ref 4.20–5.82)
RDW: 16.3 % — AB (ref 11.0–14.6)
WBC: 12.7 10*3/uL — AB (ref 4.0–10.3)

## 2017-07-20 NOTE — Progress Notes (Signed)
[name] OFFICE PROGRESS NOTE  [name], PA-C [address] [place] [address]  DIAGNOSIS: Highly suspicious for stage IV (T2b,N2, M0) non-small cell carcinoma, squamous cell, presented with right lower lobe lung mass with associated postobstructive pneumonitis as well as left hilar and subcarinal lymphadenopathy and left pleural effusion.  PRIOR THERAPY: None  CURRENT THERAPY: The patient will begin radiation to the left lower lobe lung mass 07/03/2017.  He will begin systemic chemotherapy with carboplatin for an AUC of 5 and paclitaxel 175 mg/m on 07/16/2017. Delayed due to snow.   INTERVAL HISTORY: William Stanley 71 y.o. male returns for a routine follow-up visit accompanied by his neighbor and his sister.  The patient was due to begin chemotherapy earlier this week, but his appointment was delayed due to the snowstorm.  The patient reports that he is having increased cough and can no longer lay flat to receive his radiation.  He has not received radiation since last week.  He was also treated for MRSA in the sputum.  He received a 1 week course of Bactrim.  He has now completed this.  INR has been elevated due to recent antibiotics.  He received vitamin K yesterday.  INR is being followed by cardiology.  He continues to have coughing and is bringing up yellow/green sputum.  He denies fevers and chills.  Denies chest pain and hemoptysis.  He denies nausea, vomiting, constipation, diarrhea.  He continues to have a decreased appetite and has lost more weight.  The patient is here for evaluation prior to cycle 1 of chemotherapy.  MEDICAL HISTORY: Past Medical History:  Diagnosis Date  . Atrial fibrillation ([place])   . Diabetes ([place])   . DVT (deep venous thrombosis) ([place])   . Edema of lower extremity   . Hypertension   . Irritable bowel syndrome (IBS)   . Lung collapse    LEFT LUNG, TWICE  . Pulmonary embolism ([facility])     ALLERGIES:  is allergic to fish  allergy and penicillins.  MEDICATIONS:  Current Outpatient Medications  Medication Sig Dispense Refill  . ACCU-CHEK AVIVA PLUS test strip CHECK BLOOD SUGAR THREE TIMES DAILY 250 each 0  . ACCU-CHEK SOFTCLIX LANCETS lancets CHECK BLOOD SUGAR THREE TIMES DAILY 200 each 0  . acetaminophen (TYLENOL) 500 MG tablet Take 1,000 mg daily as needed by mouth for moderate pain or headache.    . Alcohol Swabs (ALCOHOL PADS) 70 % PADS Use to check blood sugar daily 100 each 11  . amLODipine (NORVASC) 5 MG tablet TAKE 1 TABLET BY MOUTH EVERY DAY (Patient taking differently: Take 5 mgs by mouth once daily at night) 90 tablet 1  . benazepril (LOTENSIN) 10 MG tablet TAKE 1 TABLET BY MOUTH EVERY DAY (Patient taking differently: Take 10 mgs by mouth once daily at night) 90 tablet 1  . bismuth subsalicylate (PEPTO BISMOL) 262 MG/15ML suspension Take 30 mLs every 6 (six) hours as needed by mouth for indigestion.    . blood glucose meter kit and supplies KIT Dispense based on patient and insurance preference. Check blood sugar three times daily  (FOR ICD-10E11.5). 1 each 0  . cetirizine (ZYRTEC) 10 MG tablet Take 1 tablet (10 mg total) by mouth daily. (Patient taking differently: Take 10 mg at bedtime by mouth. ) 30 tablet 11  . chlorpheniramine-HYDROcodone (TUSSIONEX PENNKINETIC ER) 10-8 MG/5ML SUER Take 5 mLs by mouth every 12 (twelve) hours as needed for cough. 140 mL 0  .  HYDROcodone-homatropine (HYCODAN) 5-1.5 MG/5ML syrup Take 5 mLs by mouth every 6 (six) hours as needed for cough. 473 mL 0  . metFORMIN (GLUCOPHAGE) 500 MG tablet TAKE 1 TABLET BY MOUTH TWICE DAILY WITH MEALS 60 tablet 0  . ondansetron (ZOFRAN) 4 MG tablet Take 1 tablet (4 mg total) by mouth every 8 (eight) hours as needed for nausea or vomiting. 30 tablet 0  . pravastatin (PRAVACHOL) 20 MG tablet Take 1 tablet (20 mg total) by mouth daily. (Patient taking differently: Take 20 mg at bedtime by mouth. ) 90 tablet 3  . predniSONE (DELTASONE) 20 MG  tablet Take 1 tablet (20 mg total) by mouth daily with breakfast. 5 tablet 0  . prochlorperazine (COMPAZINE) 10 MG tablet Take 1 tablet (10 mg total) by mouth every 6 (six) hours as needed for nausea or vomiting. 30 tablet 1  . sulfamethoxazole-trimethoprim (BACTRIM DS,SEPTRA DS) 800-160 MG tablet Take 1 tablet by mouth 2 (two) times daily. 14 tablet 0  . tamsulosin (FLOMAX) 0.4 MG CAPS capsule Take 1 capsule (0.4 mg total) by mouth daily. (Patient taking differently: Take 0.4 mg at bedtime by mouth. ) 90 capsule 1  . VENTOLIN HFA 108 (90 Base) MCG/ACT inhaler INHALE 1-2 PUFFS INTO LUNGS AS NEEDED (Patient taking differently: Inhale 1 to 2 puffs into the lungs every 6 hours as needed for shortness of breath) 18 g 3  . warfarin (COUMADIN) 5 MG tablet Take 1 to 1 and 1/2 tablets daily as  directed by [facility]. (Patient taking differently: Take 5-7.5 mg See admin instructions by mouth. Take 5 mg by mouth once daily in the evening on Mon, Wed, and Fri, and 7.5 mg once daily on Sun, Tues, Thurs, and Sat) 135 tablet 0   No current facility-administered medications for this visit.     SURGICAL HISTORY:  Past Surgical History:  Procedure Laterality Date  . EYE SURGERY    . HERNIA REPAIR  1992  . IR THORACENTESIS ASP PLEURAL SPACE W/IMG GUIDE  06/18/2017  . VENOUS LIGATION SURGERY  1988  . VIDEO BRONCHOSCOPY Bilateral 06/25/2017   Procedure: VIDEO BRONCHOSCOPY WITH FLUORO;  Surgeon: Juanito Doom, MD;  Location: [facility];  Service: Cardiopulmonary;  Laterality: Bilateral;    REVIEW OF SYSTEMS:   Review of Systems  Constitutional: Negative for chills, fatigue, fever. Positive for decreased appetite and weight loss. HENT:   Negative for mouth sores, nosebleeds, sore throat and trouble swallowing.   Eyes: Negative for eye problems and icterus.  Respiratory: Negative for hemoptysis, shortness of breath and wheezing.  Positive for productive cough. Cardiovascular: Negative for chest pain  and leg swelling.  Gastrointestinal: Negative for abdominal pain, constipation, diarrhea, nausea and vomiting.  Genitourinary: Negative for bladder incontinence, difficulty urinating, dysuria, frequency and hematuria.   Musculoskeletal: Negative for back pain, neck pain and neck stiffness.  Skin: Negative for itching and rash.  Neurological: Negative for dizziness, extremity weakness, headaches, light-headedness and seizures.  Hematological: Negative for adenopathy. Does not bruise/bleed easily.  Psychiatric/Behavioral: Negative for confusion, depression and sleep disturbance. The patient is not nervous/anxious.     PHYSICAL EXAMINATION:  Blood pressure (!) 97/54, pulse 82, temperature (!) 97 F (36.1 C), temperature source Oral, resp. rate 16, height '5\' 8"'$  (1.727 m), weight 201 lb (91.2 kg), SpO2 100 %.  ECOG PERFORMANCE STATUS: 2 - Symptomatic, <50% confined to bed  Physical Exam  Constitutional: Oriented to person, place, and time and well-developed, well-nourished, and in no distress. No distress.  HENT:  Head: Normocephalic and atraumatic.  Mouth/Throat: Oropharynx is clear and moist. No oropharyngeal exudate.  Eyes: Conjunctivae are normal. Right eye exhibits no discharge. Left eye exhibits no discharge. No scleral icterus.  Neck: Normal range of motion. Neck supple.  Cardiovascular: Normal rate, regular rhythm, normal heart sounds and intact distal pulses.  1+ edema to the bilateral lower extremities. Pulmonary/Chest: Effort normal and breath sounds normal. No respiratory distress. No wheezes. No rales.  Abdominal: Soft. Bowel sounds are normal. Exhibits no distension and no mass. There is no tenderness.  Musculoskeletal: Normal range of motion.  Lymphadenopathy:    No cervical adenopathy.  Neurological: Alert and oriented to person, place, and time. Exhibits normal muscle tone. Coordination normal.  Skin: Skin is warm and dry. No rash noted. Not diaphoretic. No erythema. No  pallor.  Psychiatric: Mood, memory and judgment normal.  Vitals reviewed.  LABORATORY DATA: Lab Results  Component Value Date   WBC 12.7 (H) 07/20/2017   HGB 10.8 (L) 07/20/2017   HCT 34.3 (L) 07/20/2017   MCV 86.6 07/20/2017   PLT 368 07/20/2017      Chemistry      Component Value Date/Time   NA 129 (L) 07/20/2017 0850   K 4.3 07/20/2017 0850   CL 98 04/19/2017 1219   CO2 25 07/20/2017 0850   BUN 30.1 (H) 07/20/2017 0850   CREATININE 0.9 07/20/2017 0850      Component Value Date/Time   CALCIUM 9.4 07/20/2017 0850   ALKPHOS 128 07/20/2017 0850   AST 124 (H) 07/20/2017 0850   ALT 95 (H) 07/20/2017 0850   BILITOT 1.17 07/20/2017 0850       RADIOGRAPHIC STUDIES:  Dg Chest 2 View  Result Date: 07/04/2017 CLINICAL DATA:  Follow-up left pleural effusion. History of left lung cancer. Cough. EXAM: CHEST  2 VIEW COMPARISON:  Chest radiograph 06/18/2017.  PET-CT 06/25/2017 FINDINGS: Densities throughout the left lower chest with air-fluid levels in the left lower lobe. Findings are compatible with necrotic lung tissue. However, the amount of gas within the left lower lobe has increased since the recent PET-CT on 06/25/2017. The left lower lobe disease and lung necrosis has clearly progressed since 06/18/2017. Evidence for a small left pleural effusion and consolidation at the left lung base. Right lung is clear. Stable left apical scarring. Heart size is mildly enlarged but stable. IMPRESSION: Extensive parenchymal disease in left lower lobe and compatible with necrotic lung tissue. There has been progression of this necrotic lung tissue since 06/18/2017. Left basilar densities are compatible with consolidation and small left pleural effusion. Electronically Signed   By: Markus Daft M.D.   On: 07/04/2017 17:17   Nm Pet Image Initial (pi) Skull Base To Thigh  Result Date: 06/25/2017 CLINICAL DATA:  Initial treatment strategy for left lower lobe lung mass. EXAM: NUCLEAR MEDICINE PET  SKULL BASE TO THIGH TECHNIQUE: 12.5 mCi F-18 FDG was injected intravenously. Full-ring PET imaging was performed from the skull base to thigh after the radiotracer. CT data was obtained and used for attenuation correction and anatomic localization. FASTING BLOOD GLUCOSE:  Value: 102 mg/dl COMPARISON:  CT chest 05/17/2017 FINDINGS: NECK Nasopharyngeal and left oropharyngeal activity is probably muscular in nature, with bandlike activity along the left upper oropharynx with maximum SUV 7.4. CHEST Large centrally necrotic left lower lobe mass with surrounding postobstructive pneumonitis, maximum SUV 19.1. Left infrahilar lymph node, maximum SUV 21.7. Rim hypermetabolic activity associated with the mass measures approximately 10.2 by 8.3 cm. There is a small left  pleural effusion with abnormal metabolic activity along the pleural surface, representative SUV 4.4. High suspicion for malignant pleural effusion. A left paratracheal node on image 49/4 has a maximum SUV of 4.4, above background mediastinal levels, and measures 7 mm in diameter. A subcarinal lymph node measuring 1.1 cm in short axis on image 63/4 has a maximum SUV of 4.4 which is above background mediastinal blood pool levels. Biapical pleuroparenchymal scarring. Atelectasis in the posterior basal segment left lower lobe. Coronary, aortic arch, and branch vessel atherosclerotic vascular disease. Mild cardiomegaly. No rib destruction or masslike invasion of the chest wall is currently identified. ABDOMEN/PELVIS No abnormal hypermetabolic activity within the liver, pancreas, adrenal glands, or spleen. No hypermetabolic lymph nodes in the abdomen or pelvis. Aortoiliac atherosclerotic vascular disease. Infrarenal IVC filter noted. Small nonobstructive right renal calculi blurred by motion artifact. Mildly prominent prostate gland. Direct left inguinal hernia containing adipose tissue extending at the scrotum. SKELETON Borderline accentuated marrow activity  diffusely without a discrete focal abnormality to favor osseous metastatic disease. IMPRESSION: 1. 60.1 cm hypermetabolic centrally necrotic left lower lobe mass with surrounding postobstructive pneumonitis. There is hypermetabolic left infrahilar adenopathy along with small but hypermetabolic subcarinal and left paratracheal lymph nodes. There is also a hypermetabolic rind along the small left pleural effusion parietal pleural margin suspicious for malignant effusion. Assuming non-small lung extensor the appearance would suggest T3 N2 M1a disease (stage IV). 2. No findings of distant metastatic spread to the neck, abdomen/pelvis, or skeleton. 3. There is some bandlike activity in the left upper oropharynx which is probably physiologic. 4. Other imaging findings of potential clinical significance: Aortic Atherosclerosis (ICD10-I70.0). Small nonobstructive right renal calculi. Mildly prominent prostate gland. Direct left inguinal hernia containing adipose tissue. Electronically Signed   By: Van Clines M.D.   On: 06/25/2017 14:32   Dg C-arm Bronchoscopy  Result Date: 06/25/2017 C-ARM BRONCHOSCOPY: Fluoroscopy was utilized by the requesting physician.  No radiographic interpretation.     ASSESSMENT/PLAN:  Stage IV squamous cell carcinoma of left lung ([place]) This is a very pleasant 71 year old white male with highly suspicious stage IV non-small cell lung cancer, squamous cell, presented with left lower lobe obstructive lung mass with postobstructive pneumonitis as well as left hilar and mediastinal lymphadenopathy and left pleural effusion.   The patient is currently receiving palliative radiation to his left lower lobe lung mass, but has not received radiation in approximately 1 week because he cannot lay down due to coughing.  The patient is scheduled day to begin systemic chemotherapy with carboplatin for an AUC of 5 and paclitaxel 175 mg/m given every 3 weeks.  Will hold his chemotherapy  today.  Given his recent infection with MRSA and the fact that he is still coughing, we would like him to be cleared from a pulmonology standpoint before proceeding.  He has a follow-up with pulmonology next week.  We will tentatively schedule him back for chemotherapy next week assuming he is cleared from pulmonology standpoint.  The patient will receive weekly labs while on chemotherapy.  He will return for evaluation prior to cycle 2.  The patient was advised to call immediately if he has any concerning symptoms in the interval. The patient voices understanding of current disease status and treatment options and is in agreement with the current care plan.  All questions were answered. The patient knows to call the clinic with any problems, questions or concerns. We can certainly see the patient much sooner if necessary.  No orders of  the defined types were placed in this encounter.   Mikey Bussing, DNP, AGPCNP-BC, AOCNP 07/20/17

## 2017-07-20 NOTE — Telephone Encounter (Signed)
INR was ordered to be drawn today with labs at cancer center. No result in computer. Spoke with patient and wife who report that point of care INR was drawn. They were confused on if the INR was 4.7 or 7.4 or dosing instruction given to patient. Since there is no documentation in the chart on actual INR result unable to dose patient warfarin appropriately today. Patient was given 5mg  of vitamin K yesterday.   Due to INR of >10 yesterday will hold through the weekend since we are unsure if INR was 4.7 or 7.4. Have made appt for patient to be checked in Coumadin clinic on Monday.

## 2017-07-20 NOTE — Assessment & Plan Note (Signed)
This is a very pleasant 71 year old white male with highly suspicious stage IV non-small cell lung cancer, squamous cell, presented with left lower lobe obstructive lung mass with postobstructive pneumonitis as well as left hilar and mediastinal lymphadenopathy and left pleural effusion.   The patient is currently receiving palliative radiation to his left lower lobe lung mass, but has not received radiation in approximately 1 week because he cannot lay down due to coughing.  The patient is scheduled day to begin systemic chemotherapy with carboplatin for an AUC of 5 and paclitaxel 175 mg/m given every 3 weeks.  Will hold his chemotherapy today.  Given his recent infection with MRSA and the fact that he is still coughing, we would like him to be cleared from a pulmonology standpoint before proceeding.  He has a follow-up with pulmonology next week.  We will tentatively schedule him back for chemotherapy next week assuming he is cleared from pulmonology standpoint.  The patient will receive weekly labs while on chemotherapy.  He will return for evaluation prior to cycle 2.  The patient was advised to call immediately if he has any concerning symptoms in the interval. The patient voices understanding of current disease status and treatment options and is in agreement with the current care plan.  All questions were answered. The patient knows to call the clinic with any problems, questions or concerns. We can certainly see the patient much sooner if necessary.

## 2017-07-20 NOTE — Telephone Encounter (Signed)
Gave avs however need to ask about 12/21 -

## 2017-07-23 ENCOUNTER — Ambulatory Visit: Payer: Medicare HMO

## 2017-07-23 ENCOUNTER — Other Ambulatory Visit: Payer: Self-pay | Admitting: Physician Assistant

## 2017-07-23 ENCOUNTER — Other Ambulatory Visit (HOSPITAL_BASED_OUTPATIENT_CLINIC_OR_DEPARTMENT_OTHER): Payer: Medicare HMO

## 2017-07-23 ENCOUNTER — Ambulatory Visit (INDEPENDENT_AMBULATORY_CARE_PROVIDER_SITE_OTHER): Payer: Medicare HMO

## 2017-07-23 DIAGNOSIS — C3492 Malignant neoplasm of unspecified part of left bronchus or lung: Secondary | ICD-10-CM

## 2017-07-23 DIAGNOSIS — Z7901 Long term (current) use of anticoagulants: Secondary | ICD-10-CM | POA: Diagnosis not present

## 2017-07-23 DIAGNOSIS — J988 Other specified respiratory disorders: Principal | ICD-10-CM

## 2017-07-23 DIAGNOSIS — I482 Chronic atrial fibrillation, unspecified: Secondary | ICD-10-CM

## 2017-07-23 DIAGNOSIS — Z5181 Encounter for therapeutic drug level monitoring: Secondary | ICD-10-CM | POA: Diagnosis not present

## 2017-07-23 DIAGNOSIS — B9789 Other viral agents as the cause of diseases classified elsewhere: Secondary | ICD-10-CM

## 2017-07-23 DIAGNOSIS — I4891 Unspecified atrial fibrillation: Secondary | ICD-10-CM | POA: Diagnosis not present

## 2017-07-23 LAB — COMPREHENSIVE METABOLIC PANEL
ALT: 68 U/L — ABNORMAL HIGH (ref 0–55)
ANION GAP: 9 meq/L (ref 3–11)
AST: 70 U/L — ABNORMAL HIGH (ref 5–34)
Albumin: 1.9 g/dL — ABNORMAL LOW (ref 3.5–5.0)
Alkaline Phosphatase: 103 U/L (ref 40–150)
BILIRUBIN TOTAL: 0.57 mg/dL (ref 0.20–1.20)
BUN: 16.5 mg/dL (ref 7.0–26.0)
CHLORIDE: 97 meq/L — AB (ref 98–109)
CO2: 27 meq/L (ref 22–29)
Calcium: 9.1 mg/dL (ref 8.4–10.4)
Creatinine: 0.8 mg/dL (ref 0.7–1.3)
Glucose: 106 mg/dl (ref 70–140)
Potassium: 3.8 mEq/L (ref 3.5–5.1)
Sodium: 133 mEq/L — ABNORMAL LOW (ref 136–145)
Total Protein: 6.9 g/dL (ref 6.4–8.3)

## 2017-07-23 LAB — CBC WITH DIFFERENTIAL/PLATELET
BASO%: 0.4 % (ref 0.0–2.0)
Basophils Absolute: 0 10*3/uL (ref 0.0–0.1)
EOS ABS: 0.1 10*3/uL (ref 0.0–0.5)
EOS%: 1.2 % (ref 0.0–7.0)
HCT: 29.9 % — ABNORMAL LOW (ref 38.4–49.9)
HGB: 9.7 g/dL — ABNORMAL LOW (ref 13.0–17.1)
LYMPH%: 10 % — AB (ref 14.0–49.0)
MCH: 27.6 pg (ref 27.2–33.4)
MCHC: 32.5 g/dL (ref 32.0–36.0)
MCV: 84.9 fL (ref 79.3–98.0)
MONO#: 0.7 10*3/uL (ref 0.1–0.9)
MONO%: 7.5 % (ref 0.0–14.0)
NEUT#: 7.1 10*3/uL — ABNORMAL HIGH (ref 1.5–6.5)
NEUT%: 80.9 % — AB (ref 39.0–75.0)
PLATELETS: 387 10*3/uL (ref 140–400)
RBC: 3.52 10*6/uL — AB (ref 4.20–5.82)
RDW: 16.8 % — ABNORMAL HIGH (ref 11.0–14.6)
WBC: 8.8 10*3/uL (ref 4.0–10.3)
lymph#: 0.9 10*3/uL (ref 0.9–3.3)

## 2017-07-23 LAB — POCT INR: INR: 1.5

## 2017-07-23 LAB — TECHNOLOGIST REVIEW

## 2017-07-23 NOTE — Telephone Encounter (Signed)
Refill appropriate 

## 2017-07-23 NOTE — Patient Instructions (Signed)
Take 1.5 tablets (7.5 mg) today, then continue taking 1 tablet every day except 1.5 tablets on Tuesdays and Saturdays. Took 5 mg Vit K on 12/13. Completed Bactrim DS on 07/16/17. Seek immediate medical attention for any signs of bleeding. Call if scheduled for any procedures or if has to hold coumadin for any procedures or  if placed on any new medications  [phone]  Recheck INR on Friday 12/21.

## 2017-07-24 ENCOUNTER — Ambulatory Visit: Payer: Medicare HMO

## 2017-07-25 ENCOUNTER — Ambulatory Visit: Payer: Medicare HMO

## 2017-07-25 ENCOUNTER — Encounter: Payer: Self-pay | Admitting: Pulmonary Disease

## 2017-07-25 ENCOUNTER — Encounter: Payer: Self-pay | Admitting: Radiation Oncology

## 2017-07-25 ENCOUNTER — Ambulatory Visit: Payer: Medicare HMO | Admitting: Pulmonary Disease

## 2017-07-25 ENCOUNTER — Telehealth: Payer: Self-pay | Admitting: Oncology

## 2017-07-25 ENCOUNTER — Telehealth: Payer: Self-pay | Admitting: Pulmonary Disease

## 2017-07-25 ENCOUNTER — Ambulatory Visit (INDEPENDENT_AMBULATORY_CARE_PROVIDER_SITE_OTHER)
Admission: RE | Admit: 2017-07-25 | Discharge: 2017-07-25 | Disposition: A | Discharge status: Home / Self Care | Payer: Medicare HMO | Source: Ambulatory Visit | Attending: Pulmonary Disease | Admitting: Pulmonary Disease

## 2017-07-25 VITALS — BP 122/76 | HR 87 | Ht 68.0 in | Wt 211.0 lb

## 2017-07-25 DIAGNOSIS — R0602 Shortness of breath: Secondary | ICD-10-CM | POA: Diagnosis not present

## 2017-07-25 DIAGNOSIS — R059 Cough, unspecified: Secondary | ICD-10-CM

## 2017-07-25 DIAGNOSIS — IMO0002 Reserved for concepts with insufficient information to code with codable children: Secondary | ICD-10-CM

## 2017-07-25 DIAGNOSIS — R918 Other nonspecific abnormal finding of lung field: Secondary | ICD-10-CM | POA: Diagnosis not present

## 2017-07-25 DIAGNOSIS — C4492 Squamous cell carcinoma of skin, unspecified: Secondary | ICD-10-CM | POA: Diagnosis not present

## 2017-07-25 DIAGNOSIS — K219 Gastro-esophageal reflux disease without esophagitis: Secondary | ICD-10-CM

## 2017-07-25 DIAGNOSIS — R05 Cough: Secondary | ICD-10-CM

## 2017-07-25 LAB — PROTIME-INR
INR: 4.7 — ABNORMAL HIGH (ref 0.8–1.2)
Prothrombin Time: 45.1 s — ABNORMAL HIGH (ref 9.1–12.0)

## 2017-07-25 MED ORDER — SULFAMETHOXAZOLE-TRIMETHOPRIM 800-160 MG PO TABS: 1.0000 | ORAL_TABLET | Freq: Two times a day (BID) | ORAL | 0 refills | Status: DC

## 2017-07-25 MED ORDER — HYDROCODONE-HOMATROPINE 5-1.5 MG/5ML PO SYRP: 5.0000 mL | ORAL_SOLUTION | Freq: Four times a day (QID) | ORAL | 0 refills | Status: AC | PRN

## 2017-07-25 MED ORDER — PREDNISONE 20 MG PO TABS: 20.0000 mg | ORAL_TABLET | Freq: Every day | ORAL | 0 refills | Status: DC

## 2017-07-25 NOTE — Telephone Encounter (Signed)
We are aware that patient is taking coumadin for his a-fib [facility] and spoke with pharmacist Eliseo Squires - she voiced her understanding  Eliseo Squires would also like BQ to know that patient just received another refill on his Hydromet for #479mL on 12.8.18 - this quantity should last him 3 weeks.  Micheline Maze to place his Rx for today on hold until time for patient to refill.  Will sign and route to BQ as FYI

## 2017-07-25 NOTE — Progress Notes (Signed)
Subjective:    Patient ID: William Stanley, male    DOB: 05/21/1946, 70 y.o.   MRN: [id number]  Synopsis: Referred in 2018 for left lower lobe mass> diagnosed with stage IV squamous cell carcinoma on bronchoscopy.  HPI Chief Complaint  Patient presents with  . Follow-up    3wk rov- pt reports of prod cough with greenish mucus, occ wheezing & sob with exertion.    Records reviewed from oncology 1 week ago.  They put his chemotherapy on hold because of his recent MRSA infection and would like for [name] to evaluate further.  They also have not been able to do much in the way of radiation because of significant cough.  Cough: > he is still coughing up grey mucus > worse when lying flat > he says that he can't get radiation because of this (can't lie flat) > he feels like he is choking when he lay flat and feels like there is mucus in his throat getting hung up > he says that he is coughing up grey-brown, beige > he has to sit up to sleep > he feels like the antibiotic helped, they made his  > he has hycodan that helps with his cough  Afib: > his INR has been elevated since taking antibiotics and he has needed Vit K lately    Past Medical History:  Diagnosis Date  . Atrial fibrillation ([place])   . Diabetes ([place])   . DVT (deep venous thrombosis) ([place])   . Edema of lower extremity   . Hypertension   . Irritable bowel syndrome (IBS)   . Lung collapse    LEFT LUNG, TWICE  . Pulmonary embolism ([place])         Review of Systems  Constitutional: Negative for fever and unexpected weight change.  HENT: Negative for congestion, dental problem, ear pain, nosebleeds, postnasal drip, rhinorrhea, sinus pressure, sneezing, sore throat and trouble swallowing.   Eyes: Negative for redness and itching.  Respiratory: Positive for cough and shortness of breath. Negative for chest tightness and wheezing.   Cardiovascular: Negative for palpitations and leg swelling.  Gastrointestinal: Negative  for nausea and vomiting.  Genitourinary: Negative for dysuria.  Musculoskeletal: Negative for joint swelling.  Skin: Negative for rash.  Neurological: Positive for light-headedness and headaches.  Hematological: Does not bruise/bleed easily.  Psychiatric/Behavioral: Negative for dysphoric mood. The patient is not nervous/anxious.        Objective:   Physical Exam  Vitals:   07/25/17 1113  BP: 122/76  Pulse: 87  SpO2: 92%  Weight: 211 lb (95.7 kg)  Height: 5' 8" (1.727 m)   Gen: chronially ill appearing HENT: OP clear, TM's clear, neck supple PULM: upper lobe and left lung wheezing, normal percussion CV: RRR, no mgr, trace edema GI: BS+, soft, nontender Derm: no cyanosis or rash Psyche: normal mood and affect    CBC    Component Value Date/Time   WBC 8.8 07/23/2017 1153   WBC 10.9 (H) 04/19/2017 1218   RBC 3.52 (L) 07/23/2017 1153   RBC 4.29 04/19/2017 1218   HGB 9.7 (L) 07/23/2017 1153   HCT 29.9 (L) 07/23/2017 1153   PLT 387 07/23/2017 1153   MCV 84.9 07/23/2017 1153   MCH 27.6 07/23/2017 1153   MCH 31.0 04/19/2017 1218   MCHC 32.5 07/23/2017 1153   MCHC 33.3 04/19/2017 1218   RDW 16.8 (H) 07/23/2017 1153   LYMPHSABS 0.9 07/23/2017 1153   MONOABS 0.7 07/23/2017 1153   EOSABS  0.1 07/23/2017 1153   BASOSABS 0.0 07/23/2017 1153   Chest imaging: 05/17/2017 CT chest images independently reviewed: large left lower lobe mass, left hilar adenopathy noted, 42m subcarinal lymph node  Pathology: November 2018 left lower lobe bronchial brushing positive for squamous cell carcinoma.  Oncology records from yesterday reviewed, they plan to treat him with carboplatin based regimen also with paclitaxel. Phone records reviewed, he underwent treatment with Levaquin a week ago for a cough.     Assessment & Plan:   Cough  Lung mass  Squamous cell carcinoma  Gastroesophageal reflux disease, esophagitis presence not specified  Discussion: Mr. WDulworthis feeling  lousy.  Specifically he is coughing still coughing up a lot of mucus.  This is likely ongoing MRSA bronchitis at a minimum.  It is difficult to of say whether or not he has pneumonia with his postobstructive pneumonia.  I really think we need to get him started on radiation treatment as soon as we can.  I worry that acid reflux is contributing to his cough.  Even though we told him to stop taking benazepril last time I worry that he is probably still taking it.  As I do not think that he is doing a very good job with medication reconciliation at home so we talked about this extensively today as most of his cough seems to be due to an upper airway problem.  We will treat him for MRSA bronchitis again, check a chest x-ray, give him a brief course of prednisone, refill his cough medicine, and have him start taking antacid therapy.  Recent MRSA bronchitis, now with acute recurrence: Chest X-ray today to make sure you don't have pneumonia Take bactrim DS twice a day for a week Take prednisone 219mdaily x 5 day  Cough: Use the hycodan as directed We will refill the Hycodan Don't take benazepril  GERD: I think you have reflux Take over the counter prilosec daily  We will see you back in 3-4 weeks or sooner if needed    Current Outpatient Medications:  .  ACCU-CHEK AVIVA PLUS test strip, CHECK BLOOD SUGAR THREE TIMES DAILY, Disp: 250 each, Rfl: 0 .  ACCU-CHEK SOFTCLIX LANCETS lancets, CHECK BLOOD SUGAR THREE TIMES DAILY, Disp: 200 each, Rfl: 0 .  acetaminophen (TYLENOL) 500 MG tablet, Take 1,000 mg daily as needed by mouth for moderate pain or headache., Disp: , Rfl:  .  Alcohol Swabs (ALCOHOL PADS) 70 % PADS, Use to check blood sugar daily, Disp: 100 each, Rfl: 11 .  bismuth subsalicylate (PEPTO BISMOL) 262 MG/15ML suspension, Take 30 mLs every 6 (six) hours as needed by mouth for indigestion., Disp: , Rfl:  .  blood glucose meter kit and supplies KIT, Dispense based on patient and insurance  preference. Check blood sugar three times daily  (FOR ICD-10E11.5)., Disp: 1 each, Rfl: 0 .  cetirizine (ZYRTEC) 10 MG tablet, TAKE 1 TABLET BY MOUTH EVERY DAY, Disp: 30 tablet, Rfl: 0 .  HYDROcodone-homatropine (HYCODAN) 5-1.5 MG/5ML syrup, Take 5 mLs by mouth every 6 (six) hours as needed for cough., Disp: 473 mL, Rfl: 0 .  metFORMIN (GLUCOPHAGE) 500 MG tablet, TAKE 1 TABLET BY MOUTH TWICE DAILY WITH MEALS, Disp: 60 tablet, Rfl: 0 .  ondansetron (ZOFRAN) 4 MG tablet, Take 1 tablet (4 mg total) by mouth every 8 (eight) hours as needed for nausea or vomiting., Disp: 30 tablet, Rfl: 0 .  pravastatin (PRAVACHOL) 20 MG tablet, Take 1 tablet (20 mg total) by mouth daily. (  Patient taking differently: Take 20 mg at bedtime by mouth. ), Disp: 90 tablet, Rfl: 3 .  prochlorperazine (COMPAZINE) 10 MG tablet, Take 1 tablet (10 mg total) by mouth every 6 (six) hours as needed for nausea or vomiting., Disp: 30 tablet, Rfl: 1 .  sulfamethoxazole-trimethoprim (BACTRIM DS,SEPTRA DS) 800-160 MG tablet, Take 1 tablet by mouth 2 (two) times daily., Disp: 14 tablet, Rfl: 0 .  tamsulosin (FLOMAX) 0.4 MG CAPS capsule, Take 1 capsule (0.4 mg total) by mouth daily. (Patient taking differently: Take 0.4 mg at bedtime by mouth. ), Disp: 90 capsule, Rfl: 1 .  VENTOLIN HFA 108 (90 Base) MCG/ACT inhaler, INHALE 1-2 PUFFS INTO LUNGS AS NEEDED (Patient taking differently: Inhale 1 to 2 puffs into the lungs every 6 hours as needed for shortness of breath), Disp: 18 g, Rfl: 3 .  warfarin (COUMADIN) 5 MG tablet, Take 1 to 1 and 1/2 tablets daily as  directed by [facility]. (Patient taking differently: Take 5-7.5 mg See admin instructions by mouth. Take 5 mg by mouth once daily in the evening on Mon, Wed, and Fri, and 7.5 mg once daily on Sun, Tues, Thurs, and Sat), Disp: 135 tablet, Rfl: 0 .  predniSONE (DELTASONE) 20 MG tablet, Take 1 tablet (20 mg total) by mouth daily with breakfast., Disp: 5 tablet, Rfl: 0 .   sulfamethoxazole-trimethoprim (BACTRIM DS,SEPTRA DS) 800-160 MG tablet, Take 1 tablet by mouth 2 (two) times daily., Disp: 14 tablet, Rfl: 0

## 2017-07-25 NOTE — Progress Notes (Signed)
  Radiation Oncology         ([phone]) [phone] ________________________________  Name: William Stanley MRN: [id number]  Date: 07/25/2017  DOB: 1946/02/01  End of Treatment Note  Diagnosis:   left lower lobe lung mass and left pleural effusion     Indication for treatment:  palliative      Radiation treatment dates:   07/03/17-07/12/17  Site/dose:   Left lung, planned 35 Gy total delivered in 14 fractions; stopped early at 17.5 Gy  Beams/energy:   3D / 15X, 10X  Narrative: The patient tolerated radiation treatment relatively well. The patient began treatment with a frequent cough that was very bothersome and kept him awake at night. He was otherwise asymptomatic at the beginning of treatment. Throughout the treatment, his cough progressed and became productive of a greenish/brown sputum. He was treated with Hycodan which helped to provide some relief but he states he would have to sleep sitting up. He also reports a weight loss of about 13 pounds since beginning treatment. He reports fatigue and inability to keep foods down because the cough would cause vomiting.   . Patient's radiation therapy had to be discontinued early because he started coughing as soon as he would lie down despite antibiotic therapy and cough medication.  Plan: Patient will proceed with chemotherapy and will be followed by medical oncology. He is to follow up with radiation oncology PRN.   -----------------------------------  Blair Promise, PhD, MD

## 2017-07-25 NOTE — Telephone Encounter (Signed)
Scheduled appt per 12/18 sch message - patient is aware of appt date and time.

## 2017-07-25 NOTE — Patient Instructions (Signed)
Recent MRSA bronchitis, now with acute recurrence: Chest X-ray today to make sure you don't have pneumonia Take bactrim DS twice a day for a week Take prednisone 20mg  daily x 5 day  Cough: Use the hycodan as directed We will refill the Hycodan Don't take benazepril  GERD: I think you have reflux Take over the counter prilosec daily  We will see you back in 3-4 weeks or sooner if needed

## 2017-07-25 NOTE — Addendum Note (Signed)
Addended by: Maryanna Shape A on: 07/25/2017 11:46 AM   Modules accepted: Orders

## 2017-07-26 ENCOUNTER — Telehealth: Payer: Self-pay | Admitting: Pulmonary Disease

## 2017-07-26 ENCOUNTER — Ambulatory Visit: Payer: Medicare HMO

## 2017-07-26 NOTE — Telephone Encounter (Signed)
Called and spoke to pt. Pt states he would like Tussionex cough syrup instead of Hycodan. Advised pt that both cough medications are very similar and work very similarly. Pt verbalized understanding and states he is fine with keeping the current cough medication. Nothing further needed at this time.

## 2017-07-26 NOTE — Telephone Encounter (Signed)
Yes, I thought we refilled this yesterday.  OK to refill now

## 2017-07-26 NOTE — Telephone Encounter (Signed)
Gave early refill verification to [facility] per below message. Pt is aware and voiced his understanding. Nothing further is needed.

## 2017-07-26 NOTE — Telephone Encounter (Signed)
Pt calling upset that he was unable to fill his hycodan rx.   See 12/19 phone note- pt had 42mL rx filled on 07/14/17 which should last 3 weeks.  I verified with pt that he is only taking it q12h PRN- pt also verified that he still has "some" cough syrup at home, but is afraid of running out.  BQ ok for pt to refill cough syrup rx given at yesterday's OV?

## 2017-07-27 ENCOUNTER — Telehealth: Payer: Self-pay | Admitting: Pulmonary Disease

## 2017-07-27 ENCOUNTER — Ambulatory Visit (HOSPITAL_BASED_OUTPATIENT_CLINIC_OR_DEPARTMENT_OTHER): Payer: Medicare HMO

## 2017-07-27 ENCOUNTER — Other Ambulatory Visit: Payer: Medicare HMO

## 2017-07-27 ENCOUNTER — Ambulatory Visit: Payer: Medicare HMO

## 2017-07-27 ENCOUNTER — Ambulatory Visit (INDEPENDENT_AMBULATORY_CARE_PROVIDER_SITE_OTHER): Payer: Medicare HMO | Admitting: *Deleted

## 2017-07-27 ENCOUNTER — Telehealth: Payer: Self-pay | Admitting: Medical Oncology

## 2017-07-27 VITALS — BP 129/62 | HR 83 | Temp 98.4°F | Resp 18

## 2017-07-27 DIAGNOSIS — Z5111 Encounter for antineoplastic chemotherapy: Secondary | ICD-10-CM

## 2017-07-27 DIAGNOSIS — I4891 Unspecified atrial fibrillation: Secondary | ICD-10-CM

## 2017-07-27 DIAGNOSIS — Z5181 Encounter for therapeutic drug level monitoring: Secondary | ICD-10-CM | POA: Diagnosis not present

## 2017-07-27 DIAGNOSIS — Z7901 Long term (current) use of anticoagulants: Secondary | ICD-10-CM

## 2017-07-27 DIAGNOSIS — C3492 Malignant neoplasm of unspecified part of left bronchus or lung: Secondary | ICD-10-CM

## 2017-07-27 LAB — POCT INR: INR: 3.8

## 2017-07-27 MED ORDER — PALONOSETRON HCL INJECTION 0.25 MG/5ML
INTRAVENOUS | Status: AC
  Filled 2017-07-27: qty 5

## 2017-07-27 MED ORDER — SODIUM CHLORIDE 0.9 % IV SOLN
590.0000 mg | Freq: Once | INTRAVENOUS | Status: AC
  Administered 2017-07-27: 590 mg via INTRAVENOUS
  Filled 2017-07-27: qty 59

## 2017-07-27 MED ORDER — SODIUM CHLORIDE 0.9 % IV SOLN
20.0000 mg | Freq: Once | INTRAVENOUS | Status: AC
  Administered 2017-07-27: 20 mg via INTRAVENOUS
  Filled 2017-07-27: qty 2

## 2017-07-27 MED ORDER — FAMOTIDINE IN NACL 20-0.9 MG/50ML-% IV SOLN
INTRAVENOUS | Status: AC
  Filled 2017-07-27: qty 50

## 2017-07-27 MED ORDER — SODIUM CHLORIDE 0.9 % IV SOLN
Freq: Once | INTRAVENOUS | Status: AC
  Administered 2017-07-27: 09:00:00 via INTRAVENOUS

## 2017-07-27 MED ORDER — PALONOSETRON HCL INJECTION 0.25 MG/5ML
0.2500 mg | Freq: Once | INTRAVENOUS | Status: AC
  Administered 2017-07-27: 0.25 mg via INTRAVENOUS

## 2017-07-27 MED ORDER — PACLITAXEL CHEMO INJECTION 300 MG/50ML
175.0000 mg/m2 | Freq: Once | INTRAVENOUS | Status: AC
  Administered 2017-07-27: 378 mg via INTRAVENOUS
  Filled 2017-07-27: qty 63

## 2017-07-27 MED ORDER — DIPHENHYDRAMINE HCL 50 MG/ML IJ SOLN
50.0000 mg | Freq: Once | INTRAMUSCULAR | Status: AC
Start: 2017-07-27 — End: 2017-07-27
  Administered 2017-07-27: 50 mg via INTRAVENOUS

## 2017-07-27 MED ORDER — FAMOTIDINE IN NACL 20-0.9 MG/50ML-% IV SOLN
20.0000 mg | Freq: Once | INTRAVENOUS | Status: AC
  Administered 2017-07-27: 20 mg via INTRAVENOUS

## 2017-07-27 MED ORDER — DIPHENHYDRAMINE HCL 50 MG/ML IJ SOLN
INTRAMUSCULAR | Status: AC
  Filled 2017-07-27: qty 1

## 2017-07-27 NOTE — Telephone Encounter (Addendum)
I spoke with BQ and he verbalized it was ok for pt to receive treatment. I will try to call nurse again.   Spoke with the nurse and advised recommendations. Will route to BQ for signature.

## 2017-07-27 NOTE — Telephone Encounter (Signed)
Called Dr [place] Bells for clearance to start chemo today. Left message to call back.

## 2017-07-27 NOTE — Telephone Encounter (Signed)
agree

## 2017-07-27 NOTE — Telephone Encounter (Signed)
Called Dr. Julien Nordmann nurse and left message to call back.

## 2017-07-27 NOTE — Patient Instructions (Signed)
[facility] Discharge Instructions for Patients Receiving Chemotherapy  Today you received the following chemotherapy agents Paclitaxel (TAXOL) and Carboplatin (PARAPLATIN).  To help prevent nausea and vomiting after your treatment, we encourage you to take your nausea medication as directed by your doctor.   If you develop nausea and vomiting that is not controlled by your nausea medication, call the clinic.   BELOW ARE SYMPTOMS THAT SHOULD BE REPORTED IMMEDIATELY:  *FEVER GREATER THAN 100.5 F  *CHILLS WITH OR WITHOUT FEVER  NAUSEA AND VOMITING THAT IS NOT CONTROLLED WITH YOUR NAUSEA MEDICATION  *UNUSUAL SHORTNESS OF BREATH  *UNUSUAL BRUISING OR BLEEDING  TENDERNESS IN MOUTH AND THROAT WITH OR WITHOUT PRESENCE OF ULCERS  *URINARY PROBLEMS  *BOWEL PROBLEMS  UNUSUAL RASH Items with * indicate a potential emergency and should be followed up as soon as possible.  Feel free to call the clinic should you have any questions or concerns. The clinic phone number is [phone].  Please show the [place] at check-in to the Emergency Department and triage nurse.  Paclitaxel injection What is this medicine? PACLITAXEL (PAK li TAX el) is a chemotherapy drug. It targets fast dividing cells, like cancer cells, and causes these cells to die. This medicine is used to treat ovarian cancer, breast cancer, and other cancers. This medicine may be used for other purposes; ask your health care provider or pharmacist if you have questions. COMMON BRAND NAME(S): Onxol, Taxol What should I tell my health care provider before I take this medicine? They need to know if you have any of these conditions: -blood disorders -irregular heartbeat -infection (especially a virus infection such as chickenpox, cold sores, or herpes) -liver disease -previous or ongoing radiation therapy -an unusual or allergic reaction to paclitaxel, alcohol, polyoxyethylated castor oil, other  chemotherapy agents, other medicines, foods, dyes, or preservatives -pregnant or trying to get pregnant -breast-feeding How should I use this medicine? This drug is given as an infusion into a vein. It is administered in a hospital or clinic by a specially trained health care professional. Talk to your pediatrician regarding the use of this medicine in children. Special care may be needed. Overdosage: If you think you have taken too much of this medicine contact a poison control center or emergency room at once. NOTE: This medicine is only for you. Do not share this medicine with others. What if I miss a dose? It is important not to miss your dose. Call your doctor or health care professional if you are unable to keep an appointment. What may interact with this medicine? Do not take this medicine with any of the following medications: -disulfiram -metronidazole This medicine may also interact with the following medications: -cyclosporine -diazepam -ketoconazole -medicines to increase blood counts like filgrastim, pegfilgrastim, sargramostim -other chemotherapy drugs like cisplatin, doxorubicin, epirubicin, etoposide, teniposide, vincristine -quinidine -testosterone -vaccines -verapamil Talk to your doctor or health care professional before taking any of these medicines: -acetaminophen -aspirin -ibuprofen -ketoprofen -naproxen This list may not describe all possible interactions. Give your health care provider a list of all the medicines, herbs, non-prescription drugs, or dietary supplements you use. Also tell them if you smoke, drink alcohol, or use illegal drugs. Some items may interact with your medicine. What should I watch for while using this medicine? Your condition will be monitored carefully while you are receiving this medicine. You will need important blood work done while you are taking this medicine. This medicine can cause serious allergic reactions. To reduce  your risk  you will need to take other medicine(s) before treatment with this medicine. If you experience allergic reactions like skin rash, itching or hives, swelling of the face, lips, or tongue, tell your doctor or health care professional right away. In some cases, you may be given additional medicines to help with side effects. Follow all directions for their use. This drug may make you feel generally unwell. This is not uncommon, as chemotherapy can affect healthy cells as well as cancer cells. Report any side effects. Continue your course of treatment even though you feel ill unless your doctor tells you to stop. Call your doctor or health care professional for advice if you get a fever, chills or sore throat, or other symptoms of a cold or flu. Do not treat yourself. This drug decreases your body's ability to fight infections. Try to avoid being around people who are sick. This medicine may increase your risk to bruise or bleed. Call your doctor or health care professional if you notice any unusual bleeding. Be careful brushing and flossing your teeth or using a toothpick because you may get an infection or bleed more easily. If you have any dental work done, tell your dentist you are receiving this medicine. Avoid taking products that contain aspirin, acetaminophen, ibuprofen, naproxen, or ketoprofen unless instructed by your doctor. These medicines may hide a fever. Do not become pregnant while taking this medicine. Women should inform their doctor if they wish to become pregnant or think they might be pregnant. There is a potential for serious side effects to an unborn child. Talk to your health care professional or pharmacist for more information. Do not breast-feed an infant while taking this medicine. Men are advised not to father a child while receiving this medicine. This product may contain alcohol. Ask your pharmacist or healthcare provider if this medicine contains alcohol. Be sure to tell all  healthcare providers you are taking this medicine. Certain medicines, like metronidazole and disulfiram, can cause an unpleasant reaction when taken with alcohol. The reaction includes flushing, headache, nausea, vomiting, sweating, and increased thirst. The reaction can last from 30 minutes to several hours. What side effects may I notice from receiving this medicine? Side effects that you should report to your doctor or health care professional as soon as possible: -allergic reactions like skin rash, itching or hives, swelling of the face, lips, or tongue -low blood counts - This drug may decrease the number of white blood cells, red blood cells and platelets. You may be at increased risk for infections and bleeding. -signs of infection - fever or chills, cough, sore throat, pain or difficulty passing urine -signs of decreased platelets or bleeding - bruising, pinpoint red spots on the skin, black, tarry stools, nosebleeds -signs of decreased red blood cells - unusually weak or tired, fainting spells, lightheadedness -breathing problems -chest pain -high or low blood pressure -mouth sores -nausea and vomiting -pain, swelling, redness or irritation at the injection site -pain, tingling, numbness in the hands or feet -slow or irregular heartbeat -swelling of the ankle, feet, hands Side effects that usually do not require medical attention (report to your doctor or health care professional if they continue or are bothersome): -bone pain -complete hair loss including hair on your head, underarms, pubic hair, eyebrows, and eyelashes -changes in the color of fingernails -diarrhea -loosening of the fingernails -loss of appetite -muscle or joint pain -red flush to skin -sweating This list may not describe all possible side effects. Call  your doctor for medical advice about side effects. You may report side effects to FDA at 1-800-FDA-1088. Where should I keep my medicine? This drug is given in  a hospital or clinic and will not be stored at home. NOTE: This sheet is a summary. It may not cover all possible information. If you have questions about this medicine, talk to your doctor, pharmacist, or health care provider.  2018 Elsevier/Gold Standard (2015-05-25 19:58:00)  Carboplatin injection What is this medicine? CARBOPLATIN (KAR boe pla tin) is a chemotherapy drug. It targets fast dividing cells, like cancer cells, and causes these cells to die. This medicine is used to treat ovarian cancer and many other cancers. This medicine may be used for other purposes; ask your health care provider or pharmacist if you have questions. COMMON BRAND NAME(S): Paraplatin What should I tell my health care provider before I take this medicine? They need to know if you have any of these conditions: -blood disorders -hearing problems -kidney disease -recent or ongoing radiation therapy -an unusual or allergic reaction to carboplatin, cisplatin, other chemotherapy, other medicines, foods, dyes, or preservatives -pregnant or trying to get pregnant -breast-feeding How should I use this medicine? This drug is usually given as an infusion into a vein. It is administered in a hospital or clinic by a specially trained health care professional. Talk to your pediatrician regarding the use of this medicine in children. Special care may be needed. Overdosage: If you think you have taken too much of this medicine contact a poison control center or emergency room at once. NOTE: This medicine is only for you. Do not share this medicine with others. What if I miss a dose? It is important not to miss a dose. Call your doctor or health care professional if you are unable to keep an appointment. What may interact with this medicine? -medicines for seizures -medicines to increase blood counts like filgrastim, pegfilgrastim, sargramostim -some antibiotics like amikacin, gentamicin, neomycin, streptomycin,  tobramycin -vaccines Talk to your doctor or health care professional before taking any of these medicines: -acetaminophen -aspirin -ibuprofen -ketoprofen -naproxen This list may not describe all possible interactions. Give your health care provider a list of all the medicines, herbs, non-prescription drugs, or dietary supplements you use. Also tell them if you smoke, drink alcohol, or use illegal drugs. Some items may interact with your medicine. What should I watch for while using this medicine? Your condition will be monitored carefully while you are receiving this medicine. You will need important blood work done while you are taking this medicine. This drug may make you feel generally unwell. This is not uncommon, as chemotherapy can affect healthy cells as well as cancer cells. Report any side effects. Continue your course of treatment even though you feel ill unless your doctor tells you to stop. In some cases, you may be given additional medicines to help with side effects. Follow all directions for their use. Call your doctor or health care professional for advice if you get a fever, chills or sore throat, or other symptoms of a cold or flu. Do not treat yourself. This drug decreases your body's ability to fight infections. Try to avoid being around people who are sick. This medicine may increase your risk to bruise or bleed. Call your doctor or health care professional if you notice any unusual bleeding. Be careful brushing and flossing your teeth or using a toothpick because you may get an infection or bleed more easily. If you have any dental  work done, tell your dentist you are receiving this medicine. Avoid taking products that contain aspirin, acetaminophen, ibuprofen, naproxen, or ketoprofen unless instructed by your doctor. These medicines may hide a fever. Do not become pregnant while taking this medicine. Women should inform their doctor if they wish to become pregnant or think  they might be pregnant. There is a potential for serious side effects to an unborn child. Talk to your health care professional or pharmacist for more information. Do not breast-feed an infant while taking this medicine. What side effects may I notice from receiving this medicine? Side effects that you should report to your doctor or health care professional as soon as possible: -allergic reactions like skin rash, itching or hives, swelling of the face, lips, or tongue -signs of infection - fever or chills, cough, sore throat, pain or difficulty passing urine -signs of decreased platelets or bleeding - bruising, pinpoint red spots on the skin, black, tarry stools, nosebleeds -signs of decreased red blood cells - unusually weak or tired, fainting spells, lightheadedness -breathing problems -changes in hearing -changes in vision -chest pain -high blood pressure -low blood counts - This drug may decrease the number of white blood cells, red blood cells and platelets. You may be at increased risk for infections and bleeding. -nausea and vomiting -pain, swelling, redness or irritation at the injection site -pain, tingling, numbness in the hands or feet -problems with balance, talking, walking -trouble passing urine or change in the amount of urine Side effects that usually do not require medical attention (report to your doctor or health care professional if they continue or are bothersome): -hair loss -loss of appetite -metallic taste in the mouth or changes in taste This list may not describe all possible side effects. Call your doctor for medical advice about side effects. You may report side effects to FDA at 1-800-FDA-1088. Where should I keep my medicine? This drug is given in a hospital or clinic and will not be stored at home. NOTE: This sheet is a summary. It may not cover all possible information. If you have questions about this medicine, talk to your doctor, pharmacist, or health care  provider.  2018 Elsevier/Gold Standard (2007-10-29 14:38:05)

## 2017-07-27 NOTE — Patient Instructions (Signed)
Description   Do not take any Coumadin today then take 1 tablet daily while on Bactrim. (Normal dose: continue taking 1 tablet every day except 1.5 tablets on Tuesdays and Saturdays). Took 5 mg Vit K on 12/13. Restarted Bactrim DS on 07/25/17. Seek immediate medical attention for any signs of bleeding. Call if scheduled for any procedures or if has to hold coumadin for any procedures or  if placed on any new medications  [phone]

## 2017-07-27 NOTE — Telephone Encounter (Signed)
Dr Lake Bells it is okay to start chemo today and per [facility] may use labs from 07/23/17.

## 2017-07-30 ENCOUNTER — Ambulatory Visit (HOSPITAL_BASED_OUTPATIENT_CLINIC_OR_DEPARTMENT_OTHER): Payer: Medicare HMO

## 2017-07-30 ENCOUNTER — Ambulatory Visit: Payer: Medicare HMO

## 2017-07-30 VITALS — BP 105/64 | HR 107 | Temp 98.5°F | Resp 20

## 2017-07-30 DIAGNOSIS — Z23 Encounter for immunization: Secondary | ICD-10-CM

## 2017-07-30 DIAGNOSIS — C3492 Malignant neoplasm of unspecified part of left bronchus or lung: Secondary | ICD-10-CM | POA: Diagnosis not present

## 2017-07-30 DIAGNOSIS — Z5189 Encounter for other specified aftercare: Secondary | ICD-10-CM | POA: Diagnosis not present

## 2017-07-30 MED ORDER — INFLUENZA VAC SPLIT QUAD 0.5 ML IM SUSY
0.5000 mL | PREFILLED_SYRINGE | Freq: Once | INTRAMUSCULAR | Status: AC
  Administered 2017-07-30: 0.5 mL via INTRAMUSCULAR
  Filled 2017-07-30: qty 0.5

## 2017-07-30 MED ORDER — PEGFILGRASTIM INJECTION 6 MG/0.6ML ~~LOC~~
6.0000 mg | PREFILLED_SYRINGE | Freq: Once | SUBCUTANEOUS | Status: AC
  Administered 2017-07-30: 6 mg via SUBCUTANEOUS

## 2017-07-30 NOTE — Patient Instructions (Addendum)
Pegfilgrastim injection What is this medicine? PEGFILGRASTIM (PEG fil gra stim) is a long-acting granulocyte colony-stimulating factor that stimulates the growth of neutrophils, a type of white blood cell important in the body's fight against infection. It is used to reduce the incidence of fever and infection in patients with certain types of cancer who are receiving chemotherapy that affects the bone marrow, and to increase survival after being exposed to high doses of radiation. This medicine may be used for other purposes; ask your health care provider or pharmacist if you have questions. COMMON BRAND NAME(S): Neulasta What should I tell my health care provider before I take this medicine? They need to know if you have any of these conditions: -kidney disease -latex allergy -ongoing radiation therapy -sickle cell disease -skin reactions to acrylic adhesives (On-Body Injector only) -an unusual or allergic reaction to pegfilgrastim, filgrastim, other medicines, foods, dyes, or preservatives -pregnant or trying to get pregnant -breast-feeding How should I use this medicine? This medicine is for injection under the skin. If you get this medicine at home, you will be taught how to prepare and give the pre-filled syringe or how to use the On-body Injector. Refer to the patient Instructions for Use for detailed instructions. Use exactly as directed. Tell your healthcare provider immediately if you suspect that the On-body Injector may not have performed as intended or if you suspect the use of the On-body Injector resulted in a missed or partial dose. It is important that you put your used needles and syringes in a special sharps container. Do not put them in a trash can. If you do not have a sharps container, call your pharmacist or healthcare provider to get one. Talk to your pediatrician regarding the use of this medicine in children. While this drug may be prescribed for selected conditions,  precautions do apply. Overdosage: If you think you have taken too much of this medicine contact a poison control center or emergency room at once. NOTE: This medicine is only for you. Do not share this medicine with others. What if I miss a dose? It is important not to miss your dose. Call your doctor or health care professional if you miss your dose. If you miss a dose due to an On-body Injector failure or leakage, a new dose should be administered as soon as possible using a single prefilled syringe for manual use. What may interact with this medicine? Interactions have not been studied. Give your health care provider a list of all the medicines, herbs, non-prescription drugs, or dietary supplements you use. Also tell them if you smoke, drink alcohol, or use illegal drugs. Some items may interact with your medicine. This list may not describe all possible interactions. Give your health care provider a list of all the medicines, herbs, non-prescription drugs, or dietary supplements you use. Also tell them if you smoke, drink alcohol, or use illegal drugs. Some items may interact with your medicine. What should I watch for while using this medicine? You may need blood work done while you are taking this medicine. If you are going to need a MRI, CT scan, or other procedure, tell your doctor that you are using this medicine (On-Body Injector only). What side effects may I notice from receiving this medicine? Side effects that you should report to your doctor or health care professional as soon as possible: -allergic reactions like skin rash, itching or hives, swelling of the face, lips, or tongue -dizziness -fever -pain, redness, or irritation at site   where injected -pinpoint red spots on the skin -red or dark-brown urine -shortness of breath or breathing problems -stomach or side pain, or pain at the shoulder -swelling -tiredness -trouble passing urine or change in the amount of urine Side  effects that usually do not require medical attention (report to your doctor or health care professional if they continue or are bothersome): -bone pain -muscle pain This list may not describe all possible side effects. Call your doctor for medical advice about side effects. You may report side effects to FDA at 1-800-FDA-1088. Where should I keep my medicine? Keep out of the reach of children. Store pre-filled syringes in a refrigerator between 2 and 8 degrees C (36 and 46 degrees F). Do not freeze. Keep in carton to protect from light. Throw away this medicine if it is left out of the refrigerator for more than 48 hours. Throw away any unused medicine after the expiration date. NOTE: This sheet is a summary. It may not cover all possible information. If you have questions about this medicine, talk to your doctor, pharmacist, or health care provider.  2018 Elsevier/Gold Standard (2016-07-20 12:58:03)  Influenza Virus Vaccine (Flucelvax) What is this medicine? INFLUENZA VIRUS VACCINE (in floo EN zuh VAHY ruhs vak SEEN) helps to reduce the risk of getting influenza also known as the flu. The vaccine only helps protect you against some strains of the flu. This medicine may be used for other purposes; ask your health care provider or pharmacist if you have questions. COMMON BRAND NAME(S): FLUCELVAX What should I tell my health care provider before I take this medicine? They need to know if you have any of these conditions: -bleeding disorder like hemophilia -fever or infection -Guillain-Barre syndrome or other neurological problems -immune system problems -infection with the human immunodeficiency virus (HIV) or AIDS -low blood platelet counts -multiple sclerosis -an unusual or allergic reaction to influenza virus vaccine, other medicines, foods, dyes or preservatives -pregnant or trying to get pregnant -breast-feeding How should I use this medicine? This vaccine is for injection into a  muscle. It is given by a health care professional. A copy of Vaccine Information Statements will be given before each vaccination. Read this sheet carefully each time. The sheet may change frequently. Talk to your pediatrician regarding the use of this medicine in children. Special care may be needed. Overdosage: If you think you've taken too much of this medicine contact a poison control center or emergency room at once. Overdosage: If you think you have taken too much of this medicine contact a poison control center or emergency room at once. NOTE: This medicine is only for you. Do not share this medicine with others. What if I miss a dose? This does not apply. What may interact with this medicine? -chemotherapy or radiation therapy -medicines that lower your immune system like etanercept, anakinra, infliximab, and adalimumab -medicines that treat or prevent blood clots like warfarin -phenytoin -steroid medicines like prednisone or cortisone -theophylline -vaccines This list may not describe all possible interactions. Give your health care provider a list of all the medicines, herbs, non-prescription drugs, or dietary supplements you use. Also tell them if you smoke, drink alcohol, or use illegal drugs. Some items may interact with your medicine. What should I watch for while using this medicine? Report any side effects that do not go away within 3 days to your doctor or health care professional. Call your health care provider if any unusual symptoms occur within 6 weeks of receiving this  vaccine. You may still catch the flu, but the illness is not usually as bad. You cannot get the flu from the vaccine. The vaccine will not protect against colds or other illnesses that may cause fever. The vaccine is needed every year. What side effects may I notice from receiving this medicine? Side effects that you should report to your doctor or health care professional as soon as possible: -allergic  reactions like skin rash, itching or hives, swelling of the face, lips, or tongue Side effects that usually do not require medical attention (Report these to your doctor or health care professional if they continue or are bothersome.): -fever -headache -muscle aches and pains -pain, tenderness, redness, or swelling at the injection site -tiredness This list may not describe all possible side effects. Call your doctor for medical advice about side effects. You may report side effects to FDA at 1-800-FDA-1088. Where should I keep my medicine? The vaccine will be given by a health care professional in a clinic, pharmacy, doctor's office, or other health care setting. You will not be given vaccine doses to store at home. NOTE: This sheet is a summary. It may not cover all possible information. If you have questions about this medicine, talk to your doctor, pharmacist, or health care provider.  2018 Elsevier/Gold Standard (2011-07-05 14:06:47)

## 2017-08-01 ENCOUNTER — Ambulatory Visit: Payer: Medicare HMO

## 2017-08-01 ENCOUNTER — Ambulatory Visit (INDEPENDENT_AMBULATORY_CARE_PROVIDER_SITE_OTHER): Payer: Medicare HMO | Admitting: *Deleted

## 2017-08-01 DIAGNOSIS — I4891 Unspecified atrial fibrillation: Secondary | ICD-10-CM | POA: Diagnosis not present

## 2017-08-01 DIAGNOSIS — Z5181 Encounter for therapeutic drug level monitoring: Secondary | ICD-10-CM | POA: Diagnosis not present

## 2017-08-01 LAB — POCT INR: INR: 3.8

## 2017-08-01 NOTE — Patient Instructions (Signed)
Description   Skip today's dose, then start taking 1 tablet daily. Recheck in one week.  Call if scheduled for any procedures or if has to hold coumadin for any procedures or  if placed on any new medications  [phone]

## 2017-08-02 ENCOUNTER — Ambulatory Visit: Payer: Medicare HMO

## 2017-08-03 ENCOUNTER — Ambulatory Visit: Payer: Medicare HMO

## 2017-08-03 ENCOUNTER — Ambulatory Visit: Payer: Medicare HMO | Admitting: Oncology

## 2017-08-06 ENCOUNTER — Ambulatory Visit: Payer: Medicare HMO

## 2017-08-06 ENCOUNTER — Encounter: Payer: Medicare HMO | Admitting: Nutrition

## 2017-08-06 ENCOUNTER — Other Ambulatory Visit: Payer: Medicare HMO

## 2017-08-08 ENCOUNTER — Ambulatory Visit: Payer: Medicare HMO

## 2017-08-08 ENCOUNTER — Ambulatory Visit: Payer: Medicare HMO | Admitting: Nurse Practitioner

## 2017-08-08 ENCOUNTER — Other Ambulatory Visit: Payer: Medicare HMO

## 2017-08-09 ENCOUNTER — Telehealth: Payer: Self-pay | Admitting: Oncology

## 2017-08-09 ENCOUNTER — Ambulatory Visit (INDEPENDENT_AMBULATORY_CARE_PROVIDER_SITE_OTHER): Payer: Medicare HMO | Admitting: *Deleted

## 2017-08-09 ENCOUNTER — Ambulatory Visit: Payer: Medicare HMO

## 2017-08-09 DIAGNOSIS — Z5181 Encounter for therapeutic drug level monitoring: Secondary | ICD-10-CM | POA: Diagnosis not present

## 2017-08-09 DIAGNOSIS — I4891 Unspecified atrial fibrillation: Secondary | ICD-10-CM

## 2017-08-09 LAB — PROTIME-INR
INR: 5.8 (ref 0.8–1.2)
PROTHROMBIN TIME: 61.9 s — AB (ref 9.1–12.0)

## 2017-08-09 LAB — POCT INR: INR: 6.3

## 2017-08-09 NOTE — Telephone Encounter (Addendum)
Called patient to see how he is feeling.  He said he was able to have chemotherapy on 07/27/17.  He said he is still coughing a lot but said the Tussionex "calms it down."  He is wondering if he can try Radiation again with taking the Tussionex before treatment.

## 2017-08-09 NOTE — Telephone Encounter (Signed)
Patient called and said he tried laying flat at home like he would for radiation and started coughing after 5 minutes.  Advised him that we should hold off on radiation until he is coughing less and is able to lie flat.  He said he would call back and is hoping that the coughing will get better after his next infusion on 08/20/17.

## 2017-08-10 ENCOUNTER — Ambulatory Visit: Payer: Medicare HMO

## 2017-08-10 ENCOUNTER — Other Ambulatory Visit: Payer: Self-pay | Admitting: Cardiology

## 2017-08-13 ENCOUNTER — Ambulatory Visit: Payer: Medicare HMO

## 2017-08-14 ENCOUNTER — Ambulatory Visit: Payer: Medicare HMO

## 2017-08-15 ENCOUNTER — Ambulatory Visit: Payer: Medicare HMO

## 2017-08-17 ENCOUNTER — Ambulatory Visit (INDEPENDENT_AMBULATORY_CARE_PROVIDER_SITE_OTHER): Payer: Medicare HMO | Admitting: *Deleted

## 2017-08-17 DIAGNOSIS — Z5181 Encounter for therapeutic drug level monitoring: Secondary | ICD-10-CM

## 2017-08-17 DIAGNOSIS — I4891 Unspecified atrial fibrillation: Secondary | ICD-10-CM | POA: Diagnosis not present

## 2017-08-17 LAB — POCT INR: INR: 5.2

## 2017-08-17 NOTE — Patient Instructions (Signed)
Description   Do not take any Coumadin today and No Coumadin tomorrow then start taking 1/2 tablet daily.  Recheck in one week.  Call if scheduled for any procedures or if has to hold coumadin for any procedures or  if placed on any new medications  [phone]

## 2017-08-20 ENCOUNTER — Inpatient Hospital Stay: Payer: Medicare HMO | Attending: Internal Medicine

## 2017-08-20 ENCOUNTER — Other Ambulatory Visit: Payer: Self-pay

## 2017-08-20 ENCOUNTER — Inpatient Hospital Stay (HOSPITAL_BASED_OUTPATIENT_CLINIC_OR_DEPARTMENT_OTHER): Payer: Medicare HMO | Admitting: Oncology

## 2017-08-20 ENCOUNTER — Inpatient Hospital Stay: Payer: Medicare HMO | Admitting: Nutrition

## 2017-08-20 ENCOUNTER — Telehealth: Payer: Self-pay | Admitting: Internal Medicine

## 2017-08-20 ENCOUNTER — Ambulatory Visit (HOSPITAL_BASED_OUTPATIENT_CLINIC_OR_DEPARTMENT_OTHER): Payer: Medicare HMO | Admitting: Medical

## 2017-08-20 ENCOUNTER — Inpatient Hospital Stay: Payer: Medicare HMO

## 2017-08-20 ENCOUNTER — Encounter: Payer: Self-pay | Admitting: Oncology

## 2017-08-20 VITALS — BP 127/65 | HR 84 | Resp 20

## 2017-08-20 VITALS — BP 97/53 | HR 91 | Temp 97.8°F | Resp 20 | Ht 68.0 in | Wt 190.2 lb

## 2017-08-20 DIAGNOSIS — Z923 Personal history of irradiation: Secondary | ICD-10-CM | POA: Diagnosis not present

## 2017-08-20 DIAGNOSIS — R05 Cough: Secondary | ICD-10-CM

## 2017-08-20 DIAGNOSIS — T451X5A Adverse effect of antineoplastic and immunosuppressive drugs, initial encounter: Secondary | ICD-10-CM

## 2017-08-20 DIAGNOSIS — C3432 Malignant neoplasm of lower lobe, left bronchus or lung: Secondary | ICD-10-CM | POA: Diagnosis not present

## 2017-08-20 DIAGNOSIS — R634 Abnormal weight loss: Secondary | ICD-10-CM | POA: Diagnosis not present

## 2017-08-20 DIAGNOSIS — E46 Unspecified protein-calorie malnutrition: Secondary | ICD-10-CM

## 2017-08-20 DIAGNOSIS — Z5111 Encounter for antineoplastic chemotherapy: Secondary | ICD-10-CM | POA: Insufficient documentation

## 2017-08-20 DIAGNOSIS — C3492 Malignant neoplasm of unspecified part of left bronchus or lung: Secondary | ICD-10-CM

## 2017-08-20 LAB — COMPREHENSIVE METABOLIC PANEL
ALT: 25 U/L (ref 0–55)
AST: 27 U/L (ref 5–34)
Albumin: 1.8 g/dL — ABNORMAL LOW (ref 3.5–5.0)
Alkaline Phosphatase: 81 U/L (ref 40–150)
Anion gap: 10 (ref 3–11)
BUN: 11 mg/dL (ref 7–26)
CHLORIDE: 99 mmol/L (ref 98–109)
CO2: 28 mmol/L (ref 22–29)
Calcium: 8.8 mg/dL (ref 8.4–10.4)
Creatinine, Ser: 0.8 mg/dL (ref 0.70–1.30)
GFR calc Af Amer: >60 mL/min (ref >60)
Glucose, Bld: 106 mg/dL (ref 70–140)
POTASSIUM: 3.5 mmol/L (ref 3.5–5.1)
SODIUM: 137 mmol/L (ref 136–145)
Total Bilirubin: 0.6 mg/dL (ref 0.2–1.2)
Total Protein: 6.8 g/dL (ref 6.4–8.3)

## 2017-08-20 LAB — CBC WITH DIFFERENTIAL/PLATELET
Basophils Absolute: 0.1 10*3/uL (ref 0.0–0.1)
Basophils Relative: 1 %
EOS ABS: 0.1 10*3/uL (ref 0.0–0.5)
EOS PCT: 1 %
HCT: 28.2 % — ABNORMAL LOW (ref 38.4–49.9)
HEMOGLOBIN: 9.1 g/dL — AB (ref 13.0–17.1)
LYMPHS ABS: 0.9 10*3/uL (ref 0.9–3.3)
LYMPHS PCT: 9 %
MCH: 27.5 pg (ref 27.2–33.4)
MCHC: 32.2 g/dL (ref 32.0–36.0)
MCV: 85.7 fL (ref 79.3–98.0)
MONOS PCT: 11 %
Monocytes Absolute: 1.1 10*3/uL — ABNORMAL HIGH (ref 0.1–0.9)
NEUTROS PCT: 78 %
Neutro Abs: 7.9 10*3/uL — ABNORMAL HIGH (ref 1.5–6.5)
Platelets: 646 10*3/uL — ABNORMAL HIGH (ref 140–400)
RBC: 3.3 MIL/uL — ABNORMAL LOW (ref 4.20–5.82)
RDW: 19.7 % — ABNORMAL HIGH (ref 11.0–15.6)
WBC: 10 10*3/uL (ref 4.0–10.3)

## 2017-08-20 MED ORDER — SODIUM CHLORIDE 0.9 % IV SOLN
Freq: Once | INTRAVENOUS | Status: AC | PRN
  Administered 2017-08-20: 14:00:00 via INTRAVENOUS

## 2017-08-20 MED ORDER — DIPHENHYDRAMINE HCL 50 MG/ML IJ SOLN
INTRAMUSCULAR | Status: AC
  Filled 2017-08-20: qty 1

## 2017-08-20 MED ORDER — DIPHENHYDRAMINE HCL 50 MG/ML IJ SOLN
50.0000 mg | Freq: Once | INTRAMUSCULAR | Status: AC
  Administered 2017-08-20: 50 mg via INTRAVENOUS

## 2017-08-20 MED ORDER — FAMOTIDINE IN NACL 20-0.9 MG/50ML-% IV SOLN
20.0000 mg | Freq: Once | INTRAVENOUS | Status: AC
  Administered 2017-08-20: 20 mg via INTRAVENOUS

## 2017-08-20 MED ORDER — ALBUTEROL SULFATE (2.5 MG/3ML) 0.083% IN NEBU
INHALATION_SOLUTION | RESPIRATORY_TRACT | Status: AC
  Filled 2017-08-20: qty 3

## 2017-08-20 MED ORDER — FAMOTIDINE IN NACL 20-0.9 MG/50ML-% IV SOLN
INTRAVENOUS | Status: AC
  Filled 2017-08-20: qty 50

## 2017-08-20 MED ORDER — PALONOSETRON HCL INJECTION 0.25 MG/5ML
INTRAVENOUS | Status: AC
  Filled 2017-08-20: qty 5

## 2017-08-20 MED ORDER — SODIUM CHLORIDE 0.9 % IV SOLN
591.0000 mg | Freq: Once | INTRAVENOUS | Status: AC
  Administered 2017-08-20: 590 mg via INTRAVENOUS
  Filled 2017-08-20: qty 59

## 2017-08-20 MED ORDER — METHYLPREDNISOLONE SODIUM SUCC 125 MG IJ SOLR
125.0000 mg | Freq: Once | INTRAMUSCULAR | Status: AC | PRN
  Administered 2017-08-20: 125 mg via INTRAVENOUS

## 2017-08-20 MED ORDER — DEXAMETHASONE SODIUM PHOSPHATE 100 MG/10ML IJ SOLN
20.0000 mg | Freq: Once | INTRAMUSCULAR | Status: AC
  Administered 2017-08-20: 20 mg via INTRAVENOUS
  Filled 2017-08-20: qty 2

## 2017-08-20 MED ORDER — ALBUTEROL SULFATE (2.5 MG/3ML) 0.083% IN NEBU
2.5000 mg | INHALATION_SOLUTION | Freq: Once | RESPIRATORY_TRACT | Status: AC | PRN
  Administered 2017-08-20: 2.5 mg via RESPIRATORY_TRACT
  Filled 2017-08-20: qty 3

## 2017-08-20 MED ORDER — SODIUM CHLORIDE 0.9 % IV SOLN
175.0000 mg/m2 | Freq: Once | INTRAVENOUS | Status: AC
  Administered 2017-08-20: 378 mg via INTRAVENOUS
  Filled 2017-08-20: qty 63

## 2017-08-20 MED ORDER — SODIUM CHLORIDE 0.9 % IV SOLN
Freq: Once | INTRAVENOUS | Status: AC
  Administered 2017-08-20: 12:00:00 via INTRAVENOUS

## 2017-08-20 MED ORDER — PALONOSETRON HCL INJECTION 0.25 MG/5ML
0.2500 mg | Freq: Once | INTRAVENOUS | Status: AC
  Administered 2017-08-20: 0.25 mg via INTRAVENOUS

## 2017-08-20 NOTE — Progress Notes (Signed)
[facility] OFFICE PROGRESS NOTE  Dixon, William Stanley, William Stanley [address] [place] [address]  DIAGNOSIS: Highly suspicious for stage IV (T2b,N2, M0) non-small cell carcinoma, squamous cell,presented with right lower lobe lung mass with associated postobstructive pneumonitis as well as left hilar and subcarinal lymphadenopathy and left pleural effusion.  PRIOR THERAPY: None  CURRENT THERAPY: Theradiation to the left lower lobe lung mass 07/03/2017.  Has been on hold since the patient has been unable to lay flat without coughing. Systemic chemotherapy with carboplatin for an AUC of 5 and paclitaxel 175 mg/m.  First dose 07/27/2017.  INTERVAL HISTORY: William Stanley 71 y.o. male returns for routine follow-up visit accompanied by his friend.  Patient is feeling fine today has no specific complaints except for decreased appetite, weight loss, and cough.  He reports that he has no taste for anything and he has lost about 20 pounds since his last visit.  He is trying to drink 2-3 cans of boost or Ensure per day.  He is being followed by our dietitian.  He reports that his cough improved following his first cycle of chemotherapy, but he still cannot lay flat to resume his radiation.  He uses Hycodan as needed.  He tolerated his first cycle of chemotherapy fairly well.  He denies fevers and chills.  Denies chest pain, shortness of breath, hemoptysis.  Denies nausea, vomiting, constipation, diarrhea.  He denies neuropathy.  The patient is here for evaluation prior to cycle 2 of his chemotherapy.  MEDICAL HISTORY: Past Medical History:  Diagnosis Date  . Atrial fibrillation (HCC)   . Diabetes (HCC)   . DVT (deep venous thrombosis) (HCC)   . Edema of lower extremity   . Hypertension   . Irritable bowel syndrome (IBS)   . Lung collapse    LEFT LUNG, TWICE  . Pulmonary embolism (HCC)     ALLERGIES:  is allergic to fish allergy and penicillins.  MEDICATIONS:  Current  Outpatient Medications  Medication Sig Dispense Refill  . ACCU-CHEK AVIVA PLUS test strip CHECK BLOOD SUGAR THREE TIMES DAILY 250 each 0  . ACCU-CHEK SOFTCLIX LANCETS lancets CHECK BLOOD SUGAR THREE TIMES DAILY 200 each 0  . acetaminophen (TYLENOL) 500 MG tablet Take 1,000 mg daily as needed by mouth for moderate pain or headache.    . Alcohol Swabs (ALCOHOL PADS) 70 % PADS Use to check blood sugar daily 100 each 11  . blood glucose meter kit and supplies KIT Dispense based on patient and insurance preference. Check blood sugar three times daily  (FOR ICD-10E11.5). 1 each 0  . cetirizine (ZYRTEC) 10 MG tablet TAKE 1 TABLET BY MOUTH EVERY DAY 30 tablet 0  . docusate sodium (COLACE) 100 MG capsule Take 100 mg by mouth 2 (two) times daily.    . HYDROcodone-homatropine (HYCODAN) 5-1.5 MG/5ML syrup Take 5 mLs by mouth every 6 (six) hours as needed for cough. 473 mL 0  . metFORMIN (GLUCOPHAGE) 500 MG tablet TAKE 1 TABLET BY MOUTH TWICE DAILY WITH MEALS 60 tablet 0  . polyethylene glycol (MIRALAX / GLYCOLAX) packet Take 17 g by mouth daily.    . pravastatin (PRAVACHOL) 20 MG tablet TAKE 1 TABLET BY MOUTH EVERY DAY 90 tablet 0  . tamsulosin (FLOMAX) 0.4 MG CAPS capsule Take 1 capsule (0.4 mg total) by mouth daily. (Patient taking differently: Take 0.4 mg at bedtime by mouth. ) 90 capsule 1  . VENTOLIN HFA 108 (90 Base) MCG/ACT inhaler INHALE 1-2   PUFFS INTO LUNGS AS NEEDED (Patient taking differently: Inhale 1 to 2 puffs into the lungs every 6 hours as needed for shortness of breath) 18 g 3  . warfarin (COUMADIN) 5 MG tablet Take 1 to 1 and 1/2 tablets daily as  directed by [facility]. (Patient taking differently: Take 5-7.5 mg See admin instructions by mouth. Take 5 mg by mouth once daily in the evening on Mon, Wed, and Fri, and 7.5 mg once daily on Sun, Tues, Thurs, and Sat) 135 tablet 0  . bismuth subsalicylate (PEPTO BISMOL) 262 MG/15ML suspension Take 30 mLs every 6 (six) hours as needed by mouth  for indigestion.    . ondansetron (ZOFRAN) 4 MG tablet Take 1 tablet (4 mg total) by mouth every 8 (eight) hours as needed for nausea or vomiting. (Patient not taking: Reported on 08/20/2017) 30 tablet 0  . prochlorperazine (COMPAZINE) 10 MG tablet Take 1 tablet (10 mg total) by mouth every 6 (six) hours as needed for nausea or vomiting. (Patient not taking: Reported on 08/20/2017) 30 tablet 1   No current facility-administered medications for this visit.     SURGICAL HISTORY:  Past Surgical History:  Procedure Laterality Date  . EYE SURGERY    . HERNIA REPAIR  1992  . IR THORACENTESIS ASP PLEURAL SPACE W/IMG GUIDE  06/18/2017  . VENOUS LIGATION SURGERY  1988  . VIDEO BRONCHOSCOPY Bilateral 06/25/2017   Procedure: VIDEO BRONCHOSCOPY WITH FLUORO;  Surgeon: Juanito Doom, MD;  Location: [facility];  Service: Cardiopulmonary;  Laterality: Bilateral;    REVIEW OF SYSTEMS:   Review of Systems  Constitutional: Negative for chills, fatigue, fever.  Positive for decreased appetite and weight loss.  HENT:   Negative for mouth sores, nosebleeds, sore throat and trouble swallowing.   Eyes: Negative for eye problems and icterus.  Respiratory: Negative for hemoptysis, shortness of breath and wheezing.  Positive for cough. Cardiovascular: Negative for chest pain and leg swelling.  Gastrointestinal: Negative for abdominal pain, constipation, diarrhea, nausea and vomiting.  Genitourinary: Negative for bladder incontinence, difficulty urinating, dysuria, frequency and hematuria.   Musculoskeletal: Negative for back pain, gait problem, neck pain and neck stiffness.  Skin: Negative for itching and rash.  Neurological: Negative for dizziness, extremity weakness, gait problem, headaches, light-headedness and seizures.  Hematological: Negative for adenopathy. Does not bruise/bleed easily.  Psychiatric/Behavioral: Negative for confusion, depression and sleep disturbance. The patient is not  nervous/anxious.     PHYSICAL EXAMINATION:  Blood pressure (!) 97/53, pulse 91, temperature 97.8 F (36.6 C), temperature source Oral, resp. rate 20, height 5' 8" (1.727 m), weight 190 lb 3.2 oz (86.3 kg), SpO2 94 %.  ECOG PERFORMANCE STATUS: 1 - Symptomatic but completely ambulatory  Physical Exam  Constitutional: Oriented to person, place, and time and well-developed, well-nourished, and in no distress. No distress.  HENT:  Head: Normocephalic and atraumatic.  Mouth/Throat: Oropharynx is clear and moist. No oropharyngeal exudate.  Eyes: Conjunctivae are normal. Right eye exhibits no discharge. Left eye exhibits no discharge. No scleral icterus.  Neck: Normal range of motion. Neck supple.  Cardiovascular: Normal rate, regular rhythm, normal heart sounds and intact distal pulses.   Pulmonary/Chest: Effort normal and breath sounds normal. No respiratory distress. No wheezes. No rales.  Abdominal: Soft. Bowel sounds are normal. Exhibits no distension and no mass. There is no tenderness.  Musculoskeletal: Normal range of motion. Exhibits no edema.  Lymphadenopathy:    No cervical adenopathy.  Neurological: Alert and oriented to person, place, and  time. Exhibits normal muscle tone. Gait normal. Coordination normal.  Skin: Skin is warm and dry. No rash noted. Not diaphoretic. No erythema. No pallor.  Psychiatric: Mood, memory and judgment normal.  Vitals reviewed.  LABORATORY DATA: Lab Results  Component Value Date   WBC 10.0 08/20/2017   HGB 9.1 (L) 08/20/2017   HCT 28.2 (L) 08/20/2017   MCV 85.7 08/20/2017   PLT 646 (H) 08/20/2017      Chemistry      Component Value Date/Time   NA 137 08/20/2017 0948   NA 133 (L) 07/23/2017 1153   K 3.5 08/20/2017 0948   K 3.8 07/23/2017 1153   CL 99 08/20/2017 0948   CO2 28 08/20/2017 0948   CO2 27 07/23/2017 1153   BUN 11 08/20/2017 0948   BUN 16.5 07/23/2017 1153   CREATININE 0.80 08/20/2017 0948   CREATININE 0.8 07/23/2017 1153       Component Value Date/Time   CALCIUM 8.8 08/20/2017 0948   CALCIUM 9.1 07/23/2017 1153   ALKPHOS 81 08/20/2017 0948   ALKPHOS 103 07/23/2017 1153   AST 27 08/20/2017 0948   AST 70 (H) 07/23/2017 1153   ALT 25 08/20/2017 0948   ALT 68 (H) 07/23/2017 1153   BILITOT 0.6 08/20/2017 0948   BILITOT 0.57 07/23/2017 1153       RADIOGRAPHIC STUDIES:  Dg Chest 2 View  Result Date: 07/25/2017 CLINICAL DATA:  72 year old male with a history of cough congestion and shortness of breath EXAM: CHEST  2 VIEW COMPARISON:  Plain film 07/04/2017, PET-CT 06/25/2017 FINDINGS: Cardiomediastinal silhouette likely unchanged with partial obscuration of the left heart border and left hemidiaphragm secondary to left lung/ pleural disease. Airspace opacity of nearly the entire left lower lobe persists with air-fluid levels centered at the posterior left lower lobe. Persisting left-sided pleural effusion. Right lung relatively well aerated. Bilateral pleuroparenchymal thickening at the apices. No displaced fracture.  Degenerative changes of the spine. IMPRESSION: Relatively unchanged appearance of the chest x-ray, with left lower lobe consolidation and pulmonic air-fluid level compatible with necrotic focus. Superimposed pneumonia on this patient's known malignancy difficult to exclude. Unchanged small left pleural effusion. Right lung remains relatively well aerated. Electronically Signed   By: Corrie Mckusick D.O.   On: 07/25/2017 17:07     ASSESSMENT/PLAN:  Stage IV squamous cell carcinoma of left lung ([facility]) This is a very pleasant 72year old white male with highly suspicious stage IV non-small cell lung cancer, squamous cell,presented with left lower lobe obstructive lung mass with postobstructive pneumonitis as well as left hilar and mediastinal lymphadenopathy and left pleural effusion.   The patient received a brief course of radiation to his lung mass, but could not complete the course of radiation due to  coughing when he lays flat.    The patient may resume his radiation under the care of Dr. Sondra Come if he can get his cough under better control.  He is currently on systemic chemotherapy with carboplatin for an AUC of 5 and paclitaxel 175 mg/m given every 3 weeks. Status post 1 cycle.   He tolerated the first cycle of chemotherapy well overall.  The patient was seen with Dr. Julien Nordmann.  Labs reviewed and recommend that he proceed with cycle 2 of his chemotherapy today as scheduled.  The patient's insurance company has not approved the Neulasta injection so we will not be given with the cycle of chemotherapy.  Explained to the patient that he will need to have weekly labs for close  monitoring.  Follow-up visit will be in 3 weeks for evaluation prior to cycle 3 of his chemotherapy.  For his cough, he will continue Hycodan as needed.  He has a follow-up with pulmonology later this month.  He will follow up with dietitian later today for his weight loss.  He will follow-up with radiation oncology per their recommendations.  The patient was advised to call immediately if he has any concerning symptoms in the interval. The patient voices understanding of current disease status and treatment options and is in agreement with the current care plan.  All questions were answered. The patient knows to call the clinic with any problems, questions or concerns. We can certainly see the patient much sooner if necessary  No orders of the defined types were placed in this encounter.    , DNP, AGPCNP-BC, AOCNP 08/20/17  ADDENDUM: Hematology/Oncology Attending: I had a face-to-face encounter with the patient today.  I recommended his care plan.  This is a very pleasant 71 years old white male with a stage IV non-small cell lung cancer, squamous cell carcinoma.  The patient is currently undergoing systemic chemotherapy with carboplatin, and paclitaxel, status post 1 cycle.  He tolerated the first cycle  of his treatment well except for arthralgia from the Neulasta injection.  I recommended for the patient to proceed with cycle #2 today as a scheduled. Unfortunately his insurance is denying the coverage for Neulasta.  We will continue to monitor his blood count closely in the next few weeks and if the patient develop any significant neutropenia will contact his insurance company again for coverage. He will come back for follow-up visit in 3 weeks for evaluation before starting cycle #3. The patient was advised to call immediately if he has any concerning symptoms in the interval.  Disclaimer: This note was dictated with voice recognition software. Similar sounding words can inadvertently be transcribed and may be missed upon review. Mohamed K Mohamed, MD 08/20/17     

## 2017-08-20 NOTE — Progress Notes (Signed)
[facility] Progress Note   DOCK BACCAM [id number] January 28, 1946 72 y.o.  CATHAN GEARIN is managed by Dr. Eilleen Kempf  Actively treated with chemotherapy: yes  Current Therapy: Carboplatin and Taxol  Last Treated: 08/20/2017  Assessment: Plan:    Adverse effect of chemotherapy, initial encounter  ARRINGTON YOHE was seen in the infusion room for a suspected chemotherapy reaction. He was receiving Taxol at the time of his reaction. His symptoms included: Acute bilateral lower back pain He was premedicated with Benadryl 50 mg IV, Pepcid 20 mg IV, Aloxi, and Decadron 20 mg IV prior to starting chemotherapy. Taxol was paused and SAEED TOREN was given Solu-Medrol 125 mg IV and an albuterol nebulizer after onset of his symptoms. Chung F Encalade did  respond to intervention.  The patient's chemotherapy was restarted without any additional side effects.   Please see After Visit Summary for patient specific instructions.  Future Appointments  Date Time Provider Reading  08/24/2017  3:50 PM [facility]  08/28/2017 11:15 AM [facility] 2 [facility] None  09/04/2017 11:15 AM [facility] LAB 1 [facility] None  09/05/2017  9:45 AM McQuaid, Ronie Spies, MD [facility] None  09/10/2017  8:45 AM [facility] LAB 5 [facility] None  09/10/2017  9:15 AM Curt Bears, MD [facility] None  09/10/2017 10:15 AM [facility] C9 [facility] None  09/10/2017 11:15 AM Karie Mainland, RD [facility] None  09/13/2017  9:20 AM Lelon Perla, MD [facility]  09/18/2017 11:15 AM [facility] LAB 4 [facility] None  09/25/2017 11:15 AM [facility] LAB 4 [facility] None  10/01/2017  9:30 AM [facility] LAB 2 [facility] None  10/01/2017 10:00 AM Curt Bears, MD [facility] None  10/01/2017 11:00 AM [facility] G24 [facility] None  10/01/2017 12:00 PM Ernestene Kiel L, RD [facility] None  10/09/2017 11:15 AM  [facility] LAB 4 [facility] None  10/16/2017 11:15 AM [facility] LAB 2 [facility] None  11-12-2017  8:45 AM [facility] LAB 2 [facility] None  11-12-2017  9:15 AM Curt Bears, MD [facility] None  2017/11/12 10:15 AM [facility] G23 [facility] None    No orders of the defined types were placed in this encounter.      Subjective:   Patient ID:  BRAXSTON QUINTER is a 72 y.o. (DOB 09-07-1945) male.  Chief Complaint: No chief complaint on file.   HPI YASER HARVILL was seen in the infusion room for a suspected reaction to chemotherapy. He was receiving Taxol at the time of his reaction. His symptoms included: Acute bilateral lower back pain. He was premedicated with Benadryl 50 mg IV, Pepcid 20 mg IV, Aloxi, and Decadron 20 mg IV prior to starting chemotherapy. Taxol was paused and SUTTON HIRSCH was given Solu-Medrol 125 mg IV and an albuterol nebulizer after onset of his symptoms. Giovonnie F Mendibles did  respond to intervention.  The patient's chemotherapy was restarted without any additional side effects.  Medications: I have reviewed the patient's current medications.  Allergies:  Allergies  Allergen Reactions  . Fish Allergy     Sweaty, upset stomach  . Penicillins     Blackouts  Has patient had a PCN reaction causing immediate rash, facial/tongue/throat swelling, SOB or lightheadedness with hypotension: Yes Has patient had a PCN reaction causing severe rash involving mucus membranes or skin necrosis: No Has patient had a PCN reaction that required hospitalization: No Has patient had a PCN reaction occurring within the last 10 years: No If all of the above answers  are "NO", then may proceed with Cephalosporin use.     Past Medical History:  Diagnosis Date  . Atrial fibrillation ([place])   . Diabetes ([place])   . DVT (deep venous thrombosis) ([place])   . Edema of lower extremity   . Hypertension   . Irritable bowel syndrome (IBS)   . Lung collapse    LEFT LUNG, TWICE    . Pulmonary embolism [facility])     Past Surgical History:  Procedure Laterality Date  . EYE SURGERY    . HERNIA REPAIR  1992  . IR THORACENTESIS ASP PLEURAL SPACE W/IMG GUIDE  06/18/2017  . VENOUS LIGATION SURGERY  1988  . VIDEO BRONCHOSCOPY Bilateral 06/25/2017   Procedure: VIDEO BRONCHOSCOPY WITH FLUORO;  Surgeon: Juanito Doom, MD;  Location: [facility];  Service: Cardiopulmonary;  Laterality: Bilateral;    Family History  Problem Relation Age of Onset  . Heart attack Mother   . Heart failure Mother   . Arrhythmia Father   . Heart failure Father     Social History   Socioeconomic History  . Marital status: Married    Spouse name: Not on file  . Number of children: Not on file  . Years of education: Not on file  . Highest education level: Not on file  Social Needs  . Financial resource strain: Not on file  . Food insecurity - worry: Not on file  . Food insecurity - inability: Not on file  . Transportation needs - medical: Not on file  . Transportation needs - non-medical: Not on file  Occupational History  . Not on file  Tobacco Use  . Smoking status: Former Smoker    Packs/day: 0.25    Years: 15.00    Pack years: 3.75    Types: Cigarettes    Last attempt to quit: 07/07/2012    Years since quitting: 5.1  . Smokeless tobacco: Never Used  Substance and Sexual Activity  . Alcohol use: No    Alcohol/week: 0.0 oz  . Drug use: No  . Sexual activity: Not on file  Other Topics Concern  . Not on file  Social History Narrative  . Not on file    Past Medical History, Surgical history, Social history, and Family history were reviewed and updated as appropriate.   Please see review of systems for further details on the patient's review from today.   Review of Systems:  Review of Systems  Constitutional: Negative for chills, diaphoresis and fever.  HENT: Negative for facial swelling and trouble swallowing.   Respiratory: Negative for cough, choking, chest  tightness, shortness of breath and wheezing.   Cardiovascular: Negative for chest pain, palpitations and leg swelling.  Gastrointestinal: Negative for nausea and vomiting.  Musculoskeletal: Positive for back pain.    Objective:   Physical Exam:  There were no vitals taken for this visit.   Physical Exam  Constitutional: No distress.  HENT:  Head: Normocephalic and atraumatic.  Cardiovascular: Normal rate, regular rhythm and normal heart sounds. Exam reveals no gallop and no friction rub.  No murmur heard. Pulmonary/Chest: Effort normal. He has wheezes (Wheezing in the right lung field.).  Coarse breath sounds throughout the left lung field greater at the left lung base than at the upper lung field.  Neurological: He is alert.  Skin: Skin is warm and dry. He is not diaphoretic.    Lab Review:     Component Value Date/Time   NA 137 08/20/2017 0948   NA 133 (L)  07/23/2017 1153   K 3.5 08/20/2017 0948   K 3.8 07/23/2017 1153   CL 99 08/20/2017 0948   CO2 28 08/20/2017 0948   CO2 27 07/23/2017 1153   GLUCOSE 106 08/20/2017 0948   GLUCOSE 106 07/23/2017 1153   BUN 11 08/20/2017 0948   BUN 16.5 07/23/2017 1153   CREATININE 0.80 08/20/2017 0948   CREATININE 0.8 07/23/2017 1153   CALCIUM 8.8 08/20/2017 0948   CALCIUM 9.1 07/23/2017 1153   PROT 6.8 08/20/2017 0948   PROT 6.9 07/23/2017 1153   ALBUMIN 1.8 (L) 08/20/2017 0948   ALBUMIN 1.9 (L) 07/23/2017 1153   AST 27 08/20/2017 0948   AST 70 (H) 07/23/2017 1153   ALT 25 08/20/2017 0948   ALT 68 (H) 07/23/2017 1153   ALKPHOS 81 08/20/2017 0948   ALKPHOS 103 07/23/2017 1153   BILITOT 0.6 08/20/2017 0948   BILITOT 0.57 07/23/2017 1153   GFRNONAA >60 08/20/2017 0948   GFRNONAA 67 04/19/2017 1219   GFRAA >60 08/20/2017 0948   GFRAA 78 04/19/2017 1219       Component Value Date/Time   WBC 10.0 08/20/2017 0948   RBC 3.30 (L) 08/20/2017 0948   HGB 9.1 (L) 08/20/2017 0948   HGB 9.7 (L) 07/23/2017 1153   HCT 28.2 (L)  08/20/2017 0948   HCT 29.9 (L) 07/23/2017 1153   PLT 646 (H) 08/20/2017 0948   PLT 387 07/23/2017 1153   MCV 85.7 08/20/2017 0948   MCV 84.9 07/23/2017 1153   MCH 27.5 08/20/2017 0948   MCHC 32.2 08/20/2017 0948   RDW 19.7 (H) 08/20/2017 0948   RDW 16.8 (H) 07/23/2017 1153   LYMPHSABS 0.9 08/20/2017 0948   LYMPHSABS 0.9 07/23/2017 1153   MONOABS 1.1 (H) 08/20/2017 0948   MONOABS 0.7 07/23/2017 1153   EOSABS 0.1 08/20/2017 0948   EOSABS 0.1 07/23/2017 1153   BASOSABS 0.1 08/20/2017 0948   BASOSABS 0.0 07/23/2017 1153   -------------------------------  Imaging from last 24 hours (if applicable):  Radiology interpretation: Dg Chest 2 View  Result Date: 07/25/2017 CLINICAL DATA:  72 year old male with a history of cough congestion and shortness of breath EXAM: CHEST  2 VIEW COMPARISON:  Plain film 07/04/2017, PET-CT 06/25/2017 FINDINGS: Cardiomediastinal silhouette likely unchanged with partial obscuration of the left heart border and left hemidiaphragm secondary to left lung/ pleural disease. Airspace opacity of nearly the entire left lower lobe persists with air-fluid levels centered at the posterior left lower lobe. Persisting left-sided pleural effusion. Right lung relatively well aerated. Bilateral pleuroparenchymal thickening at the apices. No displaced fracture.  Degenerative changes of the spine. IMPRESSION: Relatively unchanged appearance of the chest x-ray, with left lower lobe consolidation and pulmonic air-fluid level compatible with necrotic focus. Superimposed pneumonia on this patient's known malignancy difficult to exclude. Unchanged small left pleural effusion. Right lung remains relatively well aerated. Electronically Signed   By: Corrie Mckusick D.O.   On: 07/25/2017 17:07

## 2017-08-20 NOTE — Progress Notes (Signed)
Nutrition follow-up completed with patient during infusion for lung cancer. Weight decreased and documented as 190.2 pounds January 14 decreased from 214 pounds November 27.   This is an 11% weight loss over 3 weeks. He reports taste alterations, poor appetite, early satiety. Patient does not feel well and has just vomited after coughing.  Nutrition diagnosis: Food inadequate oral intake and severe malnutrition.  Continue.  Intervention: Patient encouraged to continue small frequent meals and snacks utilizing high-calorie, high-protein foods. Recommended patient increase oral nutrition supplements 3 times a day Provided coupons. Educated patient on taste alterations and poor appetite as well as early satiety. Questions were answered.  Teach back method used.  Contact information was provided.  Monitoring, evaluation, goals: Patient will increase calories and protein to minimize further weight loss.  Next visit: Monday, February 4, during infusion.  **Disclaimer: This note was dictated with voice recognition software. Similar sounding words can inadvertently be transcribed and this note may contain transcription errors which may not have been corrected upon publication of note.**

## 2017-08-20 NOTE — Telephone Encounter (Signed)
Scheduled appt per 1/14 los - Pt to get an updated schedule in the treatment area.

## 2017-08-20 NOTE — Assessment & Plan Note (Addendum)
This is a very pleasant 72year old white male with highly suspicious stage IV non-small cell lung cancer, squamous cell,presented with left lower lobe obstructive lung mass with postobstructive pneumonitis as well as left hilar and mediastinal lymphadenopathy and left pleural effusion.   The patient received a brief course of radiation to his lung mass, but could not complete the course of radiation due to coughing when he lays flat.    The patient may resume his radiation under the care of Dr. Sondra Come if he can get his cough under better control.  He is currently on systemic chemotherapy with carboplatin for an AUC of 5 and paclitaxel 175 mg/m given every 3 weeks. Status post 1 cycle.   He tolerated the first cycle of chemotherapy well overall.  The patient was seen with Dr. Julien Nordmann.  Labs reviewed and recommend that he proceed with cycle 2 of his chemotherapy today as scheduled.  The patient's insurance company has not approved the Neulasta injection so we will not be given with the cycle of chemotherapy.  Explained to the patient that he will need to have weekly labs for close monitoring.  Follow-up visit will be in 3 weeks for evaluation prior to cycle 3 of his chemotherapy.  For his cough, he will continue Hycodan as needed.  He has a follow-up with pulmonology later this month.  He will follow up with dietitian later today for his weight loss.  He will follow-up with radiation oncology per their recommendations.  The patient was advised to call immediately if he has any concerning symptoms in the interval. The patient voices understanding of current disease status and treatment options and is in agreement with the current care plan.  All questions were answered. The patient knows to call the clinic with any problems, questions or concerns. We can certainly see the patient much sooner if necessary

## 2017-08-20 NOTE — Patient Instructions (Signed)
   [facility] Discharge Instructions for Patients Receiving Chemotherapy  Today you received the following chemotherapy agents Taxol and Carboplatin   To help prevent nausea and vomiting after your treatment, we encourage you to take your nausea medication as directed.    If you develop nausea and vomiting that is not controlled by your nausea medication, call the clinic.   BELOW ARE SYMPTOMS THAT SHOULD BE REPORTED IMMEDIATELY:  *FEVER GREATER THAN 100.5 F  *CHILLS WITH OR WITHOUT FEVER  NAUSEA AND VOMITING THAT IS NOT CONTROLLED WITH YOUR NAUSEA MEDICATION  *UNUSUAL SHORTNESS OF BREATH  *UNUSUAL BRUISING OR BLEEDING  TENDERNESS IN MOUTH AND THROAT WITH OR WITHOUT PRESENCE OF ULCERS  *URINARY PROBLEMS  *BOWEL PROBLEMS  UNUSUAL RASH Items with * indicate a potential emergency and should be followed up as soon as possible.  Feel free to call the clinic should you have any questions or concerns. The clinic phone number is [phone].  Please show the CHEMO ALERT CARD at check-in to the Emergency Department and triage nurse.   

## 2017-08-20 NOTE — Progress Notes (Signed)
1340- Patient with complaints of severe back pain. Stated "it feels like someone is stabbing me in the back". Infusion stopped. Normal saline infusing wide open. VSS taken. BP 109/74, pulse 86, oxygen level 98%, resp rate 24. Sandi Mealy, PA called to infusion room.  1346- Solumedrol 125mg  IV given per Sandi Mealy, PA.  1348- BP 111/67, pulse 90, O2 sat 98%. Patient states he feels better.  1356- Albuterol nebulizer given per Sandi Mealy, PA.  1402- BP 104/78, pulse 77, resp. 26,  1415- No complaints. Patient states he is breathing better and denies back pain. Infusion restarted per Sandi Mealy, PA.

## 2017-08-22 ENCOUNTER — Ambulatory Visit: Payer: Medicare HMO

## 2017-08-22 ENCOUNTER — Other Ambulatory Visit: Payer: Self-pay | Admitting: Physician Assistant

## 2017-08-22 DIAGNOSIS — B9789 Other viral agents as the cause of diseases classified elsewhere: Secondary | ICD-10-CM

## 2017-08-22 DIAGNOSIS — J988 Other specified respiratory disorders: Principal | ICD-10-CM

## 2017-08-22 NOTE — Telephone Encounter (Signed)
Refill appropriate 

## 2017-08-23 ENCOUNTER — Other Ambulatory Visit: Payer: Self-pay | Admitting: Medical Oncology

## 2017-08-23 ENCOUNTER — Telehealth: Payer: Self-pay | Admitting: Medical Oncology

## 2017-08-23 ENCOUNTER — Emergency Department (HOSPITAL_COMMUNITY): Payer: Medicare HMO

## 2017-08-23 ENCOUNTER — Other Ambulatory Visit: Payer: Self-pay

## 2017-08-23 ENCOUNTER — Encounter (HOSPITAL_COMMUNITY): Payer: Self-pay | Admitting: Emergency Medicine

## 2017-08-23 ENCOUNTER — Inpatient Hospital Stay (HOSPITAL_COMMUNITY)
Admission: EM | Admit: 2017-08-23 | Discharge: 2017-08-31 | DRG: 871 | Disposition: A | Discharge status: Hospice | Payer: Medicare HMO | Attending: Internal Medicine | Admitting: Internal Medicine

## 2017-08-23 DIAGNOSIS — C3492 Malignant neoplasm of unspecified part of left bronchus or lung: Secondary | ICD-10-CM | POA: Diagnosis not present

## 2017-08-23 DIAGNOSIS — Z86718 Personal history of other venous thrombosis and embolism: Secondary | ICD-10-CM | POA: Diagnosis not present

## 2017-08-23 DIAGNOSIS — E876 Hypokalemia: Secondary | ICD-10-CM | POA: Diagnosis present

## 2017-08-23 DIAGNOSIS — R609 Edema, unspecified: Secondary | ICD-10-CM | POA: Diagnosis not present

## 2017-08-23 DIAGNOSIS — J189 Pneumonia, unspecified organism: Secondary | ICD-10-CM | POA: Diagnosis present

## 2017-08-23 DIAGNOSIS — E119 Type 2 diabetes mellitus without complications: Secondary | ICD-10-CM

## 2017-08-23 DIAGNOSIS — J860 Pyothorax with fistula: Secondary | ICD-10-CM | POA: Diagnosis not present

## 2017-08-23 DIAGNOSIS — Z8249 Family history of ischemic heart disease and other diseases of the circulatory system: Secondary | ICD-10-CM

## 2017-08-23 DIAGNOSIS — A419 Sepsis, unspecified organism: Secondary | ICD-10-CM | POA: Diagnosis present

## 2017-08-23 DIAGNOSIS — M546 Pain in thoracic spine: Secondary | ICD-10-CM | POA: Diagnosis not present

## 2017-08-23 DIAGNOSIS — Z66 Do not resuscitate: Secondary | ICD-10-CM | POA: Diagnosis present

## 2017-08-23 DIAGNOSIS — I4891 Unspecified atrial fibrillation: Secondary | ICD-10-CM | POA: Diagnosis present

## 2017-08-23 DIAGNOSIS — R0902 Hypoxemia: Secondary | ICD-10-CM

## 2017-08-23 DIAGNOSIS — M47816 Spondylosis without myelopathy or radiculopathy, lumbar region: Secondary | ICD-10-CM | POA: Diagnosis not present

## 2017-08-23 DIAGNOSIS — Z7984 Long term (current) use of oral hypoglycemic drugs: Secondary | ICD-10-CM

## 2017-08-23 DIAGNOSIS — Z515 Encounter for palliative care: Secondary | ICD-10-CM | POA: Diagnosis present

## 2017-08-23 DIAGNOSIS — C3432 Malignant neoplasm of lower lobe, left bronchus or lung: Secondary | ICD-10-CM | POA: Diagnosis present

## 2017-08-23 DIAGNOSIS — I2510 Atherosclerotic heart disease of native coronary artery without angina pectoris: Secondary | ICD-10-CM | POA: Diagnosis present

## 2017-08-23 DIAGNOSIS — K589 Irritable bowel syndrome without diarrhea: Secondary | ICD-10-CM | POA: Diagnosis present

## 2017-08-23 DIAGNOSIS — J181 Lobar pneumonia, unspecified organism: Secondary | ICD-10-CM | POA: Diagnosis not present

## 2017-08-23 DIAGNOSIS — M545 Low back pain: Secondary | ICD-10-CM | POA: Diagnosis not present

## 2017-08-23 DIAGNOSIS — Z86711 Personal history of pulmonary embolism: Secondary | ICD-10-CM

## 2017-08-23 DIAGNOSIS — Z87891 Personal history of nicotine dependence: Secondary | ICD-10-CM | POA: Diagnosis not present

## 2017-08-23 DIAGNOSIS — J188 Other pneumonia, unspecified organism: Secondary | ICD-10-CM | POA: Diagnosis not present

## 2017-08-23 DIAGNOSIS — J90 Pleural effusion, not elsewhere classified: Secondary | ICD-10-CM | POA: Diagnosis present

## 2017-08-23 DIAGNOSIS — C3490 Malignant neoplasm of unspecified part of unspecified bronchus or lung: Secondary | ICD-10-CM | POA: Diagnosis not present

## 2017-08-23 DIAGNOSIS — Y95 Nosocomial condition: Secondary | ICD-10-CM | POA: Diagnosis present

## 2017-08-23 DIAGNOSIS — M549 Dorsalgia, unspecified: Secondary | ICD-10-CM

## 2017-08-23 DIAGNOSIS — D638 Anemia in other chronic diseases classified elsewhere: Secondary | ICD-10-CM | POA: Diagnosis present

## 2017-08-23 DIAGNOSIS — D6481 Anemia due to antineoplastic chemotherapy: Secondary | ICD-10-CM | POA: Diagnosis present

## 2017-08-23 DIAGNOSIS — R0602 Shortness of breath: Secondary | ICD-10-CM | POA: Diagnosis not present

## 2017-08-23 DIAGNOSIS — Z880 Allergy status to penicillin: Secondary | ICD-10-CM

## 2017-08-23 DIAGNOSIS — J948 Other specified pleural conditions: Secondary | ICD-10-CM | POA: Diagnosis present

## 2017-08-23 DIAGNOSIS — T451X5A Adverse effect of antineoplastic and immunosuppressive drugs, initial encounter: Secondary | ICD-10-CM | POA: Diagnosis present

## 2017-08-23 DIAGNOSIS — N390 Urinary tract infection, site not specified: Secondary | ICD-10-CM | POA: Diagnosis present

## 2017-08-23 DIAGNOSIS — J9601 Acute respiratory failure with hypoxia: Secondary | ICD-10-CM | POA: Diagnosis present

## 2017-08-23 DIAGNOSIS — D701 Agranulocytosis secondary to cancer chemotherapy: Secondary | ICD-10-CM | POA: Diagnosis present

## 2017-08-23 DIAGNOSIS — I10 Essential (primary) hypertension: Secondary | ICD-10-CM | POA: Diagnosis present

## 2017-08-23 DIAGNOSIS — Z79899 Other long term (current) drug therapy: Secondary | ICD-10-CM

## 2017-08-23 DIAGNOSIS — Z7901 Long term (current) use of anticoagulants: Secondary | ICD-10-CM

## 2017-08-23 DIAGNOSIS — Z7189 Other specified counseling: Secondary | ICD-10-CM | POA: Diagnosis not present

## 2017-08-23 DIAGNOSIS — Z91013 Allergy to seafood: Secondary | ICD-10-CM

## 2017-08-23 DIAGNOSIS — Z5111 Encounter for antineoplastic chemotherapy: Secondary | ICD-10-CM | POA: Diagnosis not present

## 2017-08-23 LAB — COMPREHENSIVE METABOLIC PANEL
ALBUMIN: 2.3 g/dL — AB (ref 3.5–5.0)
ALK PHOS: 68 U/L (ref 38–126)
ALT: 20 U/L (ref 17–63)
AST: 29 U/L (ref 15–41)
Anion gap: 10 (ref 5–15)
BILIRUBIN TOTAL: 0.9 mg/dL (ref 0.3–1.2)
BUN: 15 mg/dL (ref 6–20)
CO2: 27 mmol/L (ref 22–32)
CREATININE: 0.67 mg/dL (ref 0.61–1.24)
Calcium: 8.8 mg/dL — ABNORMAL LOW (ref 8.9–10.3)
Chloride: 97 mmol/L — ABNORMAL LOW (ref 101–111)
GFR calc Af Amer: >60 mL/min (ref >60)
GFR calc non Af Amer: >60 mL/min (ref >60)
GLUCOSE: 109 mg/dL — AB (ref 65–99)
Potassium: 3.6 mmol/L (ref 3.5–5.1)
SODIUM: 134 mmol/L — AB (ref 135–145)
Total Protein: 7.3 g/dL (ref 6.5–8.1)

## 2017-08-23 LAB — CBC WITH DIFFERENTIAL/PLATELET
BASOS PCT: 0 %
Basophils Absolute: 0 10*3/uL (ref 0.0–0.1)
EOS ABS: 0.1 10*3/uL (ref 0.0–0.7)
Eosinophils Relative: 0 %
HEMATOCRIT: 30.3 % — AB (ref 39.0–52.0)
HEMOGLOBIN: 9.4 g/dL — AB (ref 13.0–17.0)
Lymphocytes Relative: 3 %
Lymphs Abs: 0.4 10*3/uL — ABNORMAL LOW (ref 0.7–4.0)
MCH: 27.8 pg (ref 26.0–34.0)
MCHC: 31 g/dL (ref 30.0–36.0)
MCV: 89.6 fL (ref 78.0–100.0)
Monocytes Absolute: 0.1 10*3/uL (ref 0.1–1.0)
Monocytes Relative: 1 %
NEUTROS ABS: 13 10*3/uL — AB (ref 1.7–7.7)
NEUTROS PCT: 96 %
Platelets: 496 10*3/uL — ABNORMAL HIGH (ref 150–400)
RBC: 3.38 MIL/uL — AB (ref 4.22–5.81)
RDW: 18.7 % — ABNORMAL HIGH (ref 11.5–15.5)
WBC: 13.6 10*3/uL — AB (ref 4.0–10.5)

## 2017-08-23 LAB — PROTIME-INR
INR: 2.4
Prothrombin Time: 25.9 seconds — ABNORMAL HIGH (ref 11.4–15.2)

## 2017-08-23 LAB — I-STAT CG4 LACTIC ACID, ED
LACTIC ACID, VENOUS: 1.67 mmol/L (ref 0.5–1.9)
Lactic Acid, Venous: 1.45 mmol/L (ref 0.5–1.9)

## 2017-08-23 LAB — BRAIN NATRIURETIC PEPTIDE: B Natriuretic Peptide: 92.8 pg/mL (ref 0.0–100.0)

## 2017-08-23 MED ORDER — SODIUM CHLORIDE 0.9 % IV BOLUS (SEPSIS)
1000.0000 mL | Freq: Once | INTRAVENOUS | Status: AC
  Administered 2017-08-24: 1000 mL via INTRAVENOUS

## 2017-08-23 MED ORDER — FENTANYL CITRATE (PF) 100 MCG/2ML IJ SOLN
50.0000 ug | Freq: Once | INTRAMUSCULAR | Status: AC
  Administered 2017-08-23: 50 ug via INTRAVENOUS
  Filled 2017-08-23: qty 2

## 2017-08-23 MED ORDER — IPRATROPIUM-ALBUTEROL 0.5-2.5 (3) MG/3ML IN SOLN
3.0000 mL | Freq: Once | RESPIRATORY_TRACT | Status: AC
  Administered 2017-08-24: 3 mL via RESPIRATORY_TRACT
  Filled 2017-08-23: qty 3

## 2017-08-23 MED ORDER — VANCOMYCIN HCL IN DEXTROSE 1-5 GM/200ML-% IV SOLN
1000.0000 mg | Freq: Once | INTRAVENOUS | Status: AC
  Administered 2017-08-23: 1000 mg via INTRAVENOUS
  Filled 2017-08-23: qty 200

## 2017-08-23 MED ORDER — IOPAMIDOL (ISOVUE-370) INJECTION 76%
INTRAVENOUS | Status: AC
  Filled 2017-08-23: qty 100

## 2017-08-23 MED ORDER — DEXTROSE 5 % IV SOLN
2.0000 g | Freq: Once | INTRAVENOUS | Status: AC
  Administered 2017-08-23: 2 g via INTRAVENOUS
  Filled 2017-08-23: qty 2

## 2017-08-23 MED ORDER — LEVOFLOXACIN IN D5W 750 MG/150ML IV SOLN
750.0000 mg | Freq: Once | INTRAVENOUS | Status: AC
  Administered 2017-08-23: 750 mg via INTRAVENOUS
  Filled 2017-08-23: qty 150

## 2017-08-23 MED ORDER — OXYCODONE-ACETAMINOPHEN 5-325 MG PO TABS: 1.0000 | ORAL_TABLET | Freq: Four times a day (QID) | ORAL | 0 refills | Status: DC | PRN

## 2017-08-23 NOTE — Telephone Encounter (Signed)
William Stanley ordered [employer] and family notified to pick it up.

## 2017-08-23 NOTE — Progress Notes (Signed)
A consult was received from an ED physician for vanc/levaquin/aztreonam per pharmacy dosing.  The patient's profile has been reviewed for ht/wt/allergies/indication/available labs.   A one time order has been placed for 1g vanc, 2g aztreonam, and 750mg  Levaquin.  Further antibiotics/pharmacy consults should be ordered by admitting physician if indicated.                       Thank you, Kara Mead 08/23/2017  8:30 PM

## 2017-08-23 NOTE — ED Provider Notes (Signed)
[place] DEPT Provider Note   CSN: [id number] Arrival date & time: 08/23/17  1816     History   Chief Complaint Chief Complaint  Patient presents with  . Back Pain  . Leg Pain    HPI William Stanley is a 72 y.o. male actively being treated for lung cancer with chemotherapy, history of lung collapse, DM 2, DVT, PE, hypertension, A. fib, edema of lower extremities who presents today for evaluation of back and leg pain.  He reports that he has been having back and leg pain since his chemo infusion on Monday.  He reports that his doctor called in a prescription for him for pain medicine and that he was supposed to go pick that up.  His friend and wife report that he had a temperature orally of 100.8.  They report that that was what caused him to come in today.  He reports that his legs do appear more swollen than usual.  His left leg hurts more than his right, and it feels like cramping.  He denies any increased difficulty breathing but his wife reports he is breathing harder than normal.    HPI  Past Medical History:  Diagnosis Date  . Atrial fibrillation ([place])   . Diabetes ([place])   . DVT (deep venous thrombosis) ([place])   . Edema of lower extremity   . Hypertension   . Irritable bowel syndrome (IBS)   . Lung collapse    LEFT LUNG, TWICE  . Pulmonary embolism [facility])     Patient Active Problem List   Diagnosis Date Noted  . Protein-calorie malnutrition ([place]) 08/20/2017  . Encounter for antineoplastic chemotherapy 07/20/2017  . Stage IV squamous cell carcinoma of left lung ([place]) 07/03/2017  . Lung mass 05/25/2017  . Diabetes mellitus type 2, uncomplicated ([place]) 51/76/1607  . Elevated PSA 10/23/2016  . Encounter for therapeutic drug monitoring 09/01/2013  . Bruit 06/16/2013  . Venous stasis ulcer ([place]) 01/14/2013  . Hypertension 12/12/2011  . Hyperlipidemia 12/12/2011  . Tobacco abuse 12/12/2011  . Warfarin anticoagulation 01/05/2011  . Atrial  fibrillation ([place]) 10/24/2010    Past Surgical History:  Procedure Laterality Date  . EYE SURGERY    . HERNIA REPAIR  1992  . IR THORACENTESIS ASP PLEURAL SPACE W/IMG GUIDE  06/18/2017  . VENOUS LIGATION SURGERY  1988  . VIDEO BRONCHOSCOPY Bilateral 06/25/2017   Procedure: VIDEO BRONCHOSCOPY WITH FLUORO;  Surgeon: Juanito Doom, MD;  Location: [facility] ENDOSCOPY;  Service: Cardiopulmonary;  Laterality: Bilateral;       Home Medications    Prior to Admission medications   Medication Sig Start Date End Date Taking? Authorizing Provider  ACCU-CHEK AVIVA PLUS test strip CHECK BLOOD SUGAR THREE TIMES DAILY 11/23/16  Yes Dena Billet B, PA-C  ACCU-CHEK SOFTCLIX LANCETS lancets CHECK BLOOD SUGAR THREE TIMES DAILY 11/23/16  Yes [name], PA-C  acetaminophen (TYLENOL) 500 MG tablet Take 1,000 mg daily as needed by mouth for moderate pain or headache.   Yes [provider]  Alcohol Swabs (ALCOHOL PADS) 70 % PADS Use to check blood sugar daily 11/21/16  Yes Dena Billet B, PA-C  bismuth subsalicylate (PEPTO BISMOL) 262 MG/15ML suspension Take 30 mLs every 6 (six) hours as needed by mouth for indigestion.   Yes [provider]  blood glucose meter kit and supplies KIT Dispense based on patient and insurance preference. Check blood sugar three times daily  (FOR ICD-10E11.5). 11/21/16  Yes [name], PA-C  cetirizine (ZYRTEC) 10 MG tablet TAKE 1 TABLET BY MOUTH EVERY DAY 08/22/17  Yes Dena Billet B, PA-C  docusate sodium (COLACE) 100 MG capsule Take 100 mg by mouth daily as needed for mild constipation or moderate constipation.    Yes [provider]  HYDROcodone-homatropine (HYCODAN) 5-1.5 MG/5ML syrup Take 5 mLs by mouth every 6 (six) hours as needed for cough. 07/25/17  Yes Juanito Doom, MD  metFORMIN (GLUCOPHAGE) 500 MG tablet TAKE 1 TABLET BY MOUTH TWICE DAILY WITH MEALS 06/25/17  Yes Dena Billet B, PA-C  polyethylene glycol (MIRALAX / GLYCOLAX) packet Take 17 g  by mouth daily as needed for mild constipation or moderate constipation.    Yes [provider]  pravastatin (PRAVACHOL) 20 MG tablet TAKE 1 TABLET BY MOUTH EVERY DAY 08/10/17  Yes Crenshaw, Denice Bors, MD  tamsulosin (FLOMAX) 0.4 MG CAPS capsule Take 1 capsule (0.4 mg total) by mouth daily. Patient taking differently: Take 0.4 mg at bedtime by mouth.  07/24/16  Yes Dixon, Lonie Peak, PA-C  VENTOLIN HFA 108 (90 Base) MCG/ACT inhaler INHALE 1-2 PUFFS INTO LUNGS AS NEEDED Patient taking differently: Inhale 1 to 2 puffs into the lungs every 6 hours as needed for shortness of breath 04/19/17  Yes Dena Billet B, PA-C  warfarin (COUMADIN) 5 MG tablet Take 1 to 1 and 1/2 tablets daily as  directed by [facility]. Patient taking differently: Take 2.5 mg by mouth daily at 6 PM.  05/28/17  Yes Crenshaw, Denice Bors, MD  ondansetron (ZOFRAN) 4 MG tablet Take 1 tablet (4 mg total) by mouth every 8 (eight) hours as needed for nausea or vomiting. Patient not taking: Reported on 08/20/2017 07/02/17   Gery Pray, MD  oxyCODONE-acetaminophen (PERCOCET/ROXICET) 5-325 MG tablet Take 1 tablet by mouth every 6 (six) hours as needed for severe pain. 08/23/17   Curt Bears, MD  prochlorperazine (COMPAZINE) 10 MG tablet Take 1 tablet (10 mg total) by mouth every 6 (six) hours as needed for nausea or vomiting. Patient not taking: Reported on 08/20/2017 07/03/17   Maryanna Shape, NP    Family History Family History  Problem Relation Age of Onset  . Heart attack Mother   . Heart failure Mother   . Arrhythmia Father   . Heart failure Father     Social History Social History   Tobacco Use  . Smoking status: Former Smoker    Packs/day: 0.25    Years: 15.00    Pack years: 3.75    Types: Cigarettes    Last attempt to quit: 07/07/2012    Years since quitting: 5.1  . Smokeless tobacco: Never Used  Substance Use Topics  . Alcohol use: No    Alcohol/week: 0.0 oz  . Drug use: No     Allergies   Fish  allergy and Penicillins   Review of Systems Review of Systems  Constitutional: Positive for fatigue and fever. Negative for chills.  HENT: Negative for ear pain and sore throat.   Eyes: Negative for pain and visual disturbance.  Respiratory: Positive for shortness of breath. Negative for cough.   Cardiovascular: Negative for chest pain and palpitations.  Gastrointestinal: Negative for abdominal pain, nausea and vomiting.  Genitourinary: Negative for dysuria and hematuria.  Musculoskeletal: Positive for back pain. Negative for arthralgias.       Bilateral leg swelling  Skin: Negative for color change and rash.  Neurological: Negative for seizures, syncope and headaches.  All other systems reviewed and are negative.  Physical Exam Updated Vital Signs BP (!) 104/58   Pulse 96   Temp 98.8 F (37.1 C) (Oral)   Resp (!) 42   SpO2 95%   Physical Exam  Constitutional: He is oriented to person, place, and time. He appears well-developed and well-nourished.  HENT:  Head: Normocephalic and atraumatic.  Eyes: Conjunctivae are normal.  Neck: Neck supple.  Cardiovascular: Normal rate and regular rhythm.  No murmur heard. +3 pitting edema bilaterally.  Pulmonary/Chest: Effort normal. Tachypnea noted. No respiratory distress. He has decreased breath sounds in the left upper field. He has no rhonchi. He has no rales.  Patient is frequently coughing up copious amounts of very foul-smelling sputum.  Abdominal: Soft. There is no tenderness.  Musculoskeletal: He exhibits no edema.  There is pain and tenderness to palpation over left superior/thoracic back.  Palpation here does not re-create his pain.  There is also reported pain in the lumbar area.  Palpation here does not re-create or exacerbate his pain.  Neurological: He is alert and oriented to person, place, and time.  Skin: Skin is warm and dry. No rash noted. He is not diaphoretic.  Psychiatric: He has a normal mood and affect.    Nursing note and vitals reviewed.    ED Treatments / Results  Labs (all labs ordered are listed, but only abnormal results are displayed) Labs Reviewed  CBC WITH DIFFERENTIAL/PLATELET - Abnormal; Notable for the following components:      Result Value   WBC 13.6 (*)    RBC 3.38 (*)    Hemoglobin 9.4 (*)    HCT 30.3 (*)    RDW 18.7 (*)    Platelets 496 (*)    Neutro Abs 13.0 (*)    Lymphs Abs 0.4 (*)    All other components within normal limits  COMPREHENSIVE METABOLIC PANEL - Abnormal; Notable for the following components:   Sodium 134 (*)    Chloride 97 (*)    Glucose, Bld 109 (*)    Calcium 8.8 (*)    Albumin 2.3 (*)    All other components within normal limits  PROTIME-INR - Abnormal; Notable for the following components:   Prothrombin Time 25.9 (*)    All other components within normal limits  URINE CULTURE  CULTURE, BLOOD (ROUTINE X 2)  CULTURE, BLOOD (ROUTINE X 2)  CULTURE, EXPECTORATED SPUTUM-ASSESSMENT  BRAIN NATRIURETIC PEPTIDE  URINALYSIS, ROUTINE W REFLEX MICROSCOPIC  I-STAT CG4 LACTIC ACID, ED  I-STAT CG4 LACTIC ACID, ED    EKG  EKG Interpretation None       Radiology Dg Chest Port 1 View  Result Date: 08/23/2017 CLINICAL DATA:  72 year old male with history of lung cancer. Patient is currently on chemotherapy and presents with fever. EXAM: PORTABLE CHEST 1 VIEW COMPARISON:  Chest radiograph dated 07/25/2017, CT dated 05/17/2017 and PET-CT dated 06/25/2017 FINDINGS: Left mid to lower lung field airspace densities appear relatively similar to the prior radiograph of 07/25/2017 and likely combination of mass, pleural effusion, and associated atelectasis versus infiltrate. Stable rounded lucency may represent a necrotic focus. The right lung is clear. There is no pneumothorax. Evaluation of the lung apices is limited due to superimposition of the patient's mandible. Stable cardiac silhouette. No acute osseous pathology. IMPRESSION: Left mid to lower lung  field airspace opacity relatively similar to prior radiograph and may represent combination of mass, atelectasis, and effusion. Pneumonia is not excluded. Clinical correlation is recommended. Electronically Signed   By: Anner Crete M.D.   On:  08/23/2017 21:24    Procedures Procedures (including critical care time)  Medications Ordered in ED Medications  iopamidol (ISOVUE-370) 76 % injection (not administered)  ipratropium-albuterol (DUONEB) 0.5-2.5 (3) MG/3ML nebulizer solution 3 mL (not administered)  sodium chloride 0.9 % bolus 1,000 mL (not administered)  iopamidol (ISOVUE-370) 76 % injection 100 mL (not administered)  levofloxacin (LEVAQUIN) IVPB 750 mg (0 mg Intravenous Stopped 08/23/17 2302)  aztreonam (AZACTAM) 2 g in dextrose 5 % 50 mL IVPB (0 g Intravenous Stopped 08/23/17 2108)  vancomycin (VANCOCIN) IVPB 1000 mg/200 mL premix (0 mg Intravenous Stopped 08/23/17 2152)  fentaNYL (SUBLIMAZE) injection 50 mcg (50 mcg Intravenous Given 08/23/17 2121)  fentaNYL (SUBLIMAZE) injection 50 mcg (50 mcg Intravenous Given 08/23/17 2331)     Initial Impression / Assessment and Plan / ED Course  I have reviewed the triage vital signs and the nursing notes.  Pertinent labs & imaging results that were available during my care of the patient were reviewed by me and considered in my medical decision making (see chart for details).  Clinical Course as of Aug 24 9  Thu Aug 23, 2017  2157 Sepsis re-evaluation completed.   [EH]    Clinical Course User Index [EH] Lorin Glass, PA-C   Nazair F Groll presents today for evaluation of fever at home.  He is actively receiving treatment for chemotherapy and had a oral temp of 100.8 F shortly prior to arrival.  He was afebrile here, however was tachycardic and tachypneic.  Code sepsis was called and patient was started on broad-spectrum antibiotics.  He denies any recent chills, reports that he has had back pain and pain in his bilateral  lower legs, left worse than right, since his chemo infusion on Monday.  1 view portable chest x-ray questionable for pneumonia.  Given history of lung cancer, in need to better characterize the consolidation CT angios chest PE was ordered.  Concern for PE increased given active cancer, along with tachycardia and tachypnea.  Additionally CT lumbar spine was ordered due to patient having significant pain there to evaluate for mets.  Patient's and was treated while in the department.  Hemoglobin 9.4, consistent with normal baseline.  Since his labs on Monday his WBC has increased with a significant increase in neutrophils (13.0).  Lactic not elevated.  Patient BP soft, however not hypotensive.    At shift change care was transferred to [facility] who will follow pending studies, re-evaulate and determine disposition.      Final Clinical Impressions(s) / ED Diagnoses   Final diagnoses:  Acute bilateral back pain, unspecified back location  Stage IV squamous cell carcinoma of left lung [facility])    ED Discharge Orders    None       Lorin Glass, PA-C 08/24/17 0011    Little, Wenda Overland, MD 08/24/17 1700

## 2017-08-23 NOTE — ED Triage Notes (Signed)
Patient c/o lower back and bilateral hip pain since yesterday. Hx lung cancer. Reports last chemo was on Monday.

## 2017-08-23 NOTE — Telephone Encounter (Signed)
Chemo Monday with taxol carbo-Aches all over since treatment-legs, back ,taking tylenol without  relief.

## 2017-08-23 NOTE — Progress Notes (Signed)
Pt gave permission to ask questions on nursing admission history in front of his visitors. He gave permission for his caregiver to help answer questions.  Lucius Conn BSN, RN-BC Admissions RN 08/23/2017 9:05 PM

## 2017-08-24 ENCOUNTER — Telehealth: Payer: Self-pay | Admitting: *Deleted

## 2017-08-24 ENCOUNTER — Encounter (HOSPITAL_COMMUNITY): Payer: Self-pay

## 2017-08-24 ENCOUNTER — Emergency Department (HOSPITAL_COMMUNITY): Payer: Medicare HMO

## 2017-08-24 DIAGNOSIS — J9601 Acute respiratory failure with hypoxia: Secondary | ICD-10-CM | POA: Diagnosis present

## 2017-08-24 DIAGNOSIS — J90 Pleural effusion, not elsewhere classified: Secondary | ICD-10-CM | POA: Diagnosis present

## 2017-08-24 DIAGNOSIS — M549 Dorsalgia, unspecified: Secondary | ICD-10-CM | POA: Diagnosis present

## 2017-08-24 DIAGNOSIS — A419 Sepsis, unspecified organism: Secondary | ICD-10-CM | POA: Diagnosis present

## 2017-08-24 DIAGNOSIS — N390 Urinary tract infection, site not specified: Secondary | ICD-10-CM | POA: Diagnosis present

## 2017-08-24 DIAGNOSIS — I4891 Unspecified atrial fibrillation: Secondary | ICD-10-CM | POA: Diagnosis present

## 2017-08-24 DIAGNOSIS — D701 Agranulocytosis secondary to cancer chemotherapy: Secondary | ICD-10-CM | POA: Diagnosis present

## 2017-08-24 DIAGNOSIS — E876 Hypokalemia: Secondary | ICD-10-CM | POA: Diagnosis present

## 2017-08-24 DIAGNOSIS — Z7189 Other specified counseling: Secondary | ICD-10-CM | POA: Diagnosis not present

## 2017-08-24 DIAGNOSIS — Y95 Nosocomial condition: Secondary | ICD-10-CM | POA: Diagnosis present

## 2017-08-24 DIAGNOSIS — E119 Type 2 diabetes mellitus without complications: Secondary | ICD-10-CM | POA: Diagnosis present

## 2017-08-24 DIAGNOSIS — J948 Other specified pleural conditions: Secondary | ICD-10-CM | POA: Diagnosis present

## 2017-08-24 DIAGNOSIS — D638 Anemia in other chronic diseases classified elsewhere: Secondary | ICD-10-CM | POA: Diagnosis present

## 2017-08-24 DIAGNOSIS — I2510 Atherosclerotic heart disease of native coronary artery without angina pectoris: Secondary | ICD-10-CM | POA: Diagnosis present

## 2017-08-24 DIAGNOSIS — Z8249 Family history of ischemic heart disease and other diseases of the circulatory system: Secondary | ICD-10-CM | POA: Diagnosis not present

## 2017-08-24 DIAGNOSIS — T451X5A Adverse effect of antineoplastic and immunosuppressive drugs, initial encounter: Secondary | ICD-10-CM | POA: Diagnosis present

## 2017-08-24 DIAGNOSIS — K589 Irritable bowel syndrome without diarrhea: Secondary | ICD-10-CM | POA: Diagnosis present

## 2017-08-24 DIAGNOSIS — D6481 Anemia due to antineoplastic chemotherapy: Secondary | ICD-10-CM | POA: Diagnosis present

## 2017-08-24 DIAGNOSIS — J189 Pneumonia, unspecified organism: Secondary | ICD-10-CM | POA: Diagnosis present

## 2017-08-24 DIAGNOSIS — C3492 Malignant neoplasm of unspecified part of left bronchus or lung: Secondary | ICD-10-CM | POA: Diagnosis not present

## 2017-08-24 DIAGNOSIS — C3432 Malignant neoplasm of lower lobe, left bronchus or lung: Secondary | ICD-10-CM | POA: Diagnosis present

## 2017-08-24 DIAGNOSIS — J860 Pyothorax with fistula: Secondary | ICD-10-CM | POA: Diagnosis not present

## 2017-08-24 DIAGNOSIS — J188 Other pneumonia, unspecified organism: Secondary | ICD-10-CM | POA: Diagnosis not present

## 2017-08-24 DIAGNOSIS — R609 Edema, unspecified: Secondary | ICD-10-CM | POA: Diagnosis not present

## 2017-08-24 DIAGNOSIS — J181 Lobar pneumonia, unspecified organism: Secondary | ICD-10-CM | POA: Diagnosis not present

## 2017-08-24 DIAGNOSIS — Z87891 Personal history of nicotine dependence: Secondary | ICD-10-CM | POA: Diagnosis not present

## 2017-08-24 DIAGNOSIS — I10 Essential (primary) hypertension: Secondary | ICD-10-CM | POA: Diagnosis present

## 2017-08-24 DIAGNOSIS — Z86718 Personal history of other venous thrombosis and embolism: Secondary | ICD-10-CM | POA: Diagnosis not present

## 2017-08-24 DIAGNOSIS — Z86711 Personal history of pulmonary embolism: Secondary | ICD-10-CM | POA: Diagnosis not present

## 2017-08-24 DIAGNOSIS — Z515 Encounter for palliative care: Secondary | ICD-10-CM | POA: Diagnosis present

## 2017-08-24 DIAGNOSIS — Z66 Do not resuscitate: Secondary | ICD-10-CM | POA: Diagnosis present

## 2017-08-24 LAB — URINALYSIS, ROUTINE W REFLEX MICROSCOPIC
BILIRUBIN URINE: NEGATIVE
Glucose, UA: NEGATIVE mg/dL
KETONES UR: NEGATIVE mg/dL
NITRITE: NEGATIVE
PH: 5 (ref 5.0–8.0)
PROTEIN: 30 mg/dL — AB
Specific Gravity, Urine: >1.046 — ABNORMAL HIGH (ref 1.005–1.030)

## 2017-08-24 LAB — EXPECTORATED SPUTUM ASSESSMENT W REFEX TO RESP CULTURE

## 2017-08-24 LAB — GLUCOSE, CAPILLARY: Glucose-Capillary: 104 mg/dL — ABNORMAL HIGH (ref 65–99)

## 2017-08-24 LAB — EXPECTORATED SPUTUM ASSESSMENT W GRAM STAIN, RFLX TO RESP C

## 2017-08-24 LAB — MRSA PCR SCREENING: MRSA by PCR: POSITIVE — AB

## 2017-08-24 MED ORDER — SODIUM CHLORIDE 0.9% FLUSH: 3.0000 mL | INTRAVENOUS | Status: DC | PRN

## 2017-08-24 MED ORDER — TRAZODONE HCL 50 MG PO TABS
50.0000 mg | ORAL_TABLET | Freq: Every evening | ORAL | Status: DC | PRN
  Administered 2017-08-24 – 2017-08-30 (×6): 50 mg via ORAL
  Filled 2017-08-24 (×6): qty 1

## 2017-08-24 MED ORDER — GUAIFENESIN ER 600 MG PO TB12
600.0000 mg | ORAL_TABLET | Freq: Two times a day (BID) | ORAL | Status: DC
  Administered 2017-08-24 – 2017-08-31 (×12): 600 mg via ORAL
  Filled 2017-08-24 (×13): qty 1

## 2017-08-24 MED ORDER — WARFARIN SODIUM 2.5 MG PO TABS
2.5000 mg | ORAL_TABLET | Freq: Every day | ORAL | Status: DC
  Administered 2017-08-24 – 2017-08-26 (×3): 2.5 mg via ORAL
  Filled 2017-08-24 (×4): qty 1

## 2017-08-24 MED ORDER — ONDANSETRON HCL 4 MG/2ML IJ SOLN
4.0000 mg | Freq: Four times a day (QID) | INTRAMUSCULAR | Status: DC | PRN
  Administered 2017-08-24 – 2017-08-29 (×2): 4 mg via INTRAVENOUS
  Filled 2017-08-24 (×2): qty 2

## 2017-08-24 MED ORDER — IOPAMIDOL (ISOVUE-370) INJECTION 76%
INTRAVENOUS | Status: AC
  Filled 2017-08-24: qty 100

## 2017-08-24 MED ORDER — SENNA 8.6 MG PO TABS
1.0000 | ORAL_TABLET | Freq: Two times a day (BID) | ORAL | Status: DC
  Administered 2017-08-24 – 2017-08-31 (×12): 8.6 mg via ORAL
  Filled 2017-08-24 (×13): qty 1

## 2017-08-24 MED ORDER — LORATADINE 10 MG PO TABS
10.0000 mg | ORAL_TABLET | Freq: Every day | ORAL | Status: DC
  Administered 2017-08-25 – 2017-08-31 (×7): 10 mg via ORAL
  Filled 2017-08-24 (×7): qty 1

## 2017-08-24 MED ORDER — SODIUM CHLORIDE 0.9 % IV SOLN: 250.0000 mL | INTRAVENOUS | Status: DC | PRN

## 2017-08-24 MED ORDER — DOCUSATE SODIUM 100 MG PO CAPS: 100.0000 mg | ORAL_CAPSULE | Freq: Every day | ORAL | Status: DC | PRN

## 2017-08-24 MED ORDER — IPRATROPIUM-ALBUTEROL 0.5-2.5 (3) MG/3ML IN SOLN
3.0000 mL | Freq: Four times a day (QID) | RESPIRATORY_TRACT | Status: DC
  Administered 2017-08-24 – 2017-08-26 (×9): 3 mL via RESPIRATORY_TRACT
  Filled 2017-08-24 (×10): qty 3

## 2017-08-24 MED ORDER — OXYCODONE-ACETAMINOPHEN 5-325 MG PO TABS
1.0000 | ORAL_TABLET | Freq: Four times a day (QID) | ORAL | Status: DC | PRN
  Administered 2017-08-24 – 2017-08-29 (×7): 1 via ORAL
  Filled 2017-08-24 (×8): qty 1

## 2017-08-24 MED ORDER — ALBUTEROL SULFATE (2.5 MG/3ML) 0.083% IN NEBU
2.5000 mg | INHALATION_SOLUTION | RESPIRATORY_TRACT | Status: DC | PRN
  Administered 2017-08-29 – 2017-08-30 (×3): 2.5 mg via RESPIRATORY_TRACT
  Filled 2017-08-24 (×3): qty 3

## 2017-08-24 MED ORDER — BISMUTH SUBSALICYLATE 262 MG/15ML PO SUSP
30.0000 mL | Freq: Four times a day (QID) | ORAL | Status: DC | PRN
  Filled 2017-08-24: qty 236

## 2017-08-24 MED ORDER — WARFARIN - PHARMACIST DOSING INPATIENT: Freq: Every day | Status: DC

## 2017-08-24 MED ORDER — POLYETHYLENE GLYCOL 3350 17 G PO PACK: 17.0000 g | PACK | Freq: Every day | ORAL | Status: DC | PRN

## 2017-08-24 MED ORDER — DEXTROSE 5 % IV SOLN
1.0000 g | Freq: Three times a day (TID) | INTRAVENOUS | Status: DC
  Administered 2017-08-24 – 2017-08-29 (×14): 1 g via INTRAVENOUS
  Filled 2017-08-24 (×15): qty 1

## 2017-08-24 MED ORDER — SODIUM CHLORIDE 0.9 % IV SOLN
INTRAVENOUS | Status: DC
  Administered 2017-08-24: 18:00:00 via INTRAVENOUS

## 2017-08-24 MED ORDER — IOPAMIDOL (ISOVUE-370) INJECTION 76%
100.0000 mL | Freq: Once | INTRAVENOUS | Status: AC | PRN
  Administered 2017-08-24: 100 mL via INTRAVENOUS

## 2017-08-24 MED ORDER — HYDROCODONE-HOMATROPINE 5-1.5 MG/5ML PO SYRP
5.0000 mL | ORAL_SOLUTION | Freq: Four times a day (QID) | ORAL | Status: DC | PRN
  Administered 2017-08-24 – 2017-08-28 (×5): 5 mL via ORAL
  Filled 2017-08-24 (×5): qty 5

## 2017-08-24 MED ORDER — DEXTROSE 5 % IV SOLN
2.0000 g | INTRAVENOUS | Status: AC
  Administered 2017-08-24: 2 g via INTRAVENOUS
  Filled 2017-08-24: qty 2

## 2017-08-24 MED ORDER — ACETAMINOPHEN 500 MG PO TABS: 1000.0000 mg | ORAL_TABLET | Freq: Every day | ORAL | Status: DC | PRN

## 2017-08-24 MED ORDER — ONDANSETRON HCL 4 MG PO TABS: 4.0000 mg | ORAL_TABLET | Freq: Four times a day (QID) | ORAL | Status: DC | PRN

## 2017-08-24 MED ORDER — TAMSULOSIN HCL 0.4 MG PO CAPS
0.4000 mg | ORAL_CAPSULE | Freq: Every day | ORAL | Status: DC
  Administered 2017-08-24 – 2017-08-31 (×8): 0.4 mg via ORAL
  Filled 2017-08-24 (×8): qty 1

## 2017-08-24 MED ORDER — AZTREONAM 2 G IJ SOLR
2.0000 g | Freq: Once | INTRAMUSCULAR | Status: DC
  Filled 2017-08-24: qty 2

## 2017-08-24 MED ORDER — PRAVASTATIN SODIUM 20 MG PO TABS
20.0000 mg | ORAL_TABLET | Freq: Every day | ORAL | Status: DC
  Administered 2017-08-24 – 2017-08-29 (×6): 20 mg via ORAL
  Filled 2017-08-24 (×6): qty 1

## 2017-08-24 MED ORDER — ACETAMINOPHEN 325 MG PO TABS
650.0000 mg | ORAL_TABLET | Freq: Four times a day (QID) | ORAL | Status: DC | PRN
  Administered 2017-08-26 – 2017-08-29 (×3): 650 mg via ORAL
  Filled 2017-08-24 (×3): qty 2

## 2017-08-24 MED ORDER — SODIUM CHLORIDE 0.9% FLUSH
3.0000 mL | Freq: Two times a day (BID) | INTRAVENOUS | Status: DC
  Administered 2017-08-25 – 2017-08-27 (×4): 3 mL via INTRAVENOUS

## 2017-08-24 MED ORDER — VANCOMYCIN HCL IN DEXTROSE 1-5 GM/200ML-% IV SOLN
1000.0000 mg | Freq: Two times a day (BID) | INTRAVENOUS | Status: DC
  Administered 2017-08-24 – 2017-08-28 (×9): 1000 mg via INTRAVENOUS
  Filled 2017-08-24 (×9): qty 200

## 2017-08-24 MED ORDER — ACETAMINOPHEN 650 MG RE SUPP: 650.0000 mg | Freq: Four times a day (QID) | RECTAL | Status: DC | PRN

## 2017-08-24 NOTE — Progress Notes (Signed)
A consult was received from an ED physician for vancomycin per pharmacy dosing.  The patient's profile has been reviewed for ht/wt/allergies/indication/available labs.   A one time order has been placed for vancomycin 1gm iv x1.  Further antibiotics/pharmacy consults should be ordered by admitting physician if indicated.                       Thank you, Nani Skillern Crowford 08/24/2017  2:01 AM

## 2017-08-24 NOTE — Plan of Care (Addendum)
patient is a 72 year old male with pmh lung cancer on chemo, DVT/PE, A. fib on chronic anticoagulation; who presented with complaints of back and leg pain.  Also noted to have fever up to 100.8 F.  Ultimately patient had CT scan of the abdomen which showed a postobstructive pneumonia with bronchopulmonary fistula.  PCCM consulted and will see the patient in a.m.  INR was noted to be therapeutic at 2.4.  Patient was empirically treated with antibiotics of Levaquin, vancomycin, and aztreonam.  Patient was noted to be tachypneic.  [facility] called to admit.  Orders placed for stepdown bed.

## 2017-08-24 NOTE — ED Provider Notes (Signed)
Patient with lung cancer.  Signed out to me pending CT PE study.  CT findings are concerning for worsening disease on are detailed below in the CT report.  I discussed the patient with Dr. Jimmy Footman and [place] with pulm/CC, who will have team consult on the patient in AM.  They recommend IV antibiotic therapy at this time and medicine admission.  Discussed case with Dr. Tamala Julian from [facility], who is appreciated for admitting the patient.  I discussed the findings with the patient.  Results for orders placed or performed during the hospital encounter of 08/23/17  CBC with Differential  Result Value Ref Range   WBC 13.6 (H) 4.0 - 10.5 K/uL   RBC 3.38 (L) 4.22 - 5.81 MIL/uL   Hemoglobin 9.4 (L) 13.0 - 17.0 g/dL   HCT 30.3 (L) 39.0 - 52.0 %   MCV 89.6 78.0 - 100.0 fL   MCH 27.8 26.0 - 34.0 pg   MCHC 31.0 30.0 - 36.0 g/dL   RDW 18.7 (H) 11.5 - 15.5 %   Platelets 496 (H) 150 - 400 K/uL   Neutrophils Relative % 96 %   Neutro Abs 13.0 (H) 1.7 - 7.7 K/uL   Lymphocytes Relative 3 %   Lymphs Abs 0.4 (L) 0.7 - 4.0 K/uL   Monocytes Relative 1 %   Monocytes Absolute 0.1 0.1 - 1.0 K/uL   Eosinophils Relative 0 %   Eosinophils Absolute 0.1 0.0 - 0.7 K/uL   Basophils Relative 0 %   Basophils Absolute 0.0 0.0 - 0.1 K/uL  Comprehensive metabolic panel  Result Value Ref Range   Sodium 134 (L) 135 - 145 mmol/L   Potassium 3.6 3.5 - 5.1 mmol/L   Chloride 97 (L) 101 - 111 mmol/L   CO2 27 22 - 32 mmol/L   Glucose, Bld 109 (H) 65 - 99 mg/dL   BUN 15 6 - 20 mg/dL   Creatinine, Ser 0.67 0.61 - 1.24 mg/dL   Calcium 8.8 (L) 8.9 - 10.3 mg/dL   Total Protein 7.3 6.5 - 8.1 g/dL   Albumin 2.3 (L) 3.5 - 5.0 g/dL   AST 29 15 - 41 U/L   ALT 20 17 - 63 U/L   Alkaline Phosphatase 68 38 - 126 U/L   Total Bilirubin 0.9 0.3 - 1.2 mg/dL   GFR calc non Af Amer >60 >60 mL/min   GFR calc Af Amer >60 >60 mL/min   Anion gap 10 5 - 15  Protime-INR  Result Value Ref Range   Prothrombin Time 25.9 (H) 11.4 - 15.2  seconds   INR 2.40   Brain natriuretic peptide  Result Value Ref Range   B Natriuretic Peptide 92.8 0.0 - 100.0 pg/mL  I-Stat CG4 Lactic Acid, ED  (not at  [facility])  Result Value Ref Range   Lactic Acid, Venous 1.67 0.5 - 1.9 mmol/L  I-Stat CG4 Lactic Acid, ED  (not at  [facility])  Result Value Ref Range   Lactic Acid, Venous 1.45 0.5 - 1.9 mmol/L   Dg Chest 2 View  Result Date: 07/25/2017 CLINICAL DATA:  72 year old male with a history of cough congestion and shortness of breath EXAM: CHEST  2 VIEW COMPARISON:  Plain film 07/04/2017, PET-CT 06/25/2017 FINDINGS: Cardiomediastinal silhouette likely unchanged with partial obscuration of the left heart border and left hemidiaphragm secondary to left lung/ pleural disease. Airspace opacity of nearly the entire left lower lobe persists with air-fluid levels centered at the posterior left lower lobe. Persisting left-sided pleural effusion. Right  lung relatively well aerated. Bilateral pleuroparenchymal thickening at the apices. No displaced fracture.  Degenerative changes of the spine. IMPRESSION: Relatively unchanged appearance of the chest x-ray, with left lower lobe consolidation and pulmonic air-fluid level compatible with necrotic focus. Superimposed pneumonia on this patient's known malignancy difficult to exclude. Unchanged small left pleural effusion. Right lung remains relatively well aerated. Electronically Signed   By: Corrie Mckusick D.O.   On: 07/25/2017 17:07   Ct Angio Chest Pe W/cm &/or Wo Cm  Result Date: 08/24/2017 CLINICAL DATA:  Left lower lobe lung cancer on chemotherapy. Dyspnea. EXAM: CT ANGIOGRAPHY CHEST WITH CONTRAST TECHNIQUE: Multidetector CT imaging of the chest was performed using the standard protocol during bolus administration of intravenous contrast. Multiplanar CT image reconstructions and MIPs were obtained to evaluate the vascular anatomy. CONTRAST:  145mL ISOVUE-370 IOPAMIDOL (ISOVUE-370) INJECTION 76% COMPARISON:  Chest  radiograph from one day prior. 06/25/2017 PET-CT and 05/17/2017 chest CT. FINDINGS: Cardiovascular: The study is low quality for the evaluation of pulmonary embolism, significantly limited by motion artifact, which largely precludes evaluation of the segmental and subsegmental pulmonary artery branches. There are no filling defects in the central, lobar or segmental pulmonary artery branches to suggest acute pulmonary embolism. Atherosclerotic nonaneurysmal thoracic aorta top-normal caliber main pulmonary artery (3.0 cm diameter), stable. Top-normal heart size. No significant pericardial fluid/thickening. Left main and 3 vessel coronary atherosclerosis. Mediastinum/Nodes: No discrete thyroid nodules. Unremarkable esophagus. No axillary adenopathy. Mildly enlarged 1.1 cm left upper paraesophageal mediastinal node (series 8/image 37), increased from 0.7 cm on 06/25/2017 PET-CT. Stable mildly enlarged 1.0 cm left subcarinal node (series 8/image 102). Mildly enlarged 1.0 cm right lower paraesophageal mediastinal node (series 8/image 154), increased from 0.8 cm on 06/25/2017. Mildly enlarged 1.0 cm left hilar node (series 8/image 90), decreased from 1.4 cm on 05/17/2017 chest CT. No right hilar adenopathy. Lungs/Pleura: No pneumothorax. No right pleural effusion. Small to moderate dependent basilar left hydropneumothorax with circumferential left pleural thickening and enhancement, slightly increased in size with new pneumothorax component since 06/25/2017 PET-CT. Patchy consolidation throughout the left upper lobe and left lower lobe, largely new in the left upper lobe and slightly worsened in the left lower lobe. Centrally necrotic left lower lobe lung mass measures approximately 5.9 x 4.7 cm and contains an air-fluid level (series 8/image 135), decreased from 7.2 x 6.6 cm on 06/25/2017 PET-CT using similar measurement technique. There is direct communication of the cavitary left lower lobe lung mass with a segmental  left lower lobe bronchus and with the left pleural space, compatible with a new bronchopleural fistula. Upper abdomen: Small hiatal hernia. Musculoskeletal: No aggressive appearing focal osseous lesions. Moderate thoracic spondylosis. Review of the MIP images confirms the above findings. IMPRESSION: 1. No evidence of pulmonary embolism on this limited motion degraded scan. 2. Cavitary left lower lobe lung mass is decreased in size. 3. 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 4. New/increased postobstructive pneumonia throughout the left lung. 5. Left hilar adenopathy is decreased. 6. Mediastinal adenopathy is mildly increased, cannot exclude progressive metastatic mediastinal adenopathy. 7. Left main and 3 vessel coronary atherosclerosis. Aortic Atherosclerosis (ICD10-I70.0). Electronically Signed   By: Ilona Sorrel M.D.   On: 08/24/2017 01:29   Ct Lumbar Spine Wo Contrast  Result Date: 08/24/2017 CLINICAL DATA:  72 y/o M; history of lung cancer receiving chemotherapy. Back and leg pain, question metastasis. EXAM: CT LUMBAR SPINE WITHOUT CONTRAST TECHNIQUE: Multidetector CT imaging of the  lumbar spine was performed without intravenous contrast administration. Multiplanar CT image reconstructions were also generated. COMPARISON:  None. FINDINGS: Segmentation: 5 lumbar type vertebrae. Alignment: Normal. Vertebrae: No acute fracture or focal pathologic process. Paraspinal and other soft tissues: IVC filter in situ. IVC filter prongs extend beyond the lumen of the IVC. Moderate calcific atherosclerosis of the abdominal aorta and iliac arteries. Disc levels: Mild lumbar spondylosis with multilevel disc and facet degenerative changes greatest at the L3 through S1 levels where there is mild foraminal stenosis. No significant canal stenosis. IMPRESSION: 1. No bony sclerotic or destructive lesion to suggest  metastatic disease. No acute fracture. 2. Mild lumbar spondylosis. 3. Aortic atherosclerosis. Electronically Signed   By: Kristine Garbe M.D.   On: 08/24/2017 01:05   Dg Chest Port 1 View  Result Date: 08/23/2017 CLINICAL DATA:  72 year old male with history of lung cancer. Patient is currently on chemotherapy and presents with fever. EXAM: PORTABLE CHEST 1 VIEW COMPARISON:  Chest radiograph dated 07/25/2017, CT dated 05/17/2017 and PET-CT dated 06/25/2017 FINDINGS: Left mid to lower lung field airspace densities appear relatively similar to the prior radiograph of 07/25/2017 and likely combination of mass, pleural effusion, and associated atelectasis versus infiltrate. Stable rounded lucency may represent a necrotic focus. The right lung is clear. There is no pneumothorax. Evaluation of the lung apices is limited due to superimposition of the patient's mandible. Stable cardiac silhouette. No acute osseous pathology. IMPRESSION: Left mid to lower lung field airspace opacity relatively similar to prior radiograph and may represent combination of mass, atelectasis, and effusion. Pneumonia is not excluded. Clinical correlation is recommended. Electronically Signed   By: Anner Crete M.D.   On: 08/23/2017 21:24      Montine Circle, PA-C 08/24/17 [place], MD 08/24/17 [phone]

## 2017-08-24 NOTE — H&P (Signed)
Patient Demographics:    William Stanley, is a 72 y.o. male  MRN: [id number]   DOB - 02-Mar-1946  Admit Date - 08/23/2017  Outpatient Primary MD for the patient is [name], PA-C   Assessment & Plan:    Principal Problem:   PNA (pneumonia) Active Problems:   Atrial fibrillation ([place])   Warfarin anticoagulation   Diabetes mellitus type 2, uncomplicated (HCC)   Stage IV squamous cell carcinoma of left lung (HCC)   Sepsis ([place])   UTI (urinary tract infection)    1)PNeumonia-patient has cough and leukocytosis, CT scan of the abdomen which showed a postobstructive pneumonia with bronchopulmonary fistula, empirically with IV vancomycin and cefepime pending blood and sputum culture results, pulmonologist/PCCM consult for possible bronchoscopy and to help with management requested, bronchiodilators and mucolytics as ordered  2)Social/Ethics-advanced directives discussed with patient and his sister at bedside, patient is a full code  3)Chronic Anticoagulation- continue Coumadin for anticoagulation due to history of DVT and atrial fibrillation, INR is currently therapeutic at 2.4  4)UTI-antibiotics as above #1 pending culture data  5)Sepsis-patient had tachycardia, leukocytosis and a fever of 100.8 on admission, patient had increased oxygen requirement, increased work of breathing, patient met sepsis criteria on admission, lactic acid was not elevated.  Sepsis is presumed to be secondary to combination of pneumonia and possible UTI, continue Vanco and aztreonam as above #1 pending blood, sputum and urine culture data  With History of - Reviewed by me  Past Medical History:  Diagnosis Date  . Atrial fibrillation ([place])   . Diabetes ([place])   . DVT (deep venous thrombosis) ([place])   . Edema of lower extremity   .  Hypertension   . Irritable bowel syndrome (IBS)   . Lung collapse    LEFT LUNG, TWICE  . Pulmonary embolism [facility])       Past Surgical History:  Procedure Laterality Date  . EYE SURGERY    . HERNIA REPAIR  1992  . IR THORACENTESIS ASP PLEURAL SPACE W/IMG GUIDE  06/18/2017  . VENOUS LIGATION SURGERY  1988  . VIDEO BRONCHOSCOPY Bilateral 06/25/2017   Procedure: VIDEO BRONCHOSCOPY WITH FLUORO;  Surgeon: Juanito Doom, MD;  Location: [facility] ENDOSCOPY;  Service: Cardiopulmonary;  Laterality: Bilateral;      Chief Complaint  Patient presents with  . Back Pain  . Leg Pain      HPI:    William Stanley  is a 72 y.o. male with h/o Lung Ca on ChemoRx, h/o DVT/PE, A. fib on chronic anticoagulation (Coumadin),  who presented with complaints of back and leg pain.    Patient is a poor historian, history obtained from patient's sister at bedside, , no vomiting no diarrhea, no frank chest pains , patient had tachycardia, leukocytosis and a fever of 100.8 on admission, patient had increased oxygen requirement, increased work of breathing, patient met sepsis criteria on admission, lactic acid was not elevated.  Sepsis is presumed  to be secondary to combination of pneumonia and possible UTI, Ultimately patient had CT scan of the abdomen which showed a postobstructive pneumonia with bronchopulmonary fistula.     In ED.... Patient received 71-year-old vancomycin,  Levaquin and azactam due to concerns about penicillin allergy      Review of systems:    In addition to the HPI above,   A full 12 point Review of 10 Systems was done, except as stated above, all other Review of 10 Systems were negative.    Social History:  Reviewed by me    Social History   Tobacco Use  . Smoking status: Former Smoker    Packs/day: 0.25    Years: 15.00    Pack years: 3.75    Types: Cigarettes    Last attempt to quit: 07/07/2012    Years since quitting: 5.1  . Smokeless tobacco: Never Used  Substance Use  Topics  . Alcohol use: No    Alcohol/week: 0.0 oz       Family History :  Reviewed by me    Family History  Problem Relation Age of Onset  . Heart attack Mother   . Heart failure Mother   . Arrhythmia Father   . Heart failure Father      Home Medications:   Prior to Admission medications   Medication Sig Start Date End Date Taking? Authorizing Provider  ACCU-CHEK AVIVA PLUS test strip CHECK BLOOD SUGAR THREE TIMES DAILY 11/23/16  Yes Dena Billet B, PA-C  ACCU-CHEK SOFTCLIX LANCETS lancets CHECK BLOOD SUGAR THREE TIMES DAILY 11/23/16  Yes [name], PA-C  acetaminophen (TYLENOL) 500 MG tablet Take 1,000 mg daily as needed by mouth for moderate pain or headache.   Yes [provider]  Alcohol Swabs (ALCOHOL PADS) 70 % PADS Use to check blood sugar daily 11/21/16  Yes Dena Billet B, PA-C  bismuth subsalicylate (PEPTO BISMOL) 262 MG/15ML suspension Take 30 mLs every 6 (six) hours as needed by mouth for indigestion.   Yes [provider]  blood glucose meter kit and supplies KIT Dispense based on patient and insurance preference. Check blood sugar three times daily  (FOR ICD-10E11.5). 11/21/16  Yes Dena Billet B, PA-C  cetirizine (ZYRTEC) 10 MG tablet TAKE 1 TABLET BY MOUTH EVERY DAY 08/22/17  Yes Dena Billet B, PA-C  docusate sodium (COLACE) 100 MG capsule Take 100 mg by mouth daily as needed for mild constipation or moderate constipation.    Yes [provider]  HYDROcodone-homatropine (HYCODAN) 5-1.5 MG/5ML syrup Take 5 mLs by mouth every 6 (six) hours as needed for cough. 07/25/17  Yes Juanito Doom, MD  metFORMIN (GLUCOPHAGE) 500 MG tablet TAKE 1 TABLET BY MOUTH TWICE DAILY WITH MEALS 06/25/17  Yes Dena Billet B, PA-C  polyethylene glycol (MIRALAX / GLYCOLAX) packet Take 17 g by mouth daily as needed for mild constipation or moderate constipation.    Yes [provider]  pravastatin (PRAVACHOL) 20 MG tablet TAKE 1 TABLET BY MOUTH EVERY DAY  08/10/17  Yes Crenshaw, Denice Bors, MD  tamsulosin (FLOMAX) 0.4 MG CAPS capsule Take 1 capsule (0.4 mg total) by mouth daily. Patient taking differently: Take 0.4 mg at bedtime by mouth.  07/24/16  Yes Dixon, Lonie Peak, PA-C  VENTOLIN HFA 108 (90 Base) MCG/ACT inhaler INHALE 1-2 PUFFS INTO LUNGS AS NEEDED Patient taking differently: Inhale 1 to 2 puffs into the lungs every 6 hours as needed for shortness of breath 04/19/17  Yes [name],  PA-C  warfarin (COUMADIN) 5 MG tablet Take 1 to 1 and 1/2 tablets daily as  directed by [facility]. Patient taking differently: Take 2.5 mg by mouth daily at 6 PM.  05/28/17  Yes Crenshaw, Denice Bors, MD  ondansetron (ZOFRAN) 4 MG tablet Take 1 tablet (4 mg total) by mouth every 8 (eight) hours as needed for nausea or vomiting. Patient not taking: Reported on 08/20/2017 07/02/17   Gery Pray, MD  oxyCODONE-acetaminophen (PERCOCET/ROXICET) 5-325 MG tablet Take 1 tablet by mouth every 6 (six) hours as needed for severe pain. 08/23/17   Curt Bears, MD  prochlorperazine (COMPAZINE) 10 MG tablet Take 1 tablet (10 mg total) by mouth every 6 (six) hours as needed for nausea or vomiting. Patient not taking: Reported on 08/20/2017 07/03/17   Maryanna Shape, NP     Allergies:     Allergies  Allergen Reactions  . Fish Allergy     Sweaty, upset stomach  . Penicillins     Blackouts  Has patient had a PCN reaction causing immediate rash, facial/tongue/throat swelling, SOB or lightheadedness with hypotension: Yes Has patient had a PCN reaction causing severe rash involving mucus membranes or skin necrosis: No Has patient had a PCN reaction that required hospitalization: No Has patient had a PCN reaction occurring within the last 10 years: No If all of the above answers are "NO", then may proceed with Cephalosporin use.      Physical Exam:   Vitals  Blood pressure 125/70, pulse 93, temperature 98 F (36.7 C), temperature source Oral, resp. rate 18, SpO2 99  %.  Physical Examination: General appearance - alert, ill appearing, and in no distress  Mental status - alert, oriented to person, place, and time,  Nose- [place] 4 L/min Eyes - sclera anicteric Neck - supple, no JVD elevation , Chest -diminished bilaterally, scattered rhonchi on the left, few scattered wheezes in upper lung fields heart - S1 and S2 normal, tachycardic heart rate 118 Abdomen - soft, nontender, nondistended, no masses or organomegaly Neurological - screening mental status exam normal, neck supple without rigidity, cranial nerves II through XII intact, DTR's normal and symmetric, generalized weakness without new focal deficits Extremities - no pedal edema noted, intact peripheral pulses Skin - warm, dry Psych-flat affect   Data Review:    CBC Recent Labs  Lab 08/20/17 0948 08/23/17 2021  WBC 10.0 13.6*  HGB 9.1* 9.4*  HCT 28.2* 30.3*  PLT 646* 496*  MCV 85.7 89.6  MCH 27.5 27.8  MCHC 32.2 31.0  RDW 19.7* 18.7*  LYMPHSABS 0.9 0.4*  MONOABS 1.1* 0.1  EOSABS 0.1 0.1  BASOSABS 0.1 0.0   ------------------------------------------------------------------------------------------------------------------  Chemistries  Recent Labs  Lab 08/20/17 0948 08/23/17 2021  NA 137 134*  K 3.5 3.6  CL 99 97*  CO2 28 27  GLUCOSE 106 109*  BUN 11 15  CREATININE 0.80 0.67  CALCIUM 8.8 8.8*  AST 27 29  ALT 25 20  ALKPHOS 81 68  BILITOT 0.6 0.9   ------------------------------------------------------------------------------------------------------------------ estimated creatinine clearance is 90.6 mL/min (by C-G formula based on SCr of 0.67 mg/dL). ------------------------------------------------------------------------------------------------------------------ No results for input(s): TSH, T4TOTAL, T3FREE, THYROIDAB in the last 72 hours.  Invalid input(s): FREET3   Coagulation profile Recent Labs  Lab 08/23/17 2040  INR 2.40    ------------------------------------------------------------------------------------------------------------------- No results for input(s): DDIMER in the last 72 hours. -------------------------------------------------------------------------------------------------------------------  Cardiac Enzymes No results for input(s): CKMB, TROPONINI, MYOGLOBIN in the last 168 hours.  Invalid input(s): CK ------------------------------------------------------------------------------------------------------------------  Component Value Date/Time   BNP 92.8 08/23/2017 2041   ---------------------------------------------------------------------------------------------------------------  Urinalysis    Component Value Date/Time   COLORURINE YELLOW 08/24/2017 0722   APPEARANCEUR HAZY (A) 08/24/2017 0722   LABSPEC >1.046 (H) 08/24/2017 0722   PHURINE 5.0 08/24/2017 0722   GLUCOSEU NEGATIVE 08/24/2017 0722   HGBUR MODERATE (A) 08/24/2017 0722   BILIRUBINUR NEGATIVE 08/24/2017 0722   KETONESUR NEGATIVE 08/24/2017 0722   PROTEINUR 30 (A) 08/24/2017 0722   NITRITE NEGATIVE 08/24/2017 0722   LEUKOCYTESUR LARGE (A) 08/24/2017 0722    ----------------------------------------------------------------------------------------------------------------   Imaging Results:    Ct Angio Chest Pe W/cm &/or Wo Cm  Result Date: 08/24/2017 CLINICAL DATA:  Left lower lobe lung cancer on chemotherapy. Dyspnea. EXAM: CT ANGIOGRAPHY CHEST WITH CONTRAST TECHNIQUE: Multidetector CT imaging of the chest was performed using the standard protocol during bolus administration of intravenous contrast. Multiplanar CT image reconstructions and MIPs were obtained to evaluate the vascular anatomy. CONTRAST:  163m ISOVUE-370 IOPAMIDOL (ISOVUE-370) INJECTION 76% COMPARISON:  Chest radiograph from one day prior. 06/25/2017 PET-CT and 05/17/2017 chest CT. FINDINGS: Cardiovascular: The study is low quality for the evaluation of  pulmonary embolism, significantly limited by motion artifact, which largely precludes evaluation of the segmental and subsegmental pulmonary artery branches. There are no filling defects in the central, lobar or segmental pulmonary artery branches to suggest acute pulmonary embolism. Atherosclerotic nonaneurysmal thoracic aorta top-normal caliber main pulmonary artery (3.0 cm diameter), stable. Top-normal heart size. No significant pericardial fluid/thickening. Left main and 3 vessel coronary atherosclerosis. Mediastinum/Nodes: No discrete thyroid nodules. Unremarkable esophagus. No axillary adenopathy. Mildly enlarged 1.1 cm left upper paraesophageal mediastinal node (series 8/image 37), increased from 0.7 cm on 06/25/2017 PET-CT. Stable mildly enlarged 1.0 cm left subcarinal node (series 8/image 102). Mildly enlarged 1.0 cm right lower paraesophageal mediastinal node (series 8/image 154), increased from 0.8 cm on 06/25/2017. Mildly enlarged 1.0 cm left hilar node (series 8/image 90), decreased from 1.4 cm on 05/17/2017 chest CT. No right hilar adenopathy. Lungs/Pleura: No pneumothorax. No right pleural effusion. Small to moderate dependent basilar left hydropneumothorax with circumferential left pleural thickening and enhancement, slightly increased in size with new pneumothorax component since 06/25/2017 PET-CT. Patchy consolidation throughout the left upper lobe and left lower lobe, largely new in the left upper lobe and slightly worsened in the left lower lobe. Centrally necrotic left lower lobe lung mass measures approximately 5.9 x 4.7 cm and contains an air-fluid level (series 8/image 135), decreased from 7.2 x 6.6 cm on 06/25/2017 PET-CT using similar measurement technique. There is direct communication of the cavitary left lower lobe lung mass with a segmental left lower lobe bronchus and with the left pleural space, compatible with a new bronchopleural fistula. Upper abdomen: Small hiatal hernia.  Musculoskeletal: No aggressive appearing focal osseous lesions. Moderate thoracic spondylosis. Review of the MIP images confirms the above findings. IMPRESSION: 1. No evidence of pulmonary embolism on this limited motion degraded scan. 2. Cavitary left lower lobe lung mass is decreased in size. 3. 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 4. New/increased postobstructive pneumonia throughout the left lung. 5. Left hilar adenopathy is decreased. 6. Mediastinal adenopathy is mildly increased, cannot exclude progressive metastatic mediastinal adenopathy. 7. Left main and 3 vessel coronary atherosclerosis. Aortic Atherosclerosis (ICD10-I70.0). Electronically Signed   By: JIlona SorrelM.D.   On: 08/24/2017 01:29   Ct Lumbar Spine Wo Contrast  Result Date: 08/24/2017 CLINICAL DATA:  72y/o  M; history of lung cancer receiving chemotherapy. Back and leg pain, question metastasis. EXAM: CT LUMBAR SPINE WITHOUT CONTRAST TECHNIQUE: Multidetector CT imaging of the lumbar spine was performed without intravenous contrast administration. Multiplanar CT image reconstructions were also generated. COMPARISON:  None. FINDINGS: Segmentation: 5 lumbar type vertebrae. Alignment: Normal. Vertebrae: No acute fracture or focal pathologic process. Paraspinal and other soft tissues: IVC filter in situ. IVC filter prongs extend beyond the lumen of the IVC. Moderate calcific atherosclerosis of the abdominal aorta and iliac arteries. Disc levels: Mild lumbar spondylosis with multilevel disc and facet degenerative changes greatest at the L3 through S1 levels where there is mild foraminal stenosis. No significant canal stenosis. IMPRESSION: 1. No bony sclerotic or destructive lesion to suggest metastatic disease. No acute fracture. 2. Mild lumbar spondylosis. 3. Aortic atherosclerosis. Electronically Signed   By: Kristine Garbe  M.D.   On: 08/24/2017 01:05   Dg Chest Port 1 View  Result Date: 08/23/2017 CLINICAL DATA:  72 year old male with history of lung cancer. Patient is currently on chemotherapy and presents with fever. EXAM: PORTABLE CHEST 1 VIEW COMPARISON:  Chest radiograph dated 07/25/2017, CT dated 05/17/2017 and PET-CT dated 06/25/2017 FINDINGS: Left mid to lower lung field airspace densities appear relatively similar to the prior radiograph of 07/25/2017 and likely combination of mass, pleural effusion, and associated atelectasis versus infiltrate. Stable rounded lucency may represent a necrotic focus. The right lung is clear. There is no pneumothorax. Evaluation of the lung apices is limited due to superimposition of the patient's mandible. Stable cardiac silhouette. No acute osseous pathology. IMPRESSION: Left mid to lower lung field airspace opacity relatively similar to prior radiograph and may represent combination of mass, atelectasis, and effusion. Pneumonia is not excluded. Clinical correlation is recommended. Electronically Signed   By: Anner Crete M.D.   On: 08/23/2017 21:24    Radiological Exams on Admission: Ct Angio Chest Pe W/cm &/or Wo Cm  Result Date: 08/24/2017 CLINICAL DATA:  Left lower lobe lung cancer on chemotherapy. Dyspnea. EXAM: CT ANGIOGRAPHY CHEST WITH CONTRAST TECHNIQUE: Multidetector CT imaging of the chest was performed using the standard protocol during bolus administration of intravenous contrast. Multiplanar CT image reconstructions and MIPs were obtained to evaluate the vascular anatomy. CONTRAST:  162m ISOVUE-370 IOPAMIDOL (ISOVUE-370) INJECTION 76% COMPARISON:  Chest radiograph from one day prior. 06/25/2017 PET-CT and 05/17/2017 chest CT. FINDINGS: Cardiovascular: The study is low quality for the evaluation of pulmonary embolism, significantly limited by motion artifact, which largely precludes evaluation of the segmental and subsegmental pulmonary artery branches. There are no  filling defects in the central, lobar or segmental pulmonary artery branches to suggest acute pulmonary embolism. Atherosclerotic nonaneurysmal thoracic aorta top-normal caliber main pulmonary artery (3.0 cm diameter), stable. Top-normal heart size. No significant pericardial fluid/thickening. Left main and 3 vessel coronary atherosclerosis. Mediastinum/Nodes: No discrete thyroid nodules. Unremarkable esophagus. No axillary adenopathy. Mildly enlarged 1.1 cm left upper paraesophageal mediastinal node (series 8/image 37), increased from 0.7 cm on 06/25/2017 PET-CT. Stable mildly enlarged 1.0 cm left subcarinal node (series 8/image 102). Mildly enlarged 1.0 cm right lower paraesophageal mediastinal node (series 8/image 154), increased from 0.8 cm on 06/25/2017. Mildly enlarged 1.0 cm left hilar node (series 8/image 90), decreased from 1.4 cm on 05/17/2017 chest CT. No right hilar adenopathy. Lungs/Pleura: No pneumothorax. No right pleural effusion. Small to moderate dependent basilar left hydropneumothorax with circumferential left pleural thickening and enhancement, slightly increased in size with new pneumothorax component since 06/25/2017 PET-CT. Patchy consolidation throughout the left upper  lobe and left lower lobe, largely new in the left upper lobe and slightly worsened in the left lower lobe. Centrally necrotic left lower lobe lung mass measures approximately 5.9 x 4.7 cm and contains an air-fluid level (series 8/image 135), decreased from 7.2 x 6.6 cm on 06/25/2017 PET-CT using similar measurement technique. There is direct communication of the cavitary left lower lobe lung mass with a segmental left lower lobe bronchus and with the left pleural space, compatible with a new bronchopleural fistula. Upper abdomen: Small hiatal hernia. Musculoskeletal: No aggressive appearing focal osseous lesions. Moderate thoracic spondylosis. Review of the MIP images confirms the above findings. IMPRESSION: 1. No evidence of  pulmonary embolism on this limited motion degraded scan. 2. Cavitary left lower lobe lung mass is decreased in size. 3. 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 4. New/increased postobstructive pneumonia throughout the left lung. 5. Left hilar adenopathy is decreased. 6. Mediastinal adenopathy is mildly increased, cannot exclude progressive metastatic mediastinal adenopathy. 7. Left main and 3 vessel coronary atherosclerosis. Aortic Atherosclerosis (ICD10-I70.0). Electronically Signed   By: Ilona Sorrel M.D.   On: 08/24/2017 01:29   Ct Lumbar Spine Wo Contrast  Result Date: 08/24/2017 CLINICAL DATA:  72 y/o M; history of lung cancer receiving chemotherapy. Back and leg pain, question metastasis. EXAM: CT LUMBAR SPINE WITHOUT CONTRAST TECHNIQUE: Multidetector CT imaging of the lumbar spine was performed without intravenous contrast administration. Multiplanar CT image reconstructions were also generated. COMPARISON:  None. FINDINGS: Segmentation: 5 lumbar type vertebrae. Alignment: Normal. Vertebrae: No acute fracture or focal pathologic process. Paraspinal and other soft tissues: IVC filter in situ. IVC filter prongs extend beyond the lumen of the IVC. Moderate calcific atherosclerosis of the abdominal aorta and iliac arteries. Disc levels: Mild lumbar spondylosis with multilevel disc and facet degenerative changes greatest at the L3 through S1 levels where there is mild foraminal stenosis. No significant canal stenosis. IMPRESSION: 1. No bony sclerotic or destructive lesion to suggest metastatic disease. No acute fracture. 2. Mild lumbar spondylosis. 3. Aortic atherosclerosis. Electronically Signed   By: Kristine Garbe M.D.   On: 08/24/2017 01:05   Dg Chest Port 1 View  Result Date: 08/23/2017 CLINICAL DATA:  72 year old male with history of lung cancer. Patient is currently on chemotherapy  and presents with fever. EXAM: PORTABLE CHEST 1 VIEW COMPARISON:  Chest radiograph dated 07/25/2017, CT dated 05/17/2017 and PET-CT dated 06/25/2017 FINDINGS: Left mid to lower lung field airspace densities appear relatively similar to the prior radiograph of 07/25/2017 and likely combination of mass, pleural effusion, and associated atelectasis versus infiltrate. Stable rounded lucency may represent a necrotic focus. The right lung is clear. There is no pneumothorax. Evaluation of the lung apices is limited due to superimposition of the patient's mandible. Stable cardiac silhouette. No acute osseous pathology. IMPRESSION: Left mid to lower lung field airspace opacity relatively similar to prior radiograph and may represent combination of mass, atelectasis, and effusion. Pneumonia is not excluded. Clinical correlation is recommended. Electronically Signed   By: Anner Crete M.D.   On: 08/23/2017 21:24    DVT Prophylaxis -SCD  /coumadin AM Labs Ordered, also please review Full Orders  Family Communication: Admission, patients condition and plan of care including tests being ordered have been discussed with the patient and sister who indicate understanding and agree with the plan   Code Status - Full Code  Likely DC to  home  Condition   stable  Roxan Hockey M.D on 08/24/2017 at 6:30 PM   Between 7am to 7pm - Pager - [phone] After 7pm go to [email or web address] - password [email or web address]  [facility] - Office  [phone]  Voice Recognition Viviann Spare dictation system was used to create this note, attempts have been made to correct errors. Please contact the author with questions and/or clarifications.

## 2017-08-24 NOTE — Telephone Encounter (Signed)
Wife called to to inform us that pt was in the hospital & he has PNA & is being treated. Will cancel pt's appt for today.

## 2017-08-24 NOTE — Progress Notes (Signed)
ANTICOAGULATION CONSULT NOTE - Initial Consult  Pharmacy Consult for Warfarin Indication: Hx DVT/PE, afib  Allergies  Allergen Reactions  . Fish Allergy     Sweaty, upset stomach  . Penicillins     Blackouts  Has patient had a PCN reaction causing immediate rash, facial/tongue/throat swelling, SOB or lightheadedness with hypotension: Yes Has patient had a PCN reaction causing severe rash involving mucus membranes or skin necrosis: No Has patient had a PCN reaction that required hospitalization: No Has patient had a PCN reaction occurring within the last 10 years: No If all of the above answers are "NO", then may proceed with Cephalosporin use.     Patient Measurements:   Heparin Dosing Weight:   Vital Signs: BP: 120/65 (01/18 0900) Pulse Rate: 112 (01/18 0900)  Labs: Recent Labs    08/23/17 2021 08/23/17 2040  HGB 9.4*  --   HCT 30.3*  --   PLT 496*  --   LABPROT  --  25.9*  INR  --  2.40  CREATININE 0.67  --     Estimated Creatinine Clearance: 90.6 mL/min (by C-G formula based on SCr of 0.67 mg/dL).   Medical History: Past Medical History:  Diagnosis Date  . Atrial fibrillation ([place])   . Diabetes ([place])   . DVT (deep venous thrombosis) ([place])   . Edema of lower extremity   . Hypertension   . Irritable bowel syndrome (IBS)   . Lung collapse    LEFT LUNG, TWICE  . Pulmonary embolism ([facility])     Medications:  Scheduled:  . iopamidol      . iopamidol       Infusions:  . ceFEPime (MAXIPIME) IV    . vancomycin     PRN:   Assessment: 72 yo male on chronic warfarin for hx afib and DVT/PE.  Home dose reported as 2.5mg  PO daily.  INR therapeutic (2.4) on admission.  Pharmacy consulted to continue dosing on admission.  Goal of Therapy:  INR 2-3 Monitor platelets by anticoagulation protocol: Yes   Plan:  Continue warfarin 2.5mg  PO daily at 18:00 Daily PT/INR  Peggyann Juba, PharmD, BCPS Pager: [phone] 08/24/2017,9:57 AM

## 2017-08-24 NOTE — ED Notes (Signed)
Hospitalist at the bedside. Hospitalist had conversation with patient regarding code status and patient wishes.

## 2017-08-24 NOTE — Progress Notes (Signed)
Pharmacy Antibiotic Note  William Stanley is a 72 y.o. male with lung cancer on chemotherapy, most recent treatment on Monday, admitted on 08/23/2017 with sepsis, likely source pneumonia.  Pharmacy has been consulted for vancomycin and cefepime dosing.  Last night in ED, received Vancomycin 1g, Aztreonam 2g and Levofloxacin 750mg  x 1 dose each.  Self-reported fever 100.8 WBC 13.6 SCr 0.67, CrCl 90 ml/min Lactate 1.45  Plan: Vancomycin 1g IV q12h (estimated AUC 492) Check peak and trough at steady state (goal 400-500) Cefepime 1g IV q8h Follow up renal function & cultures    Temp (24hrs), Avg:98.8 F (37.1 C), Min:98.8 F (37.1 C), Max:98.8 F (37.1 C)  Recent Labs  Lab 08/20/17 0948 08/23/17 2021 08/23/17 2037 08/23/17 2240  WBC 10.0 13.6*  --   --   CREATININE 0.80 0.67  --   --   LATICACIDVEN  --   --  1.67 1.45    Estimated Creatinine Clearance: 90.6 mL/min (by C-G formula based on SCr of 0.67 mg/dL).    Allergies  Allergen Reactions  . Fish Allergy     Sweaty, upset stomach  . Penicillins     Blackouts  Has patient had a PCN reaction causing immediate rash, facial/tongue/throat swelling, SOB or lightheadedness with hypotension: Yes Has patient had a PCN reaction causing severe rash involving mucus membranes or skin necrosis: No Has patient had a PCN reaction that required hospitalization: No Has patient had a PCN reaction occurring within the last 10 years: No If all of the above answers are "NO", then may proceed with Cephalosporin use.     Antimicrobials this admission:  1/17 Aztreonam x 1 1/17 Levaquin x 1 1/17 Vancomycin >> 1/18 Cefepime >>  Dose adjustments this admission:   Microbiology results:  1/17 BCx: sent 1/18 UCx: sent Sputum: ordered 1/18 MRSA PCR: sent  Thank you for allowing pharmacy to be a part of this patient's care.  Peggyann Juba, PharmD, BCPS Pager: [phone] 08/24/2017 9:52 AM

## 2017-08-24 NOTE — ED Notes (Signed)
Patient transferred from bed to recliner. Work of breathing decreased and patient reports increased comfort. Patient instructed to use call light if he needs to get out of bed for any reason for risk of falling.

## 2017-08-25 ENCOUNTER — Other Ambulatory Visit: Payer: Self-pay

## 2017-08-25 DIAGNOSIS — E119 Type 2 diabetes mellitus without complications: Secondary | ICD-10-CM

## 2017-08-25 DIAGNOSIS — J860 Pyothorax with fistula: Secondary | ICD-10-CM

## 2017-08-25 DIAGNOSIS — J189 Pneumonia, unspecified organism: Secondary | ICD-10-CM

## 2017-08-25 LAB — CBC
HCT: 24 % — ABNORMAL LOW (ref 39.0–52.0)
HCT: 23.4 % — ABNORMAL LOW (ref 39.0–52.0)
HEMOGLOBIN: 7.4 g/dL — AB (ref 13.0–17.0)
Hemoglobin: 7.1 g/dL — ABNORMAL LOW (ref 13.0–17.0)
MCH: 27.9 pg (ref 26.0–34.0)
MCH: 27.6 pg (ref 26.0–34.0)
MCHC: 30.8 g/dL (ref 30.0–36.0)
MCHC: 30.3 g/dL (ref 30.0–36.0)
MCV: 90.6 fL (ref 78.0–100.0)
MCV: 91.1 fL (ref 78.0–100.0)
PLATELETS: 362 10*3/uL (ref 150–400)
PLATELETS: 334 10*3/uL (ref 150–400)
RBC: 2.65 MIL/uL — ABNORMAL LOW (ref 4.22–5.81)
RBC: 2.57 MIL/uL — AB (ref 4.22–5.81)
RDW: 18.9 % — ABNORMAL HIGH (ref 11.5–15.5)
RDW: 18.7 % — ABNORMAL HIGH (ref 11.5–15.5)
WBC: 4 10*3/uL (ref 4.0–10.5)
WBC: 3.9 10*3/uL — AB (ref 4.0–10.5)

## 2017-08-25 LAB — BASIC METABOLIC PANEL
ANION GAP: 7 (ref 5–15)
BUN: 10 mg/dL (ref 6–20)
CALCIUM: 8.2 mg/dL — AB (ref 8.9–10.3)
CO2: 28 mmol/L (ref 22–32)
Chloride: 100 mmol/L — ABNORMAL LOW (ref 101–111)
Creatinine, Ser: 0.56 mg/dL — ABNORMAL LOW (ref 0.61–1.24)
GLUCOSE: 108 mg/dL — AB (ref 65–99)
Potassium: 3.2 mmol/L — ABNORMAL LOW (ref 3.5–5.1)
Sodium: 135 mmol/L (ref 135–145)

## 2017-08-25 LAB — URINE CULTURE

## 2017-08-25 LAB — ABO/RH: ABO/RH(D): O POS

## 2017-08-25 LAB — PREPARE RBC (CROSSMATCH)

## 2017-08-25 LAB — PROTIME-INR
INR: 2.1
PROTHROMBIN TIME: 23.4 s — AB (ref 11.4–15.2)

## 2017-08-25 MED ORDER — SODIUM CHLORIDE 0.9 % IV SOLN
Freq: Once | INTRAVENOUS | Status: AC
  Administered 2017-08-25: 22:00:00 via INTRAVENOUS

## 2017-08-25 MED ORDER — POTASSIUM CHLORIDE CRYS ER 20 MEQ PO TBCR
40.0000 meq | EXTENDED_RELEASE_TABLET | Freq: Once | ORAL | Status: AC
  Administered 2017-08-25: 40 meq via ORAL
  Filled 2017-08-25: qty 2

## 2017-08-25 NOTE — Progress Notes (Signed)
ANTICOAGULATION CONSULT NOTE - Follow Up  Pharmacy Consult for Warfarin Indication: Hx DVT/PE, afib  Allergies  Allergen Reactions  . Fish Allergy     Sweaty, upset stomach  . Penicillins     Blackouts  Has patient had a PCN reaction causing immediate rash, facial/tongue/throat swelling, SOB or lightheadedness with hypotension: Yes Has patient had a PCN reaction causing severe rash involving mucus membranes or skin necrosis: No Has patient had a PCN reaction that required hospitalization: No Has patient had a PCN reaction occurring within the last 10 years: No If all of the above answers are "NO", then may proceed with Cephalosporin use.     Patient Measurements:   Heparin Dosing Weight:   Vital Signs: Temp: 99.3 F (37.4 C) (01/19 0616) Temp Source: Oral (01/19 0616) BP: 117/59 (01/19 0616) Pulse Rate: 80 (01/19 0616)  Labs: Recent Labs    08/23/17 2021 08/23/17 2040 08/25/17 0656  HGB 9.4*  --  7.4*  HCT 30.3*  --  24.0*  PLT 496*  --  362  LABPROT  --  25.9* 23.4*  INR  --  2.40 2.10  CREATININE 0.67  --  0.56*    Estimated Creatinine Clearance: 90.6 mL/min (A) (by C-G formula based on SCr of 0.56 mg/dL (L)).   Medical History: Past Medical History:  Diagnosis Date  . Atrial fibrillation ([place])   . Diabetes ([place])   . DVT (deep venous thrombosis) ([place])   . Edema of lower extremity   . Hypertension   . Irritable bowel syndrome (IBS)   . Lung collapse    LEFT LUNG, TWICE  . Pulmonary embolism ([facility])     Medications:  Scheduled:  . guaiFENesin  600 mg Oral BID  . ipratropium-albuterol  3 mL Nebulization Q6H  . loratadine  10 mg Oral Daily  . pravastatin  20 mg Oral Daily  . senna  1 tablet Oral BID  . sodium chloride flush  3 mL Intravenous Q12H  . tamsulosin  0.4 mg Oral Daily  . warfarin  2.5 mg Oral q1800  . Warfarin - Pharmacist Dosing Inpatient   Does not apply q1800   Infusions:  . sodium chloride    . ceFEPime (MAXIPIME) IV 1 g (08/25/17  0213)  . vancomycin 1,000 mg (08/24/17 2226)   PRN:   Assessment: 72 yo male on chronic warfarin for hx afib and DVT/PE.  Home dose reported as 2.5mg  PO daily.  INR therapeutic (2.4) on admission.  Pharmacy consulted to continue dosing on admission 1/18  Today, 08/25/17  INR therapeutic - received warfarin 2.5mg  x 1 as inpatient  CBC: Hgb 7.4 down, Plt 362 down - continue to monitor  No reported bleeding  Goal of Therapy:  INR 2-3 Monitor platelets by anticoagulation protocol: Yes   Plan:  1) Continue warfarin home regimen as above - 2.5mg  today at 1800 2) Daily PT/INR   Adrian Saran, PharmD, BCPS Pager [phone] 08/25/2017 9:16 AM

## 2017-08-25 NOTE — Progress Notes (Signed)
Alerted MD to H&H 7.4/24.0 Will await any instruction.

## 2017-08-25 NOTE — Consult Note (Signed)
PULMONARY / CRITICAL CARE MEDICINE   Name: William Stanley MRN: [id number] DOB: [birth year] PCP [name], PA-C LOS 1 as of 08/25/2017     ADMISSION DATE:  08/23/2017 CONSULTATION DATE:  *08/25/17  REFERRING MD:  Roxan Hockey  CHIEF COMPLAINT:  BP fistula  HISTORY OF PRESENT ILLNESS:   72 year old male who in October/November 2018 was diagnosed with stage IV non-small cell lung cancer by Dr. Roselie Awkward with a huge left lower lobe mass.  He has seen Dr. Julien Nordmann and is getting carboplatin Taxol chemotherapy.  He try to get radiation but could not look tolerate lying down flat.  His current history is gained from him, review of the chart and talking to his girlfriend.  The best I can gather is August 20, 2017 he did get his second cycle of chemotherapy and then he went home started feeling fatigued and then by Wednesday, August 22, 2017 he started picking up fever and cough and phlegm and therefore he got admitted.  He is being treated for pneumonia and is beginning to feel better.  CT scan of the chest shows that the left lower lobe mass although might be shrinking in size has cavitated and discussed of bronchopleural fistula although there is no effusion documented.  I personally visualized the CT scan and agree with the findings.  Pulmonary has now been consulted because of this.  In talking to his bedside nurse and his girlfriend it appears his baseline ECOG might be 3 that has been noticing shortness of breath with exertion and is needing a walker.  Most of the time he is sedentary.  PAST MEDICAL HISTORY :  He  has a past medical history of Atrial fibrillation ([place]), Diabetes ([place]), DVT (deep venous thrombosis) ([place]), Edema of lower extremity, Hypertension, Irritable bowel syndrome (IBS), Lung collapse, and Pulmonary embolism ([place]).  PAST SURGICAL HISTORY: He  has a past surgical history that includes VENOUS LIGATION SURGERY (1988); Hernia repair (1992); Eye surgery; IR  THORACENTESIS ASP PLEURAL SPACE W/IMG GUIDE (06/18/2017); and Video bronchoscopy (Bilateral, 06/25/2017).  Allergies  Allergen Reactions  . Fish Allergy     Sweaty, upset stomach  . Penicillins     Blackouts  Has patient had a PCN reaction causing immediate rash, facial/tongue/throat swelling, SOB or lightheadedness with hypotension: Yes Has patient had a PCN reaction causing severe rash involving mucus membranes or skin necrosis: No Has patient had a PCN reaction that required hospitalization: No Has patient had a PCN reaction occurring within the last 10 years: No If all of the above answers are "NO", then may proceed with Cephalosporin use.     No current facility-administered medications on file prior to encounter.    Current Outpatient Medications on File Prior to Encounter  Medication Sig  . ACCU-CHEK AVIVA PLUS test strip CHECK BLOOD SUGAR THREE TIMES DAILY  . ACCU-CHEK SOFTCLIX LANCETS lancets CHECK BLOOD SUGAR THREE TIMES DAILY  . acetaminophen (TYLENOL) 500 MG tablet Take 1,000 mg daily as needed by mouth for moderate pain or headache.  . Alcohol Swabs (ALCOHOL PADS) 70 % PADS Use to check blood sugar daily  . bismuth subsalicylate (PEPTO BISMOL) 262 MG/15ML suspension Take 30 mLs every 6 (six) hours as needed by mouth for indigestion.  . blood glucose meter kit and supplies KIT Dispense based on patient and insurance preference. Check blood sugar three times daily  (FOR ICD-10E11.5).  . cetirizine (ZYRTEC) 10 MG tablet TAKE 1 TABLET BY MOUTH EVERY DAY  . docusate sodium (  COLACE) 100 MG capsule Take 100 mg by mouth daily as needed for mild constipation or moderate constipation.   Marland Kitchen HYDROcodone-homatropine (HYCODAN) 5-1.5 MG/5ML syrup Take 5 mLs by mouth every 6 (six) hours as needed for cough.  . metFORMIN (GLUCOPHAGE) 500 MG tablet TAKE 1 TABLET BY MOUTH TWICE DAILY WITH MEALS  . polyethylene glycol (MIRALAX / GLYCOLAX) packet Take 17 g by mouth daily as needed for mild  constipation or moderate constipation.   . pravastatin (PRAVACHOL) 20 MG tablet TAKE 1 TABLET BY MOUTH EVERY DAY  . tamsulosin (FLOMAX) 0.4 MG CAPS capsule Take 1 capsule (0.4 mg total) by mouth daily. (Patient taking differently: Take 0.4 mg at bedtime by mouth. )  . VENTOLIN HFA 108 (90 Base) MCG/ACT inhaler INHALE 1-2 PUFFS INTO LUNGS AS NEEDED (Patient taking differently: Inhale 1 to 2 puffs into the lungs every 6 hours as needed for shortness of breath)  . warfarin (COUMADIN) 5 MG tablet Take 1 to 1 and 1/2 tablets daily as  directed by [facility]. (Patient taking differently: Take 2.5 mg by mouth daily at 6 PM. )  . ondansetron (ZOFRAN) 4 MG tablet Take 1 tablet (4 mg total) by mouth every 8 (eight) hours as needed for nausea or vomiting. (Patient not taking: Reported on 08/20/2017)  . oxyCODONE-acetaminophen (PERCOCET/ROXICET) 5-325 MG tablet Take 1 tablet by mouth every 6 (six) hours as needed for severe pain.  Marland Kitchen prochlorperazine (COMPAZINE) 10 MG tablet Take 1 tablet (10 mg total) by mouth every 6 (six) hours as needed for nausea or vomiting. (Patient not taking: Reported on 08/20/2017)    FAMILY HISTORY:  His indicated that his mother is deceased. He indicated that his father is deceased. He indicated that his sister is alive. He indicated that his brother is alive.   SOCIAL HISTORY: He  reports that he quit smoking about 5 years ago. His smoking use included cigarettes. He has a 3.75 pack-year smoking history. he has never used smokeless tobacco. He reports that he does not drink alcohol or use drugs.    VITAL SIGNS: BP (!) 117/59 (BP Location: Right Arm)   Pulse 80   Temp 99.3 F (37.4 C) (Oral)   Resp 20   SpO2 94%   HEMODYNAMICS:    VENTILATOR SETTINGS:    INTAKE / OUTPUT: I/O last 3 completed shifts: In: 2342.5 [I.V.:1842.5; IV Piggyback:500] Out: 325 [Urine:325]     EXAM  General Appearance:   Frail male looking comfortable in the bed  Head:     Normocephalic, without obvious abnormality, atraumatic  Eyes:    PERRL -yes, conjunctiva/corneas -clear      Ears:    Normal external ear canals, both ears  Nose:   NG tube -no but has nasal cannula oxygen  Throat:  ETT TUBE -no, OG tube -no  Neck:   Supple,  No enlargement/tenderness/nodules     Lungs:     Clear to auscultation bilaterally,   Chest wall:    No deformity  Heart:    S1 and S2 normal, no murmur, CVP -no.  Pressors -no  Abdomen:     Soft, no masses, no organomegaly  Genitalia:    Not done  Rectal:   not done  Extremities:   Extremities-intact     Skin:   Intact in exposed areas . Sacral area -no reports of decub     Neurologic:   Sedation -none-> RASS -+1. Moves all 4s -yes. CAM-ICU -negative for delirium. Orientation -x3+  LABS  PULMONARY No results for input(s): PHART, PCO2ART, PO2ART, HCO3, TCO2, O2SAT in the last 168 hours.  Invalid input(s): PCO2, PO2  CBC Recent Labs  Lab 08/23/17 2021 08/25/17 0656 08/25/17 1217  HGB 9.4* 7.4* 7.1*  HCT 30.3* 24.0* 23.4*  WBC 13.6* 4.0 3.9*  PLT 496* 362 334    COAGULATION Recent Labs  Lab 08/23/17 2040 08/25/17 0656  INR 2.40 2.10    CARDIAC  No results for input(s): TROPONINI in the last 168 hours. No results for input(s): PROBNP in the last 168 hours.   CHEMISTRY Recent Labs  Lab 08/20/17 0948 08/23/17 2021 08/25/17 0656  NA 137 134* 135  K 3.5 3.6 3.2*  CL 99 97* 100*  CO2 _0 GLUCOSE 106 109* 108*  BUN _1 CREATININE 0.80 0.67 0.56*  CALCIUM 8.8 8.8* 8.2*   Estimated Creatinine Clearance: 90.6 mL/min (A) (by C-G formula based on SCr of 0.56 mg/dL (L)).   LIVER Recent Labs  Lab 08/20/17 0948 08/23/17 2021 08/23/17 2040 08/25/17 0656  AST 27 29  --   --   ALT 25 20  --   --   ALKPHOS 81 68  --   --   BILITOT 0.6 0.9  --   --   PROT 6.8 7.3  --   --   ALBUMIN 1.8* 2.3*  --   --   INR  --   --  2.40 2.10     INFECTIOUS Recent Labs  Lab 08/23/17 2037  08/23/17 2240  LATICACIDVEN 1.67 1.45     ENDOCRINE CBG (last 3)  Recent Labs    08/24/17 1731  GLUCAP 104*         IMAGING x48h  - image(s) personally visualized  -   highlighted in bold Ct Angio Chest Pe W/cm &/or Wo Cm  Result Date: 08/24/2017 CLINICAL DATA:  Left lower lobe lung cancer on chemotherapy. Dyspnea. EXAM: CT ANGIOGRAPHY CHEST WITH CONTRAST TECHNIQUE: Multidetector CT imaging of the chest was performed using the standard protocol during bolus administration of intravenous contrast. Multiplanar CT image reconstructions and MIPs were obtained to evaluate the vascular anatomy. CONTRAST:  173m ISOVUE-370 IOPAMIDOL (ISOVUE-370) INJECTION 76% COMPARISON:  Chest radiograph from one day prior. 06/25/2017 PET-CT and 05/17/2017 chest CT. FINDINGS: Cardiovascular: The study is low quality for the evaluation of pulmonary embolism, significantly limited by motion artifact, which largely precludes evaluation of the segmental and subsegmental pulmonary artery branches. There are no filling defects in the central, lobar or segmental pulmonary artery branches to suggest acute pulmonary embolism. Atherosclerotic nonaneurysmal thoracic aorta top-normal caliber main pulmonary artery (3.0 cm diameter), stable. Top-normal heart size. No significant pericardial fluid/thickening. Left main and 3 vessel coronary atherosclerosis. Mediastinum/Nodes: No discrete thyroid nodules. Unremarkable esophagus. No axillary adenopathy. Mildly enlarged 1.1 cm left upper paraesophageal mediastinal node (series 8/image 37), increased from 0.7 cm on 06/25/2017 PET-CT. Stable mildly enlarged 1.0 cm left subcarinal node (series 8/image 102). Mildly enlarged 1.0 cm right lower paraesophageal mediastinal node (series 8/image 154), increased from 0.8 cm on 06/25/2017. Mildly enlarged 1.0 cm left hilar node (series 8/image 90), decreased from 1.4 cm on 05/17/2017 chest CT. No right hilar adenopathy. Lungs/Pleura: No  pneumothorax. No right pleural effusion. Small to moderate dependent basilar left hydropneumothorax with circumferential left pleural thickening and enhancement, slightly increased in size with new pneumothorax component since 06/25/2017 PET-CT. Patchy consolidation throughout the left upper lobe and left lower lobe, largely new in the left upper  lobe and slightly worsened in the left lower lobe. Centrally necrotic left lower lobe lung mass measures approximately 5.9 x 4.7 cm and contains an air-fluid level (series 8/image 135), decreased from 7.2 x 6.6 cm on 06/25/2017 PET-CT using similar measurement technique. There is direct communication of the cavitary left lower lobe lung mass with a segmental left lower lobe bronchus and with the left pleural space, compatible with a new bronchopleural fistula. Upper abdomen: Small hiatal hernia. Musculoskeletal: No aggressive appearing focal osseous lesions. Moderate thoracic spondylosis. Review of the MIP images confirms the above findings. IMPRESSION: 1. No evidence of pulmonary embolism on this limited motion degraded scan. 2. Cavitary left lower lobe lung mass is decreased in size. 3. 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 4. New/increased postobstructive pneumonia throughout the left lung. 5. Left hilar adenopathy is decreased. 6. Mediastinal adenopathy is mildly increased, cannot exclude progressive metastatic mediastinal adenopathy. 7. Left main and 3 vessel coronary atherosclerosis. Aortic Atherosclerosis (ICD10-I70.0). Electronically Signed   By: Ilona Sorrel M.D.   On: 08/24/2017 01:29   Ct Lumbar Spine Wo Contrast  Result Date: 08/24/2017 CLINICAL DATA:  72 y/o M; history of lung cancer receiving chemotherapy. Back and leg pain, question metastasis. EXAM: CT LUMBAR SPINE WITHOUT CONTRAST TECHNIQUE: Multidetector CT imaging of the lumbar spine was  performed without intravenous contrast administration. Multiplanar CT image reconstructions were also generated. COMPARISON:  None. FINDINGS: Segmentation: 5 lumbar type vertebrae. Alignment: Normal. Vertebrae: No acute fracture or focal pathologic process. Paraspinal and other soft tissues: IVC filter in situ. IVC filter prongs extend beyond the lumen of the IVC. Moderate calcific atherosclerosis of the abdominal aorta and iliac arteries. Disc levels: Mild lumbar spondylosis with multilevel disc and facet degenerative changes greatest at the L3 through S1 levels where there is mild foraminal stenosis. No significant canal stenosis. IMPRESSION: 1. No bony sclerotic or destructive lesion to suggest metastatic disease. No acute fracture. 2. Mild lumbar spondylosis. 3. Aortic atherosclerosis. Electronically Signed   By: Kristine Garbe M.D.   On: 08/24/2017 01:05   Dg Chest Port 1 View  Result Date: 08/23/2017 CLINICAL DATA:  72 year old male with history of lung cancer. Patient is currently on chemotherapy and presents with fever. EXAM: PORTABLE CHEST 1 VIEW COMPARISON:  Chest radiograph dated 07/25/2017, CT dated 05/17/2017 and PET-CT dated 06/25/2017 FINDINGS: Left mid to lower lung field airspace densities appear relatively similar to the prior radiograph of 07/25/2017 and likely combination of mass, pleural effusion, and associated atelectasis versus infiltrate. Stable rounded lucency may represent a necrotic focus. The right lung is clear. There is no pneumothorax. Evaluation of the lung apices is limited due to superimposition of the patient's mandible. Stable cardiac silhouette. No acute osseous pathology. IMPRESSION: Left mid to lower lung field airspace opacity relatively similar to prior radiograph and may represent combination of mass, atelectasis, and effusion. Pneumonia is not excluded. Clinical correlation is recommended. Electronically Signed   By: Anner Crete M.D.   On: 08/23/2017  21:24   Results for orders placed or performed during the hospital encounter of 08/23/17  Blood Culture (routine x 2)     Status: None (Preliminary result)   Collection Time: 08/23/17  8:21 PM  Result Value Ref Range Status   Specimen Description BLOOD LEFT FOREARM  Final   Special Requests   Final    BOTTLES DRAWN AEROBIC AND ANAEROBIC Blood Culture adequate volume   Culture   Final  NO GROWTH < 12 HOURS Performed at [facility] [address] [place], [place] [address]    Report Status PENDING  Incomplete  Blood Culture (routine x 2)     Status: None (Preliminary result)   Collection Time: 08/23/17  8:21 PM  Result Value Ref Range Status   Specimen Description BLOOD RIGHT ANTECUBITAL  Final   Special Requests   Final    BOTTLES DRAWN AEROBIC AND ANAEROBIC Blood Culture adequate volume   Culture   Final    NO GROWTH < 12 HOURS Performed at [facility], [place] [address], [place], [place] [address]    Report Status PENDING  Incomplete  Urine culture     Status: Abnormal   Collection Time: 08/24/17  7:27 AM  Result Value Ref Range Status   Specimen Description URINE, CATHETERIZED  Final   Special Requests NONE  Final   Culture MULTIPLE SPECIES PRESENT, SUGGEST RECOLLECTION (A)  Final   Report Status 08/25/2017 FINAL  Final  Culture, expectorated sputum-assessment     Status: None   Collection Time: 08/24/17 12:15 PM  Result Value Ref Range Status   Specimen Description EXPECTORATED SPUTUM  Final   Special Requests NONE  Final   Sputum evaluation THIS SPECIMEN IS ACCEPTABLE FOR SPUTUM CULTURE  Final   Report Status 08/24/2017 FINAL  Final  Culture, respiratory (NON-Expectorated)     Status: None (Preliminary result)   Collection Time: 08/24/17 12:15 PM  Result Value Ref Range Status   Specimen Description EXPECTORATED SPUTUM  Final   Special Requests NONE Reflexed from [id number]  Final   Gram Stain   Final    FEW WBC PRESENT, PREDOMINANTLY PMN ABUNDANT GRAM  POSITIVE COCCI IN CLUSTERS MODERATE GRAM NEGATIVE RODS FEW GRAM POSITIVE RODS    Culture   Final    CULTURE REINCUBATED FOR BETTER GROWTH Performed at [facility], [place] [address] [place],  [address]    Report Status PENDING  Incomplete  MRSA PCR Screening     Status: Abnormal   Collection Time: 08/24/17  2:10 PM  Result Value Ref Range Status   MRSA by PCR POSITIVE (A) NEGATIVE Final    Comment:        The GeneXpert MRSA Assay (FDA approved for NASAL specimens only), is one component of a comprehensive MRSA colonization surveillance program. It is not intended to diagnose MRSA infection nor to guide or monitor treatment for MRSA infections. RESULT CALLED TO, READ BACK BY AND VERIFIED WITH: FRASER,B @ 1803 ON [id number] BY POTEAT,S     Anti-infectives (From admission, onward)   Start     Dose/Rate Route Frequency Ordered Stop   08/24/17 1800  ceFEPIme (MAXIPIME) 1 g in dextrose 5 % 50 mL IVPB     1 g 100 mL/hr over 30 Minutes Intravenous Every 8 hours 08/24/17 0938     08/24/17 1000  vancomycin (VANCOCIN) IVPB 1000 mg/200 mL premix     1,000 mg 200 mL/hr over 60 Minutes Intravenous Every 12 hours 08/24/17 0941     08/24/17 0630  aztreonam (AZACTAM) 2 g in dextrose 5 % 50 mL IVPB     2 g 100 mL/hr over 30 Minutes Intravenous NOW 08/24/17 0629 08/24/17 0913   08/24/17 0200  aztreonam (AZACTAM) 2 g in dextrose 5 % 50 mL IVPB  Status:  Discontinued     2 g 100 mL/hr over 30 Minutes Intravenous  Once 08/24/17 0146 08/24/17 0151   08/23/17 2030  levofloxacin (LEVAQUIN) IVPB 750 mg     750 mg 100 mL/hr over 90 Minutes Intravenous  Once 08/23/17 2017 08/23/17 2302   08/23/17 2030  aztreonam (AZACTAM) 2 g in dextrose 5 % 50 mL IVPB     2 g 100 mL/hr over 30 Minutes Intravenous  Once 08/23/17 2017 08/23/17 2108   08/23/17 2030  vancomycin (VANCOCIN) IVPB 1000 mg/200 mL premix     1,000 mg 200 mL/hr over 60 Minutes Intravenous  Once 08/23/17 2017 08/23/17 2152          ASSESSMENT and PLAN  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  His current ECOG is 3 in recent weeks and 4 with a hospitalization.  He has stage IV non-small cell lung cancer status post 2 cycles of chemotherapy  There is a poor prognostic sign   plan -He is not a candidate for resection given his advanced lung cancer, age and for ECOG -At this point in time there is no pleural fluid that would warrant a drain -Just recommend broad-spectrum antibiotics for his current pneumonia and expectant follow-up -If he develops fluid collection or any issues he might want to drain for palliative purposes - With a he is a candidate for further chemotherapy-or second line immunotherapy is to be decided by Dr. Rogue Jury upon discharge her even in the hospital Goals of care depending on Dr. Worthy Flank input-including consideration for hospice depending on Dr. Worthy Flank input  Pulmonary will round again in 2 days on August 27, 2017 Monday       FAMILY  - Updates: 08/25/2017 --> son, brother, girlfriend all updated at the bedside keep in the bedside  - Inter-disciplinary family meet or Palliative Care meeting due by:  DAy 7. Current LOS is LOS 1 days  CODE STATUS    Code Status Orders  (From admission, onward)        Start     Ordered   08/24/17 1726  Full code  Continuous     08/24/17 1725    Code Status History    Date Active Date Inactive Code Status Order ID Comments User Context   This patient has a current code status but no historical code status.        DISPO MedSurg    Dr. Brand Males, M.D., [facility].C.P Pulmonary and Critical Care Medicine Staff Physician, [place] Director - Interstitial Lung Disease  Program  Pulmonary [place] [facility] [place], [place],  [address]  Pager: [phone], If no answer or between  15:00h - 7:00h: call [phone] Telephone: [phone]

## 2017-08-25 NOTE — Progress Notes (Signed)
PROGRESS NOTE    William Stanley  WUX:[id number] DOB: [birth year] DOA: 08/23/2017 PCP: [name], PA-C   Brief Narrative: 72 y.o. male with h/o Lung Ca on ChemoRx, h/o DVT/PE, A. fib on chronic anticoagulation (Coumadin), who presented with complaints of back and leg pain.   Patient is a poor historian, history obtained from patient's sister at bedside, , no vomiting no diarrhea, no frank chest pains , patient had tachycardia, leukocytosis and a fever of 100.8 on admission, patient had increased oxygen requirement, increased work of breathing, patient met sepsis criteria on admission, lactic acid was not elevated.  Sepsis is presumed to be secondary to combination of pneumonia and possible UTI,Ultimately patient had CT scan of the abdomen which showed a postobstructive pneumonia with bronchopulmonary fistula.    In ED.... Patient received vancomycin,  Levaquin and azactam due to concerns about penicillin allergy     Assessment & Plan:   Principal Problem:   Sepsis ([place]) Active Problems:   Atrial fibrillation (HCC)   Warfarin anticoagulation   Diabetes mellitus type 2, uncomplicated (HCC)   Stage IV squamous cell carcinoma of left lung (HCC)   PNA (pneumonia)   UTI (urinary tract infection)  1] acute hypoxic respiratory failure secondary to healthcare associated pneumonia-CT scan of the chest shows new postobstructive pneumonia throughout the left lung.  With left hilar adenopathy.  And mild mediastinal adenopathy.  CT scan of the chest showsNo evidence of pulmonary embolism on this limited motion degraded scan. 2. Cavitary left lower lobe lung mass is decreased in size. 3. 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 4. New/increased postobstructive pneumonia throughout the left lung. 5. Left hilar adenopathy is decreased. 6. Mediastinal adenopathy is mildly increased,  cannot exclude progressive metastatic mediastinal adenopathy. 7. Left main and 3 vessel coronary atherosclerosis  -Patient started on vancomycin and aztreonam.  Pulmonary consult has been placed for possible bronc for bronchopleural fistula.  2] history of PE DVT on Coumadin  3] history of A. fib on Coumadin  4] type 2 diabetes takes metformin at home which is on hold.  5] hypertension he does not take any medications at home will place him on IV Lopressor as needed.  6 history of lung cancer status post recent chemotherapy 5 days ago..  She is followed by by dr [place] Bells.  He has an appointment to see him at the end of the month.  P CCM consult has been placed since admission and await the recommendations.  7] anemia patient has a history of anemia of chronic disease.  This a.m. his hemoglobin has dropped to 7.4 from 9.4 yesterday.  Stat CBC was drawn it came back at 7.1.  There is no signs of active bleeding anywhere at this time.  However I will arrange for a unit of blood transfusion.  Guaiac his stools.  He does have chronic anemia and his hemoglobin runs anywhere in the nines.  Question related to post chemotherapy.  8] hypokalemia replete and recheck levels tomorrow. DVT prophylaxis: Patient is on Coumadin Code Status: He is a full code Family Communication: Discussed with patient and caregiver in the room Disposition Plan: To be determined Consultants: P CCM pending  Procedures: None Antimicrobials: Vanco and aztreonam Subjective: Complains of cough and shortness of breath just got a breathing treatment so feels better.  Objective: Vitals:   08/24/17 2137 08/25/17 0253 08/25/17 0616 08/25/17 0736  BP: 125/62  (!) 117/59  Pulse: 78  80   Resp: 20  20   Temp: 98.7 F (37.1 C)  99.3 F (37.4 C)   TempSrc: Oral  Oral   SpO2: 100% 92% 95% 94%    Intake/Output Summary (Last 24 hours) at 08/25/2017 1213 Last data filed at 08/25/2017 0600 Gross per 24 hour  Intake 2342.5 ml    Output -  Net 2342.5 ml   There were no vitals filed for this visit.  Examination:  General exam: Appears calm and comfortable  Respiratory system: Clear to auscultation. Respiratory effort normal. Cardiovascular system: S1 & S2 heard, RRR. No JVD, murmurs, rubs, gallops or clicks. No pedal edema. Gastrointestinal system: Abdomen is nondistended, soft and nontender. No organomegaly or masses felt. Normal bowel sounds heard. Central nervous system: Alert and oriented. No focal neurological deficits. Extremities: Symmetric 5 x 5 power. Skin: No rashes, lesions or ulcers Psychiatry: Judgement and insight appear normal. Mood & affect appropriate.     Data Reviewed: I have personally reviewed following labs and imaging studies  CBC: Recent Labs  Lab 08/20/17 0948 08/23/17 2021 08/25/17 0656  WBC 10.0 13.6* 4.0  NEUTROABS 7.9* 13.0*  --   HGB 9.1* 9.4* 7.4*  HCT 28.2* 30.3* 24.0*  MCV 85.7 89.6 90.6  PLT 646* 496* 177   Basic Metabolic Panel: Recent Labs  Lab 08/20/17 0948 08/23/17 2021 08/25/17 0656  NA 137 134* 135  K 3.5 3.6 3.2*  CL 99 97* 100*  CO2 '28 27 28  '$ GLUCOSE 106 109* 108*  BUN '11 15 10  '$ CREATININE 0.80 0.67 0.56*  CALCIUM 8.8 8.8* 8.2*   GFR: Estimated Creatinine Clearance: 90.6 mL/min (A) (by C-G formula based on SCr of 0.56 mg/dL (L)). Liver Function Tests: Recent Labs  Lab 08/20/17 0948 08/23/17 2021  AST 27 29  ALT 25 20  ALKPHOS 81 68  BILITOT 0.6 0.9  PROT 6.8 7.3  ALBUMIN 1.8* 2.3*   No results for input(s): LIPASE, AMYLASE in the last 168 hours. No results for input(s): AMMONIA in the last 168 hours. Coagulation Profile: Recent Labs  Lab 08/23/17 2040 08/25/17 0656  INR 2.40 2.10   Cardiac Enzymes: No results for input(s): CKTOTAL, CKMB, CKMBINDEX, TROPONINI in the last 168 hours. BNP (last 3 results) No results for input(s): PROBNP in the last 8760 hours. HbA1C: No results for input(s): HGBA1C in the last 72  hours. CBG: Recent Labs  Lab 08/24/17 1731  GLUCAP 104*   Lipid Profile: No results for input(s): CHOL, HDL, LDLCALC, TRIG, CHOLHDL, LDLDIRECT in the last 72 hours. Thyroid Function Tests: No results for input(s): TSH, T4TOTAL, FREET4, T3FREE, THYROIDAB in the last 72 hours. Anemia Panel: No results for input(s): VITAMINB12, FOLATE, FERRITIN, TIBC, IRON, RETICCTPCT in the last 72 hours. Sepsis Labs: Recent Labs  Lab 08/23/17 2037 08/23/17 2240  LATICACIDVEN 1.67 1.45    Recent Results (from the past 240 hour(s))  Blood Culture (routine x 2)     Status: None (Preliminary result)   Collection Time: 08/23/17  8:21 PM  Result Value Ref Range Status   Specimen Description BLOOD LEFT FOREARM  Final   Special Requests   Final    BOTTLES DRAWN AEROBIC AND ANAEROBIC Blood Culture adequate volume   Culture   Final    NO GROWTH < 12 HOURS Performed at [facility], [place] [address] [place], [place] [address]    Report Status PENDING  Incomplete  Blood Culture (routine x 2)     Status:  None (Preliminary result)   Collection Time: 08/23/17  8:21 PM  Result Value Ref Range Status   Specimen Description BLOOD RIGHT ANTECUBITAL  Final   Special Requests   Final    BOTTLES DRAWN AEROBIC AND ANAEROBIC Blood Culture adequate volume   Culture   Final    NO GROWTH < 12 HOURS Performed at [facility], [address] [place], [place] [address]    Report Status PENDING  Incomplete  Urine culture     Status: Abnormal   Collection Time: 08/24/17  7:27 AM  Result Value Ref Range Status   Specimen Description URINE, CATHETERIZED  Final   Special Requests NONE  Final   Culture MULTIPLE SPECIES PRESENT, SUGGEST RECOLLECTION (A)  Final   Report Status 08/25/2017 FINAL  Final  Culture, expectorated sputum-assessment     Status: None   Collection Time: 08/24/17 12:15 PM  Result Value Ref Range Status   Specimen Description EXPECTORATED SPUTUM  Final   Special Requests NONE  Final    Sputum evaluation THIS SPECIMEN IS ACCEPTABLE FOR SPUTUM CULTURE  Final   Report Status 08/24/2017 FINAL  Final  Culture, respiratory (NON-Expectorated)     Status: None (Preliminary result)   Collection Time: 08/24/17 12:15 PM  Result Value Ref Range Status   Specimen Description EXPECTORATED SPUTUM  Final   Special Requests NONE Reflexed from [id number]  Final   Gram Stain   Final    FEW WBC PRESENT, PREDOMINANTLY PMN ABUNDANT GRAM POSITIVE COCCI IN CLUSTERS MODERATE GRAM NEGATIVE RODS FEW GRAM POSITIVE RODS    Culture   Final    CULTURE REINCUBATED FOR BETTER GROWTH Performed at [facility], [place] [address] [place], [place] [address]    Report Status PENDING  Incomplete  MRSA PCR Screening     Status: Abnormal   Collection Time: 08/24/17  2:10 PM  Result Value Ref Range Status   MRSA by PCR POSITIVE (A) NEGATIVE Final    Comment:        The GeneXpert MRSA Assay (FDA approved for NASAL specimens only), is one component of a comprehensive MRSA colonization surveillance program. It is not intended to diagnose MRSA infection nor to guide or monitor treatment for MRSA infections. RESULT CALLED TO, READ BACK BY AND VERIFIED WITH: FRASER,B @ 1803 ON [id number] BY POTEAT,S          Radiology Studies: Ct Angio Chest Pe W/cm &/or Wo Cm  Result Date: 08/24/2017 CLINICAL DATA:  Left lower lobe lung cancer on chemotherapy. Dyspnea. EXAM: CT ANGIOGRAPHY CHEST WITH CONTRAST TECHNIQUE: Multidetector CT imaging of the chest was performed using the standard protocol during bolus administration of intravenous contrast. Multiplanar CT image reconstructions and MIPs were obtained to evaluate the vascular anatomy. CONTRAST:  117m ISOVUE-370 IOPAMIDOL (ISOVUE-370) INJECTION 76% COMPARISON:  Chest radiograph from one day prior. 06/25/2017 PET-CT and 05/17/2017 chest CT. FINDINGS: Cardiovascular: The study is low quality for the evaluation of pulmonary embolism, significantly limited by  motion artifact, which largely precludes evaluation of the segmental and subsegmental pulmonary artery branches. There are no filling defects in the central, lobar or segmental pulmonary artery branches to suggest acute pulmonary embolism. Atherosclerotic nonaneurysmal thoracic aorta top-normal caliber main pulmonary artery (3.0 cm diameter), stable. Top-normal heart size. No significant pericardial fluid/thickening. Left main and 3 vessel coronary atherosclerosis. Mediastinum/Nodes: No discrete thyroid nodules. Unremarkable esophagus. No axillary adenopathy. Mildly enlarged 1.1 cm left upper paraesophageal mediastinal node (series 8/image 37), increased from 0.7 cm on  06/25/2017 PET-CT. Stable mildly enlarged 1.0 cm left subcarinal node (series 8/image 102). Mildly enlarged 1.0 cm right lower paraesophageal mediastinal node (series 8/image 154), increased from 0.8 cm on 06/25/2017. Mildly enlarged 1.0 cm left hilar node (series 8/image 90), decreased from 1.4 cm on 05/17/2017 chest CT. No right hilar adenopathy. Lungs/Pleura: No pneumothorax. No right pleural effusion. Small to moderate dependent basilar left hydropneumothorax with circumferential left pleural thickening and enhancement, slightly increased in size with new pneumothorax component since 06/25/2017 PET-CT. Patchy consolidation throughout the left upper lobe and left lower lobe, largely new in the left upper lobe and slightly worsened in the left lower lobe. Centrally necrotic left lower lobe lung mass measures approximately 5.9 x 4.7 cm and contains an air-fluid level (series 8/image 135), decreased from 7.2 x 6.6 cm on 06/25/2017 PET-CT using similar measurement technique. There is direct communication of the cavitary left lower lobe lung mass with a segmental left lower lobe bronchus and with the left pleural space, compatible with a new bronchopleural fistula. Upper abdomen: Small hiatal hernia. Musculoskeletal: No aggressive appearing focal  osseous lesions. Moderate thoracic spondylosis. Review of the MIP images confirms the above findings. IMPRESSION: 1. No evidence of pulmonary embolism on this limited motion degraded scan. 2. Cavitary left lower lobe lung mass is decreased in size. 3. 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 4. New/increased postobstructive pneumonia throughout the left lung. 5. Left hilar adenopathy is decreased. 6. Mediastinal adenopathy is mildly increased, cannot exclude progressive metastatic mediastinal adenopathy. 7. Left main and 3 vessel coronary atherosclerosis. Aortic Atherosclerosis (ICD10-I70.0). Electronically Signed   By: Ilona Sorrel M.D.   On: 08/24/2017 01:29   Ct Lumbar Spine Wo Contrast  Result Date: 08/24/2017 CLINICAL DATA:  72 y/o M; history of lung cancer receiving chemotherapy. Back and leg pain, question metastasis. EXAM: CT LUMBAR SPINE WITHOUT CONTRAST TECHNIQUE: Multidetector CT imaging of the lumbar spine was performed without intravenous contrast administration. Multiplanar CT image reconstructions were also generated. COMPARISON:  None. FINDINGS: Segmentation: 5 lumbar type vertebrae. Alignment: Normal. Vertebrae: No acute fracture or focal pathologic process. Paraspinal and other soft tissues: IVC filter in situ. IVC filter prongs extend beyond the lumen of the IVC. Moderate calcific atherosclerosis of the abdominal aorta and iliac arteries. Disc levels: Mild lumbar spondylosis with multilevel disc and facet degenerative changes greatest at the L3 through S1 levels where there is mild foraminal stenosis. No significant canal stenosis. IMPRESSION: 1. No bony sclerotic or destructive lesion to suggest metastatic disease. No acute fracture. 2. Mild lumbar spondylosis. 3. Aortic atherosclerosis. Electronically Signed   By: Kristine Garbe M.D.   On: 08/24/2017 01:05   Dg Chest Port 1  View  Result Date: 08/23/2017 CLINICAL DATA:  72 year old male with history of lung cancer. Patient is currently on chemotherapy and presents with fever. EXAM: PORTABLE CHEST 1 VIEW COMPARISON:  Chest radiograph dated 07/25/2017, CT dated 05/17/2017 and PET-CT dated 06/25/2017 FINDINGS: Left mid to lower lung field airspace densities appear relatively similar to the prior radiograph of 07/25/2017 and likely combination of mass, pleural effusion, and associated atelectasis versus infiltrate. Stable rounded lucency may represent a necrotic focus. The right lung is clear. There is no pneumothorax. Evaluation of the lung apices is limited due to superimposition of the patient's mandible. Stable cardiac silhouette. No acute osseous pathology. IMPRESSION: Left mid to lower lung field airspace opacity relatively similar to prior radiograph and may represent combination of  mass, atelectasis, and effusion. Pneumonia is not excluded. Clinical correlation is recommended. Electronically Signed   By: Anner Crete M.D.   On: 08/23/2017 21:24        Scheduled Meds: . guaiFENesin  600 mg Oral BID  . ipratropium-albuterol  3 mL Nebulization Q6H  . loratadine  10 mg Oral Daily  . pravastatin  20 mg Oral Daily  . senna  1 tablet Oral BID  . sodium chloride flush  3 mL Intravenous Q12H  . tamsulosin  0.4 mg Oral Daily  . warfarin  2.5 mg Oral q1800  . Warfarin - Pharmacist Dosing Inpatient   Does not apply q1800   Continuous Infusions: . sodium chloride    . ceFEPime (MAXIPIME) IV 1 g (08/25/17 1041)  . vancomycin 1,000 mg (08/25/17 1041)     LOS: 1 day      Georgette Shell, MD [facility]  If 7PM-7AM, please contact night-coverage [email or web address] Password [facility] 08/25/2017, 12:13 PM

## 2017-08-25 NOTE — Assessment & Plan Note (Signed)
He has acquired a new bronchopleural fistula because of cavitation of his left lower lobe lung cancer and penetration of this into the pleural space.  His current ECOG is 3 in recent weeks and 4 with a hospitalization.  He has stage IV non-small cell lung cancer status post 2 cycles of chemotherapy  There is a poor prognostic sign   plan -He is not a candidate for resection given his advanced lung cancer, age and for ECOG -At this point in time there is no pleural fluid that would warrant a drain -Just recommend broad-spectrum antibiotics for his current pneumonia and expectant follow-up -If he develops fluid collection or any issues he might want to drain for palliative purposes - With a he is a candidate for further chemotherapy-or second line immunotherapy is to be decided by Dr. Rogue Jury upon discharge her even in the hospital Goals of care depending on Dr. Worthy Flank input-including consideration for hospice depending on Dr. Worthy Flank input  Pulmonary will round again in 2 days on August 27, 2017 Monday

## 2017-08-26 ENCOUNTER — Inpatient Hospital Stay (HOSPITAL_COMMUNITY): Payer: Medicare HMO

## 2017-08-26 DIAGNOSIS — I4891 Unspecified atrial fibrillation: Secondary | ICD-10-CM

## 2017-08-26 DIAGNOSIS — R609 Edema, unspecified: Secondary | ICD-10-CM

## 2017-08-26 DIAGNOSIS — M549 Dorsalgia, unspecified: Secondary | ICD-10-CM

## 2017-08-26 LAB — BPAM RBC
Blood Product Expiration Date: 201902182359
ISSUE DATE / TIME: 201901192212
UNIT TYPE AND RH: 5100

## 2017-08-26 LAB — BASIC METABOLIC PANEL
ANION GAP: 5 (ref 5–15)
BUN: 6 mg/dL (ref 6–20)
CHLORIDE: 99 mmol/L — AB (ref 101–111)
CO2: 30 mmol/L (ref 22–32)
Calcium: 8.3 mg/dL — ABNORMAL LOW (ref 8.9–10.3)
Creatinine, Ser: 0.54 mg/dL — ABNORMAL LOW (ref 0.61–1.24)
GFR calc Af Amer: >60 mL/min (ref >60)
GLUCOSE: 106 mg/dL — AB (ref 65–99)
POTASSIUM: 3.5 mmol/L (ref 3.5–5.1)
Sodium: 134 mmol/L — ABNORMAL LOW (ref 135–145)

## 2017-08-26 LAB — CBC WITH DIFFERENTIAL/PLATELET
BASOS PCT: 0 %
Basophils Absolute: 0 10*3/uL (ref 0.0–0.1)
EOS ABS: 0.1 10*3/uL (ref 0.0–0.7)
EOS PCT: 2 %
HCT: 24.4 % — ABNORMAL LOW (ref 39.0–52.0)
HEMOGLOBIN: 7.6 g/dL — AB (ref 13.0–17.0)
LYMPHS PCT: 11 %
Lymphs Abs: 0.4 10*3/uL — ABNORMAL LOW (ref 0.7–4.0)
MCH: 27.8 pg (ref 26.0–34.0)
MCHC: 31.1 g/dL (ref 30.0–36.0)
MCV: 89.4 fL (ref 78.0–100.0)
MONO ABS: 0.1 10*3/uL (ref 0.1–1.0)
Monocytes Relative: 2 %
NEUTROS PCT: 85 %
Neutro Abs: 2.7 10*3/uL (ref 1.7–7.7)
PLATELETS: 279 10*3/uL (ref 150–400)
RBC: 2.73 MIL/uL — AB (ref 4.22–5.81)
RDW: 18.7 % — ABNORMAL HIGH (ref 11.5–15.5)
WBC: 3.3 10*3/uL — AB (ref 4.0–10.5)

## 2017-08-26 LAB — PROTIME-INR
INR: 2.31
Prothrombin Time: 25.2 seconds — ABNORMAL HIGH (ref 11.4–15.2)

## 2017-08-26 LAB — TYPE AND SCREEN
ABO/RH(D): O POS
Antibody Screen: NEGATIVE
Unit division: 0

## 2017-08-26 LAB — MAGNESIUM: MAGNESIUM: 1.6 mg/dL — AB (ref 1.7–2.4)

## 2017-08-26 LAB — PHOSPHORUS: Phosphorus: 3.1 mg/dL (ref 2.5–4.6)

## 2017-08-26 MED ORDER — MAGNESIUM SULFATE 4 GM/100ML IV SOLN
4.0000 g | Freq: Once | INTRAVENOUS | Status: AC
  Administered 2017-08-26: 4 g via INTRAVENOUS
  Filled 2017-08-26: qty 100

## 2017-08-26 MED ORDER — GLUCERNA SHAKE PO LIQD
237.0000 mL | Freq: Three times a day (TID) | ORAL | Status: DC
  Administered 2017-08-28 – 2017-08-30 (×3): 237 mL via ORAL
  Filled 2017-08-26 (×16): qty 237

## 2017-08-26 MED ORDER — BISACODYL 5 MG PO TBEC
10.0000 mg | DELAYED_RELEASE_TABLET | Freq: Once | ORAL | Status: AC
  Administered 2017-08-26: 10 mg via ORAL
  Filled 2017-08-26: qty 2

## 2017-08-26 MED ORDER — IPRATROPIUM-ALBUTEROL 0.5-2.5 (3) MG/3ML IN SOLN
3.0000 mL | Freq: Four times a day (QID) | RESPIRATORY_TRACT | Status: DC
  Administered 2017-08-27 – 2017-08-29 (×8): 3 mL via RESPIRATORY_TRACT
  Filled 2017-08-26 (×9): qty 3

## 2017-08-26 MED ORDER — HYDROMORPHONE HCL 1 MG/ML IJ SOLN
0.5000 mg | Freq: Once | INTRAMUSCULAR | Status: AC
  Administered 2017-08-26: 0.5 mg via INTRAVENOUS
  Filled 2017-08-26: qty 0.5

## 2017-08-26 NOTE — Progress Notes (Signed)
PROGRESS NOTE    William Stanley  HWE:[id number] DOB: 1945-09-19 DOA: 08/23/2017 PCP: [name], PA-C  Brief Narrative:  72 y.o.malewith h/o Lung Ca on ChemoRx, h/oDVT/PE, A. fib on chronic anticoagulation(Coumadin),who presented with complaints of back and leg pain.Patient is a poor historian, history obtained from patient's sister at bedside, ,no vomiting no diarrhea, no frank chest pains,patient had tachycardia, leukocytosis and a fever of 100.8 on admission, patient had increased oxygen requirement, increased work of breathing, patient met sepsis criteria on admission,lactic acid was not elevated.Sepsis is presumed to be secondary to combination of pneumonia and possible UTI,Ultimately patient had CT scan of the abdomen which showed a postobstructive pneumonia with bronchopulmonary fistula.  In ED.Marland KitchenMarland KitchenMarland KitchenPatient received vancomycin,Levaquin and azactamdue to concerns about penicillin allergy     Assessment & Plan:   Principal Problem:   Sepsis ([place]) Active Problems:   Atrial fibrillation ([place])   Warfarin anticoagulation   Diabetes mellitus type 2, uncomplicated (HCC)   Stage IV squamous cell carcinoma of left lung (HCC)   PNA (pneumonia)   UTI (urinary tract infection)   Bronchopleural fistula (HCC)   1] acute hypoxic respiratory failure secondary to healthcare associated pneumonia-CT scan of the chest shows new postobstructive pneumonia throughout the left lung.  With left hilar adenopathy.  And mild mediastinal adenopathy.  CT scan of the chest showsNo evidence of pulmonary embolism on this limited motion degraded scan. 2. Cavitary left lower lobe lung mass is decreased in size. 3. 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 4. New/increased postobstructive pneumonia throughout the left lung. 5. Left hilar adenopathy is decreased. 6. Mediastinal  adenopathy is mildly increased, cannot exclude progressive metastatic mediastinal adenopathy. 7. Left main and 3 vessel coronary atherosclerosis  -Patient started on vancomycin and aztreonam.  Pulmonary consult has been placed for possible bronc for bronchopleural fistula.  2] history of PE DVT on Coumadin  3] history of A. fib on Coumadin  4] type 2 diabetes takes metformin at home which is on hold.  5] hypertension he does not take any medications at home will place him on IV Lopressor as needed.  6 history of lung cancer status post recent chemotherapy 5 days ago..  She is followed by by dr [place] Bells.  He has an appointment to see him at the end of the month.  P CCM consult has been placed since admission and await the recommendations.  7] anemia patient has a history of anemia of chronic disease.  This a.m. his hemoglobin went up only to  7.6 status post 1 unit blood transfusion from 7.1.  Stool guaiacs has not been done yet.  He has not had a bowel movement yesterday or today.  8] hypomagnesemia replete   DVT prophylaxis: Coumadin Code Status discussed CODE STATUS with the patient while the caregiver was in the room with him and he wanted to be a DO NOT RESUSCITATE. Family Communication no family available Disposition Plan: TBD Consultants:  P CCM Procedures: None Antimicrobials: Vancomycin aztreonam Subjective: Feels about the same though the cough is better.  Objective: Vitals:   08/26/17 0040 08/26/17 0122 08/26/17 0603 08/26/17 0743  BP: 126/69  128/70   Pulse: 88  98   Resp: 18  18   Temp: 97.8 F (36.6 C)  97.9 F (36.6 C)   TempSrc: Axillary  Oral   SpO2: 97% 98% 96% 96%    Intake/Output Summary (Last 24 hours) at 08/26/2017 0940  Last data filed at 08/26/2017 0130 Gross per 24 hour  Intake 1077.5 ml  Output -  Net 1077.5 ml   There were no vitals filed for this visit.  Examination:  General exam: Appears calm and comfortable  Respiratory system:  Crackles and decreased breath breath sounds auscultation. Respiratory effort normal. Cardiovascular system: S1 & S2 heard, RRR. No JVD, murmurs, rubs, gallops or clicks. No pedal edema. Gastrointestinal system: Abdomen is nondistended, soft and nontender. No organomegaly or masses felt. Normal bowel sounds heard. Central nervous system: Alert and oriented. No focal neurological deficits. Extremities: Symmetric 5 x 5 power. Skin: No rashes, lesions or ulcers Psychiatry: Judgement and insight appear normal. Mood & affect appropriate.     Data Reviewed: I have personally reviewed following labs and imaging studies  CBC: Recent Labs  Lab 08/20/17 0948 08/23/17 2021 08/25/17 0656 08/25/17 1217 08/26/17 0625  WBC 10.0 13.6* 4.0 3.9* 3.3*  NEUTROABS 7.9* 13.0*  --   --  PENDING  HGB 9.1* 9.4* 7.4* 7.1* 7.6*  HCT 28.2* 30.3* 24.0* 23.4* 24.4*  MCV 85.7 89.6 90.6 91.1 89.4  PLT 646* 496* 362 334 562   Basic Metabolic Panel: Recent Labs  Lab 08/20/17 0948 08/23/17 2021 08/25/17 0656 08/26/17 0625  NA 137 134* 135 134*  K 3.5 3.6 3.2* 3.5  CL 99 97* 100* 99*  CO2 '28 27 28 30  '$ GLUCOSE 106 109* 108* 106*  BUN '11 15 10 6  '$ CREATININE 0.80 0.67 0.56* 0.54*  CALCIUM 8.8 8.8* 8.2* 8.3*  MG  --   --   --  1.6*  PHOS  --   --   --  3.1   GFR: Estimated Creatinine Clearance: 90.6 mL/min (A) (by C-G formula based on SCr of 0.54 mg/dL (L)). Liver Function Tests: Recent Labs  Lab 08/20/17 0948 08/23/17 2021  AST 27 29  ALT 25 20  ALKPHOS 81 68  BILITOT 0.6 0.9  PROT 6.8 7.3  ALBUMIN 1.8* 2.3*   No results for input(s): LIPASE, AMYLASE in the last 168 hours. No results for input(s): AMMONIA in the last 168 hours. Coagulation Profile: Recent Labs  Lab 08/23/17 2040 08/25/17 0656 08/26/17 0625  INR 2.40 2.10 2.31   Cardiac Enzymes: No results for input(s): CKTOTAL, CKMB, CKMBINDEX, TROPONINI in the last 168 hours. BNP (last 3 results) No results for input(s): PROBNP in  the last 8760 hours. HbA1C: No results for input(s): HGBA1C in the last 72 hours. CBG: Recent Labs  Lab 08/24/17 1731  GLUCAP 104*   Lipid Profile: No results for input(s): CHOL, HDL, LDLCALC, TRIG, CHOLHDL, LDLDIRECT in the last 72 hours. Thyroid Function Tests: No results for input(s): TSH, T4TOTAL, FREET4, T3FREE, THYROIDAB in the last 72 hours. Anemia Panel: No results for input(s): VITAMINB12, FOLATE, FERRITIN, TIBC, IRON, RETICCTPCT in the last 72 hours. Sepsis Labs: Recent Labs  Lab 08/23/17 2037 08/23/17 2240  LATICACIDVEN 1.67 1.45    Recent Results (from the past 240 hour(s))  Blood Culture (routine x 2)     Status: None (Preliminary result)   Collection Time: 08/23/17  8:21 PM  Result Value Ref Range Status   Specimen Description BLOOD LEFT FOREARM  Final   Special Requests   Final    BOTTLES DRAWN AEROBIC AND ANAEROBIC Blood Culture adequate volume   Culture   Final    NO GROWTH 2 DAYS Performed at [facility], [address] [place], [place] [address]    Report Status PENDING  Incomplete  Blood Culture (routine x 2)     Status: None (Preliminary result)   Collection Time: 08/23/17  8:21 PM  Result Value Ref Range Status   Specimen Description BLOOD RIGHT ANTECUBITAL  Final   Special Requests   Final    BOTTLES DRAWN AEROBIC AND ANAEROBIC Blood Culture adequate volume   Culture   Final    NO GROWTH 2 DAYS Performed at [facility], [address], [place], [place] [address]    Report Status PENDING  Incomplete  Urine culture     Status: Abnormal   Collection Time: 08/24/17  7:27 AM  Result Value Ref Range Status   Specimen Description URINE, CATHETERIZED  Final   Special Requests NONE  Final   Culture MULTIPLE SPECIES PRESENT, SUGGEST RECOLLECTION (A)  Final   Report Status 08/25/2017 FINAL  Final  Culture, expectorated sputum-assessment     Status: None   Collection Time: 08/24/17 12:15 PM  Result Value Ref Range Status   Specimen  Description EXPECTORATED SPUTUM  Final   Special Requests NONE  Final   Sputum evaluation THIS SPECIMEN IS ACCEPTABLE FOR SPUTUM CULTURE  Final   Report Status 08/24/2017 FINAL  Final  Culture, respiratory (NON-Expectorated)     Status: None (Preliminary result)   Collection Time: 08/24/17 12:15 PM  Result Value Ref Range Status   Specimen Description EXPECTORATED SPUTUM  Final   Special Requests NONE Reflexed from [id number]  Final   Gram Stain   Final    FEW WBC PRESENT, PREDOMINANTLY PMN ABUNDANT GRAM POSITIVE COCCI IN CLUSTERS MODERATE GRAM NEGATIVE RODS FEW GRAM POSITIVE RODS    Culture   Final    CULTURE REINCUBATED FOR BETTER GROWTH Performed at [facility], [place] [address] [place], [place] [address]    Report Status PENDING  Incomplete  MRSA PCR Screening     Status: Abnormal   Collection Time: 08/24/17  2:10 PM  Result Value Ref Range Status   MRSA by PCR POSITIVE (A) NEGATIVE Final    Comment:        The GeneXpert MRSA Assay (FDA approved for NASAL specimens only), is one component of a comprehensive MRSA colonization surveillance program. It is not intended to diagnose MRSA infection nor to guide or monitor treatment for MRSA infections. RESULT CALLED TO, READ BACK BY AND VERIFIED WITH: FRASER,B @ 1803 ON [id number] BY POTEAT,S          Radiology Studies: No results found.      Scheduled Meds: . bisacodyl  10 mg Oral Once  . guaiFENesin  600 mg Oral BID  . ipratropium-albuterol  3 mL Nebulization Q6H  . loratadine  10 mg Oral Daily  . pravastatin  20 mg Oral Daily  . senna  1 tablet Oral BID  . sodium chloride flush  3 mL Intravenous Q12H  . tamsulosin  0.4 mg Oral Daily  . warfarin  2.5 mg Oral q1800  . Warfarin - Pharmacist Dosing Inpatient   Does not apply q1800   Continuous Infusions: . sodium chloride    . ceFEPime (MAXIPIME) IV Stopped (08/26/17 0130)  . vancomycin Stopped (08/25/17 2225)     LOS: 2 days     Georgette Shell,  MD [facility]  If 7PM-7AM, please contact night-coverage [email or web address] Password [email or web address] 08/26/2017, 9:40 AM

## 2017-08-26 NOTE — Progress Notes (Signed)
Initial Nutrition Assessment  INTERVENTION:   Provide Glucerna Shake po TID, each supplement provides 220 kcal and 10 grams of protein  NUTRITION DIAGNOSIS:   Increased nutrient needs related to cancer and cancer related treatments as evidenced by estimated needs.  GOAL:   Patient will meet greater than or equal to 90% of their needs  MONITOR:   PO intake, Supplement acceptance, Weight trends, Labs, I & O's  REASON FOR ASSESSMENT:   Malnutrition Screening Tool    ASSESSMENT:   72 y.o. male with h/o Lung Ca on ChemoRx, h/o DVT/PE, A. fib on chronic anticoagulation (Coumadin),  who presented with complaints of back and leg pain.    Pt followed by [facility], last seen 1/14. Pt continues with the same nutrition impact symptoms: taste changes, early satiety, poor appetite. Today, pt managed to eat 100% of his breakfast of oatmeal, toast and blueberry muffin. Will order pt some Glucerna shakes for additional kcal and protein.  Per chart review, pt has lost 59 lb since 05/16/17 (23% wt loss x 3 months, significant for time frame).   Medications: Senokot tablet BID Labs reviewed; Low Na, Mg Phos WNL   NUTRITION - FOCUSED PHYSICAL EXAM:  Nutrition focused physical exam shows no sign of depletion of muscle mass or body fat.  Diet Order:  Diet Heart Room service appropriate? Yes; Fluid consistency: Thin  EDUCATION NEEDS:   No education needs have been identified at this time  Skin:  Skin Assessment: Reviewed RN Assessment  Last BM:  1/17  Height:   Ht Readings from Last 1 Encounters:  08/20/17 5\' 8"  (1.727 m)    Weight:   Wt Readings from Last 1 Encounters:  08/20/17 190 lb 3.2 oz (86.3 kg)    Ideal Body Weight:  70 kg  BMI:  28.9 kg/m^2  Estimated Nutritional Needs:   Kcal:  2300-2500  Protein:  110-120g  Fluid:  2.3L/day  Clayton Bibles, MS, RD, LDN [facility] Pager: [phone] After Hours Pager: [phone]

## 2017-08-26 NOTE — Progress Notes (Signed)
ANTICOAGULATION CONSULT NOTE - Follow Up  Pharmacy Consult for Warfarin Indication: Hx DVT/PE, afib  Allergies  Allergen Reactions  . Fish Allergy     Sweaty, upset stomach  . Penicillins     Blackouts  Has patient had a PCN reaction causing immediate rash, facial/tongue/throat swelling, SOB or lightheadedness with hypotension: Yes Has patient had a PCN reaction causing severe rash involving mucus membranes or skin necrosis: No Has patient had a PCN reaction that required hospitalization: No Has patient had a PCN reaction occurring within the last 10 years: No If all of the above answers are "NO", then may proceed with Cephalosporin use.     Patient Measurements:   Heparin Dosing Weight:   Vital Signs: Temp: 97.9 F (36.6 C) (01/20 0603) Temp Source: Oral (01/20 0603) BP: 128/70 (01/20 0603) Pulse Rate: 98 (01/20 0603)  Labs: Recent Labs    08/23/17 2021 08/23/17 2040 08/25/17 0656 08/25/17 1217 08/26/17 0625  HGB 9.4*  --  7.4* 7.1* 7.6*  HCT 30.3*  --  24.0* 23.4* 24.4*  PLT 496*  --  362 334 279  LABPROT  --  25.9* 23.4*  --  25.2*  INR  --  2.40 2.10  --  2.31  CREATININE 0.67  --  0.56*  --  0.54*    Estimated Creatinine Clearance: 90.6 mL/min (A) (by C-G formula based on SCr of 0.54 mg/dL (L)).   Medical History: Past Medical History:  Diagnosis Date  . Atrial fibrillation ([place])   . Diabetes ([place])   . DVT (deep venous thrombosis) ([place])   . Edema of lower extremity   . Hypertension   . Irritable bowel syndrome (IBS)   . Lung collapse    LEFT LUNG, TWICE  . Pulmonary embolism (HCC)     Medications:  Scheduled:  . guaiFENesin  600 mg Oral BID  . ipratropium-albuterol  3 mL Nebulization Q6H  . loratadine  10 mg Oral Daily  . pravastatin  20 mg Oral Daily  . senna  1 tablet Oral BID  . sodium chloride flush  3 mL Intravenous Q12H  . tamsulosin  0.4 mg Oral Daily  . warfarin  2.5 mg Oral q1800  . Warfarin - Pharmacist Dosing Inpatient   Does  not apply q1800   Infusions:  . sodium chloride    . ceFEPime (MAXIPIME) IV Stopped (08/26/17 0130)  . vancomycin Stopped (08/25/17 2225)   PRN:   Assessment: 72 yo male on chronic warfarin for hx afib and DVT/PE.  Home dose reported as 2.5mg  PO daily.  INR therapeutic (2.4) on admission.  Pharmacy consulted to continue dosing on admission 1/18  Today, 08/26/17  INR continues to be therapeutic on home regimen  CBC: Hgb 7.1 down, Plt 334 stable - continue to monitor  No reported bleeding  Goal of Therapy:  INR 2-3 Monitor platelets by anticoagulation protocol: Yes   Plan:  1) Continue warfarin home regimen as above - 2.5mg  today at 1800 2) Daily PT/INR   Adrian Saran, PharmD, BCPS Pager [phone] 08/26/2017 9:29 AM

## 2017-08-26 NOTE — Progress Notes (Signed)
Alerted MD to this morning's H&H.

## 2017-08-27 ENCOUNTER — Encounter: Payer: Medicare HMO | Admitting: Nutrition

## 2017-08-27 ENCOUNTER — Ambulatory Visit: Payer: Medicare HMO

## 2017-08-27 ENCOUNTER — Ambulatory Visit: Payer: Medicare HMO | Admitting: Oncology

## 2017-08-27 ENCOUNTER — Other Ambulatory Visit: Payer: Medicare HMO

## 2017-08-27 DIAGNOSIS — J189 Pneumonia, unspecified organism: Secondary | ICD-10-CM

## 2017-08-27 LAB — CBC WITH DIFFERENTIAL/PLATELET
Basophils Absolute: 0 10*3/uL (ref 0.0–0.1)
Basophils Relative: 0 %
Eosinophils Absolute: 0.2 10*3/uL (ref 0.0–0.7)
Eosinophils Relative: 6 %
HEMATOCRIT: 27.8 % — AB (ref 39.0–52.0)
HEMOGLOBIN: 8.6 g/dL — AB (ref 13.0–17.0)
LYMPHS ABS: 0.4 10*3/uL — AB (ref 0.7–4.0)
LYMPHS PCT: 14 %
MCH: 27.9 pg (ref 26.0–34.0)
MCHC: 30.9 g/dL (ref 30.0–36.0)
MCV: 90.3 fL (ref 78.0–100.0)
MONO ABS: 0.2 10*3/uL (ref 0.1–1.0)
MONOS PCT: 7 %
NEUTROS ABS: 2 10*3/uL (ref 1.7–7.7)
NEUTROS PCT: 73 %
Platelets: 242 10*3/uL (ref 150–400)
RBC: 3.08 MIL/uL — ABNORMAL LOW (ref 4.22–5.81)
RDW: 18.3 % — AB (ref 11.5–15.5)
WBC: 2.7 10*3/uL — ABNORMAL LOW (ref 4.0–10.5)

## 2017-08-27 LAB — COMPREHENSIVE METABOLIC PANEL
ALK PHOS: 123 U/L (ref 38–126)
ALT: 136 U/L — ABNORMAL HIGH (ref 17–63)
ANION GAP: 6 (ref 5–15)
AST: 301 U/L — ABNORMAL HIGH (ref 15–41)
Albumin: 1.8 g/dL — ABNORMAL LOW (ref 3.5–5.0)
BILIRUBIN TOTAL: 0.9 mg/dL (ref 0.3–1.2)
BUN: 7 mg/dL (ref 6–20)
CALCIUM: 8.6 mg/dL — AB (ref 8.9–10.3)
CO2: 32 mmol/L (ref 22–32)
Chloride: 98 mmol/L — ABNORMAL LOW (ref 101–111)
Creatinine, Ser: 0.57 mg/dL — ABNORMAL LOW (ref 0.61–1.24)
GFR calc Af Amer: >60 mL/min (ref >60)
GLUCOSE: 106 mg/dL — AB (ref 65–99)
POTASSIUM: 3.5 mmol/L (ref 3.5–5.1)
Sodium: 136 mmol/L (ref 135–145)
TOTAL PROTEIN: 6.2 g/dL — AB (ref 6.5–8.1)

## 2017-08-27 LAB — CULTURE, RESPIRATORY

## 2017-08-27 LAB — CULTURE, RESPIRATORY W GRAM STAIN: Culture: NORMAL

## 2017-08-27 LAB — PHOSPHORUS: Phosphorus: 3 mg/dL (ref 2.5–4.6)

## 2017-08-27 LAB — PROTIME-INR
INR: 2.61
Prothrombin Time: 27.7 seconds — ABNORMAL HIGH (ref 11.4–15.2)

## 2017-08-27 LAB — MAGNESIUM: Magnesium: 2 mg/dL (ref 1.7–2.4)

## 2017-08-27 MED ORDER — MUPIROCIN 2 % EX OINT
1.0000 "application " | TOPICAL_OINTMENT | Freq: Two times a day (BID) | CUTANEOUS | Status: DC
  Administered 2017-08-28 – 2017-08-30 (×3): 1 via NASAL

## 2017-08-27 MED ORDER — TRAMADOL HCL 50 MG PO TABS
25.0000 mg | ORAL_TABLET | Freq: Once | ORAL | Status: AC
  Administered 2017-08-27: 25 mg via ORAL
  Filled 2017-08-27: qty 1

## 2017-08-27 MED ORDER — WARFARIN SODIUM 2 MG PO TABS
2.0000 mg | ORAL_TABLET | Freq: Once | ORAL | Status: AC
  Administered 2017-08-27: 2 mg via ORAL
  Filled 2017-08-27: qty 1

## 2017-08-27 MED ORDER — CHLORHEXIDINE GLUCONATE CLOTH 2 % EX PADS
6.0000 | MEDICATED_PAD | Freq: Every day | CUTANEOUS | Status: DC
  Administered 2017-08-27 – 2017-08-30 (×2): 6 via TOPICAL

## 2017-08-27 NOTE — Progress Notes (Signed)
[facility] for Warfarin Indication: Hx DVT/PE, afib  Allergies  Allergen Reactions  . Fish Allergy     Sweaty, upset stomach  . Penicillins     Blackouts  Has patient had a PCN reaction causing immediate rash, facial/tongue/throat swelling, SOB or lightheadedness with hypotension: Yes Has patient had a PCN reaction causing severe rash involving mucus membranes or skin necrosis: No Has patient had a PCN reaction that required hospitalization: No Has patient had a PCN reaction occurring within the last 10 years: No If all of the above answers are "NO", then may proceed with Cephalosporin use.     Patient Measurements:   Vital Signs: Temp: 97.9 F (36.6 C) (01/21 0557) Temp Source: Oral (01/21 0557) BP: 127/75 (01/21 0557) Pulse Rate: 97 (01/21 0557)  Labs: Recent Labs    08/25/17 0656 08/25/17 1217 08/26/17 0625 08/27/17 0607  HGB 7.4* 7.1* 7.6* 8.6*  HCT 24.0* 23.4* 24.4* 27.8*  PLT 362 334 279 242  LABPROT 23.4*  --  25.2* 27.7*  INR 2.10  --  2.31 2.61  CREATININE 0.56*  --  0.54* 0.57*    Estimated Creatinine Clearance: 90.6 mL/min (A) (by C-G formula based on SCr of 0.57 mg/dL (L)).   Medications:  Scheduled:  . Chlorhexidine Gluconate Cloth  6 each Topical Q0600  . feeding supplement (GLUCERNA SHAKE)  237 mL Oral TID BM  . guaiFENesin  600 mg Oral BID  . ipratropium-albuterol  3 mL Nebulization Q6H WA  . loratadine  10 mg Oral Daily  . mupirocin ointment  1 application Nasal BID  . pravastatin  20 mg Oral Daily  . senna  1 tablet Oral BID  . sodium chloride flush  3 mL Intravenous Q12H  . tamsulosin  0.4 mg Oral Daily  . warfarin  2.5 mg Oral q1800  . Warfarin - Pharmacist Dosing Inpatient   Does not apply q1800   Infusions:  . sodium chloride    . ceFEPime (MAXIPIME) IV Stopped (08/27/17 1108)  . vancomycin 1,000 mg (08/27/17 1221)   PRN:   Assessment: 72 yo male on chronic warfarin for hx afib and DVT/PE  and Afib.  Home dose reported as 2.5mg  PO daily.  INR therapeutic on admission.  Pharmacy consulted to continue dosing on admission 1/18  Today, 08/27/17  INR continues to be therapeutic on home regimen  CBC: hgb low but improved after PRBC x 1 on 1/20  No reported bleeding despite anemia  Goal of Therapy:  INR 2-3 Monitor platelets by anticoagulation protocol: Yes   Plan:   Warfarin 2 mg PO x 1 tonight (slightly reducing dose given broad spectrum abx)  Daily INR  CBC at least q72 hr while on warfarin inpatient   Reuel Boom, PharmD, BCPS [phone] 08/27/2017, 2:24 PM

## 2017-08-27 NOTE — Progress Notes (Signed)
Pharmacy Antibiotic Note  William Stanley is a 72 y.o. male with lung cancer on chemotherapy, most recent treatment on Monday, admitted on 08/23/2017 with postobstructive PNA  Pharmacy has been consulted for vancomycin and cefepime dosing. Last night in ED, received Vancomycin 1g, Aztreonam 2g and Levofloxacin 750mg  x 1 dose each.  Today, 08/27/2017:  WBC now low  SCr stable  Plan:  Continue Vancomycin 1g IV q12h (estimated AUC 492)  Will check AUC tomorrow AM  Continue Cefepime 1g IV q8h  Follow up renal function & cultures    Temp (24hrs), Avg:97.8 F (36.6 C), Min:97.7 F (36.5 C), Max:97.9 F (36.6 C)  Recent Labs  Lab 08/23/17 2021 08/23/17 2037 08/23/17 2240 08/25/17 0656 08/25/17 1217 08/26/17 0625 08/27/17 0607  WBC 13.6*  --   --  4.0 3.9* 3.3* 2.7*  CREATININE 0.67  --   --  0.56*  --  0.54* 0.57*  LATICACIDVEN  --  1.67 1.45  --   --   --   --     Estimated Creatinine Clearance: 90.6 mL/min (A) (by C-G formula based on SCr of 0.57 mg/dL (L)).    Allergies  Allergen Reactions  . Fish Allergy     Sweaty, upset stomach  . Penicillins     Blackouts  Has patient had a PCN reaction causing immediate rash, facial/tongue/throat swelling, SOB or lightheadedness with hypotension: Yes Has patient had a PCN reaction causing severe rash involving mucus membranes or skin necrosis: No Has patient had a PCN reaction that required hospitalization: No Has patient had a PCN reaction occurring within the last 10 years: No If all of the above answers are "NO", then may proceed with Cephalosporin use.     Antimicrobials this admission:  1/17 Aztreonam x 1 1/17 Levaquin x 1 1/17 Vancomycin >> 1/18 Cefepime >>  Dose adjustments this admission:   Microbiology results:  1/17 BCx: ngtd 1/18 UCx: mx spp 1/18 Sputum: normal flora 1/18 MRSA PCR: positive  Thank you for allowing pharmacy to be a part of this patient's care.  Reuel Boom, PharmD,  BCPS [phone] 08/27/2017, 2:41 PM

## 2017-08-27 NOTE — Progress Notes (Signed)
PROGRESS NOTE    William Stanley  MRN:[id number] DOB: 11/13/1945 DOA: 08/23/2017 PCP: Dixon, Mary B, PA-C   Brief Narrative: 72 y.o.malewith h/o Lung Ca on ChemoRx, h/oDVT/PE, A. fib on chronic anticoagulation(Coumadin),who presented with complaints of back and leg pain.Patient is a poor historian, history obtained from patient's sister at bedside, ,no vomiting no diarrhea, no frank chest pains,patient had tachycardia, leukocytosis and a fever of 100.8 on admission, patient had increased oxygen requirement, increased work of breathing, patient met sepsis criteria on admission,lactic acid was not elevated.Sepsis is presumed to be secondary to combination of pneumonia and possible UTI,Ultimately patient had CT scan of the abdomen which showed a postobstructive pneumonia with bronchopulmonary fistula.  In ED....Patient receivedvancomycin,Levaquin and azactamdue to concerns about penicillin allergy  1/21-patient ompliants of sob,cough.his sister is in the room with him.  Assessment & Plan:   Principal Problem:   Sepsis (HCC) Active Problems:   Atrial fibrillation (HCC)   Warfarin anticoagulation   Diabetes mellitus type 2, uncomplicated (HCC)   Stage IV squamous cell carcinoma of left lung (HCC)   PNA (pneumonia)   UTI (urinary tract infection)   Bronchopleural fistula (HCC)  1]acute hypoxic respiratory failure secondary to healthcare associated pneumonia-CT scan of the chest shows new postobstructive pneumonia throughout the left lung. With left hilar adenopathy. And mild mediastinal adenopathy. CT scan of the chest showsNo evidence of pulmonary embolism on this limited motion degraded scan. 2. Cavitary left lower lobe lung mass is decreased in size. 3. 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 4. New/increased postobstructive pneumonia throughout  the left lung. 5. Left hilar adenopathy is decreased. 6. Mediastinal adenopathy is mildly increased, cannot exclude progressive metastatic mediastinal adenopathy. 7. Left main and 3 vessel coronary atherosclerosis  -Patient started on vancomycin and aztreonam.   2]history of PE DVT on Coumadin  3]history of A. fib on Coumadin  4]type 2 diabetes takes metformin at home which is on hold.  5]hypertension he does not take any medications at home will place him on IV Lopressor as needed.  6history of lung cancer status post recent chemotherapy 5 days ago..She is followed byby drMcquaid.He has an appointment to see him at the end of the month. P CCM consult has been placed since admission and await the recommendations.  7]anemia patient has a history of anemia of chronic disease.  Status post blood transfusion his hemoglobin increased to 8.6 from 7.6 after transfusion.   8] hypomagnesemia replete      DVT prophylaxis: Coumadin Code Status: DO NOT RESUSCITATE Family Communication: Discussed with patient and patient's sister in the room.  They agreed for palliative care consult. Disposition Plan TBD Consultants:  P CCM Procedures: None Antimicrobials: Aztreonam and vancomycin Subjective: Cough better but still has cough and shortness of breath   Objective: Vitals:   08/26/17 1928 08/26/17 2149 08/27/17 0557 08/27/17 0824  BP:  108/69 127/75   Pulse:  97 97   Resp:  18 20   Temp:  97.7 F (36.5 C) 97.9 F (36.6 C)   TempSrc:  Oral Oral   SpO2: 98% 98% 92% 92%    Intake/Output Summary (Last 24 hours) at 08/27/2017 1005 Last data filed at 08/27/2017 0605 Gross per 24 hour  Intake 640 ml  Output 1025 ml  Net -385 ml   There were no vitals filed for this visit.  Examination:  General exam: Appears calm and comfortable  Respiratory system:rhonchi right base    to auscultation. Respiratory effort normal. Cardiovascular system: S1 & S2 heard, RRR. No  JVD, murmurs, rubs, gallops or clicks. No pedal edema. Gastrointestinal system: Abdomen is nondistended, soft and nontender. No organomegaly or masses felt. Normal bowel sounds heard. Central nervous system: Alert and oriented. No focal neurological deficits. Extremities: Symmetric 5 x 5 power. Skin: No rashes, lesions or ulcers Psychiatry: Judgement and insight appear normal. Mood & affect appropriate.     Data Reviewed: I have personally reviewed following labs and imaging studies  CBC: Recent Labs  Lab 08/23/17 2021 08/25/17 0656 08/25/17 1217 08/26/17 0625 08/27/17 0607  WBC 13.6* 4.0 3.9* 3.3* 2.7*  NEUTROABS 13.0*  --   --  2.7 2.0  HGB 9.4* 7.4* 7.1* 7.6* 8.6*  HCT 30.3* 24.0* 23.4* 24.4* 27.8*  MCV 89.6 90.6 91.1 89.4 90.3  PLT 496* 362 334 279 242   Basic Metabolic Panel: Recent Labs  Lab 08/23/17 2021 08/25/17 0656 08/26/17 0625 08/27/17 0607  NA 134* 135 134* 136  K 3.6 3.2* 3.5 3.5  CL 97* 100* 99* 98*  CO2 27 28 30 32  GLUCOSE 109* 108* 106* 106*  BUN 15 10 6 7  CREATININE 0.67 0.56* 0.54* 0.57*  CALCIUM 8.8* 8.2* 8.3* 8.6*  MG  --   --  1.6* 2.0  PHOS  --   --  3.1 3.0   GFR: Estimated Creatinine Clearance: 90.6 mL/min (A) (by C-G formula based on SCr of 0.57 mg/dL (L)). Liver Function Tests: Recent Labs  Lab 08/23/17 2021 08/27/17 0607  AST 29 301*  ALT 20 136*  ALKPHOS 68 123  BILITOT 0.9 0.9  PROT 7.3 6.2*  ALBUMIN 2.3* 1.8*   No results for input(s): LIPASE, AMYLASE in the last 168 hours. No results for input(s): AMMONIA in the last 168 hours. Coagulation Profile: Recent Labs  Lab 08/23/17 2040 08/25/17 0656 08/26/17 0625 08/27/17 0607  INR 2.40 2.10 2.31 2.61   Cardiac Enzymes: No results for input(s): CKTOTAL, CKMB, CKMBINDEX, TROPONINI in the last 168 hours. BNP (last 3 results) No results for input(s): PROBNP in the last 8760 hours. HbA1C: No results for input(s): HGBA1C in the last 72 hours. CBG: Recent Labs  Lab  08/24/17 1731  GLUCAP 104*   Lipid Profile: No results for input(s): CHOL, HDL, LDLCALC, TRIG, CHOLHDL, LDLDIRECT in the last 72 hours. Thyroid Function Tests: No results for input(s): TSH, T4TOTAL, FREET4, T3FREE, THYROIDAB in the last 72 hours. Anemia Panel: No results for input(s): VITAMINB12, FOLATE, FERRITIN, TIBC, IRON, RETICCTPCT in the last 72 hours. Sepsis Labs: Recent Labs  Lab 08/23/17 2037 08/23/17 2240  LATICACIDVEN 1.67 1.45    Recent Results (from the past 240 hour(s))  Blood Culture (routine x 2)     Status: None (Preliminary result)   Collection Time: 08/23/17  8:21 PM  Result Value Ref Range Status   Specimen Description BLOOD LEFT FOREARM  Final   Special Requests   Final    BOTTLES DRAWN AEROBIC AND ANAEROBIC Blood Culture adequate volume   Culture   Final    NO GROWTH 3 DAYS Performed at [facility], [address] [place], [place] [address]    Report Status PENDING  Incomplete  Blood Culture (routine x 2)     Status: None (Preliminary result)   Collection Time: 08/23/17  8:21 PM  Result Value Ref Range Status   Specimen Description BLOOD RIGHT ANTECUBITAL  Final   Special Requests   Final    BOTTLES DRAWN   AEROBIC AND ANAEROBIC Blood Culture adequate volume   Culture   Final    NO GROWTH 3 DAYS Performed at [facility], [address] [place], [place] [address]    Report Status PENDING  Incomplete  Urine culture     Status: Abnormal   Collection Time: 08/24/17  7:27 AM  Result Value Ref Range Status   Specimen Description URINE, CATHETERIZED  Final   Special Requests NONE  Final   Culture MULTIPLE SPECIES PRESENT, SUGGEST RECOLLECTION (A)  Final   Report Status 08/25/2017 FINAL  Final  Culture, expectorated sputum-assessment     Status: None   Collection Time: 08/24/17 12:15 PM  Result Value Ref Range Status   Specimen Description EXPECTORATED SPUTUM  Final   Special Requests NONE  Final   Sputum evaluation THIS SPECIMEN IS  ACCEPTABLE FOR SPUTUM CULTURE  Final   Report Status 08/24/2017 FINAL  Final  Culture, respiratory (NON-Expectorated)     Status: None (Preliminary result)   Collection Time: 08/24/17 12:15 PM  Result Value Ref Range Status   Specimen Description EXPECTORATED SPUTUM  Final   Special Requests NONE Reflexed from [id number]  Final   Gram Stain   Final    FEW WBC PRESENT, PREDOMINANTLY PMN ABUNDANT GRAM POSITIVE COCCI IN CLUSTERS MODERATE GRAM NEGATIVE RODS FEW GRAM POSITIVE RODS    Culture   Final    CULTURE REINCUBATED FOR BETTER GROWTH Performed at [facility], [address] [place], [place] [address]    Report Status PENDING  Incomplete  MRSA PCR Screening     Status: Abnormal   Collection Time: 08/24/17  2:10 PM  Result Value Ref Range Status   MRSA by PCR POSITIVE (A) NEGATIVE Final    Comment:        The GeneXpert MRSA Assay (FDA approved for NASAL specimens only), is one component of a comprehensive MRSA colonization surveillance program. It is not intended to diagnose MRSA infection nor to guide or monitor treatment for MRSA infections. RESULT CALLED TO, READ BACK BY AND VERIFIED WITH: FRASER,B @ 1803 ON [id number] BY POTEAT,S          Radiology Studies: Dg Chest 2 View  Result Date: 08/26/2017 CLINICAL DATA:  Pitting edema. EXAM: CHEST  2 VIEW COMPARISON:  Chest CT 08/24/2017 FINDINGS: The cardiac silhouette is partially obscured by dense left lower lobe airspace consolidation and left hydropneumothorax. The aeration of the left lung has slightly worsened when compared to 08/23/2017. Patchy peribronchial airspace opacities are now seen in the left upper lobe and left middle lobe in addition to the left lower lobe malignant process. Mild atelectasis versus airspace consolidation in the right lower lobe. Right apical peribronchial thickening. IMPRESSION: Slight worsening of the aeration of the left lung with known left lower lobe postobstructive airspace consolidation  and malignant left hydropneumothorax. Slight increase in peribronchial airspace consolidation in the left middle and left upper lobes. Electronically Signed   By: Dobrinka  Dimitrova M.D.   On: 08/26/2017 11:06        Scheduled Meds: . feeding supplement (GLUCERNA SHAKE)  237 mL Oral TID BM  . guaiFENesin  600 mg Oral BID  . ipratropium-albuterol  3 mL Nebulization Q6H WA  . loratadine  10 mg Oral Daily  . pravastatin  20 mg Oral Daily  . senna  1 tablet Oral BID  . sodium chloride flush  3 mL Intravenous Q12H  . tamsulosin  0.4 mg Oral Daily  . warfarin    2.5 mg Oral q1800  . Warfarin - Pharmacist Dosing Inpatient   Does not apply q1800   Continuous Infusions: . sodium chloride    . ceFEPime (MAXIPIME) IV Stopped (08/27/17 0330)  . vancomycin Stopped (08/26/17 2202)     LOS: 3 days      Elizabeth G Mathews, MD [facility]  If 7PM-7AM, please contact night-coverage [email or web address] Password [email or web address] 08/27/2017, 10:05 AM  

## 2017-08-27 NOTE — Care Management Note (Signed)
Case Management Note  Patient Details  Name: William Stanley MRN: [id number] Date of Birth: [birth year]  Subjective/Objective:                  pna  Action/Plan: Date:  August 27, 2017 Chart reviewed for concurrent status and case management needs.  Will continue to follow patient progress.  Discharge Planning: following for needs.  None present at this time of review. Expected discharge date: August 30, 2017 Velva Harman, BSN, [place], [place]   Expected Discharge Date:  (unknown)               Expected Discharge Plan:  Home/Self Care  In-House Referral:     Discharge planning Services  CM Consult  Post Acute Care Choice:    Choice offered to:     DME Arranged:    DME Agency:     HH Arranged:    HH Agency:     Status of Service:  In process, will continue to follow  If discussed at Long Length of Stay Meetings, dates discussed:    Additional Comments:  Leeroy Cha, RN 08/27/2017, 10:51 AM

## 2017-08-27 NOTE — Consult Note (Signed)
PULMONARY / CRITICAL CARE MEDICINE   Name: William Stanley MRN: [id number] DOB: 04-17-1946 PCP [name], PA-C LOS 3 as of 08/27/2017     ADMISSION DATE:  08/23/2017 CONSULTATION DATE:  *08/25/17  REFERRING MD:  Roxan Hockey  CHIEF COMPLAINT:  BP fistula  HISTORY OF PRESENT ILLNESS:   72 year old male who in October/November 2018 was diagnosed with stage IV non-small cell lung cancer by Dr. Roselie Awkward with a huge left lower lobe mass.  He has seen Dr. Julien Nordmann and is getting carboplatin Taxol chemotherapy.  He attempted to get radiation but could not look tolerate lying down flat.  Information obtained from family at bedside.  The best I can gather is August 20, 2017 he did get his second cycle of chemotherapy and then he went home started feeling fatigued and then by Wednesday, August 22, 2017 he started picking up fever and cough and phlegm and therefore he got admitted.  He is being treated for pneumonia and is beginning to feel better.  CT scan of the chest shows that the left lower lobe mass although might be shrinking in size has cavitated and discussed of bronchopleural fistula although there is no effusion documented.  I personally visualized the CT scan and agree with the findings.  Pulmonary has now been consulted because of this.  In talking to his bedside nurse and his girlfriend it appears his baseline ECOG might be 3 that has been noticing shortness of breath with exertion and is needing a walker.  Most of the time he is sedentary.   SUBJECTIVE:  Pt reports ongoing cough > "nothing works"  VITAL SIGNS: BP 113/66 (BP Location: Left Arm)   Pulse 88   Temp 97.7 F (36.5 C) (Oral)   Resp 20   SpO2 98%   HEMODYNAMICS:    VENTILATOR SETTINGS:    INTAKE / OUTPUT: I/O last 3 completed shifts: In: 1577.5 [P.O.:510; I.V.:370; Blood:297.5; IV Piggyback:400] Out: 1025 [Urine:1025]   EXAM General: elderly male in NAD HEENT: MM pink/moist PSY: calm  Neuro: alert  / oriented, MAE CV: s1s2 rrr, no m/r/g PULM: even/non-labored, lungs bilaterally coarse, rhonchi in bases  GI: soft, non-tender, bsx4 active  Extremities: warm/dry, no edema  Skin: no rashes or lesions  LABS  PULMONARY No results for input(s): PHART, PCO2ART, PO2ART, HCO3, TCO2, O2SAT in the last 168 hours.  Invalid input(s): PCO2, PO2  CBC Recent Labs  Lab 08/25/17 1217 08/26/17 0625 08/27/17 0607  HGB 7.1* 7.6* 8.6*  HCT 23.4* 24.4* 27.8*  WBC 3.9* 3.3* 2.7*  PLT 334 279 242    COAGULATION Recent Labs  Lab 08/23/17 2040 08/25/17 0656 08/26/17 0625 08/27/17 0607  INR 2.40 2.10 2.31 2.61    CARDIAC  No results for input(s): TROPONINI in the last 168 hours. No results for input(s): PROBNP in the last 168 hours.   CHEMISTRY Recent Labs  Lab 08/23/17 2021 08/25/17 0656 08/26/17 0625 08/27/17 0607  NA 134* 135 134* 136  K 3.6 3.2* 3.5 3.5  CL 97* 100* 99* 98*  CO2 27 28 30  32  GLUCOSE 109* 108* 106* 106*  BUN 15 10 6 7   CREATININE 0.67 0.56* 0.54* 0.57*  CALCIUM 8.8* 8.2* 8.3* 8.6*  MG  --   --  1.6* 2.0  PHOS  --   --  3.1 3.0   Estimated Creatinine Clearance: 90.6 mL/min (A) (by C-G formula based on SCr of 0.57 mg/dL (L)).   LIVER Recent Labs  Lab 08/23/17 2021  08/23/17 2040 08/25/17 0656 08/26/17 0625 08/27/17 0607  AST 29  --   --   --  301*  ALT 20  --   --   --  136*  ALKPHOS 68  --   --   --  123  BILITOT 0.9  --   --   --  0.9  PROT 7.3  --   --   --  6.2*  ALBUMIN 2.3*  --   --   --  1.8*  INR  --  2.40 2.10 2.31 2.61     INFECTIOUS Recent Labs  Lab 08/23/17 2037 08/23/17 2240  LATICACIDVEN 1.67 1.45     ENDOCRINE CBG (last 3)  Recent Labs    08/24/17 1731  GLUCAP 104*     IMAGING  Dg Chest 2 View  Result Date: 08/26/2017 CLINICAL DATA:  Pitting edema. EXAM: CHEST  2 VIEW COMPARISON:  Chest CT 08/24/2017 FINDINGS: The cardiac silhouette is partially obscured by dense left lower lobe airspace consolidation and  left hydropneumothorax. The aeration of the left lung has slightly worsened when compared to 08/23/2017. Patchy peribronchial airspace opacities are now seen in the left upper lobe and left middle lobe in addition to the left lower lobe malignant process. Mild atelectasis versus airspace consolidation in the right lower lobe. Right apical peribronchial thickening. IMPRESSION: Slight worsening of the aeration of the left lung with known left lower lobe postobstructive airspace consolidation and malignant left hydropneumothorax. Slight increase in peribronchial airspace consolidation in the left middle and left upper lobes. Electronically Signed   By: Fidela Salisbury M.D.   On: 08/26/2017 11:06    ASSESSMENT and PLAN  Bronchopleural Fistula - new BP fistula in the setting of cavitation of LLL lung cancer with penetration into his pleural space.    Stage IV Non-Small Cell Lung Cancer - s/p 2 cycles of chemotherapy   Post Obstructive PNA (L)   Hx DVT on Coumadin   Plan: Not a candidate for resection given advanced lung cancer  Monitor for development of pleural fluid, if he develops consider palliative drainage Broad spectrum antibiotics for PNA  Follow up with Dr. Earlie Server for decision regarding further chemotherapy / second line immunotherapy  Agree with palliative care input  Will try one time dose tramadol for cough    FAMILY  - Updates: No family at bedside 1/21.   - [facility] - DNR status.  Family pending palliative care meeting.     PCCM will be available PRN.  Please call back if new needs arise.    Noe Gens, NP-C [facility] Pgr: [phone] or if no answer [phone] 08/27/2017, 5:16 PM

## 2017-08-28 ENCOUNTER — Inpatient Hospital Stay (HOSPITAL_COMMUNITY): Payer: Medicare HMO

## 2017-08-28 ENCOUNTER — Other Ambulatory Visit: Payer: Medicare HMO

## 2017-08-28 DIAGNOSIS — C3492 Malignant neoplasm of unspecified part of left bronchus or lung: Secondary | ICD-10-CM

## 2017-08-28 DIAGNOSIS — C3432 Malignant neoplasm of lower lobe, left bronchus or lung: Secondary | ICD-10-CM

## 2017-08-28 DIAGNOSIS — Z7189 Other specified counseling: Secondary | ICD-10-CM

## 2017-08-28 DIAGNOSIS — J188 Other pneumonia, unspecified organism: Secondary | ICD-10-CM

## 2017-08-28 DIAGNOSIS — Z515 Encounter for palliative care: Secondary | ICD-10-CM

## 2017-08-28 DIAGNOSIS — D6481 Anemia due to antineoplastic chemotherapy: Secondary | ICD-10-CM

## 2017-08-28 DIAGNOSIS — D701 Agranulocytosis secondary to cancer chemotherapy: Secondary | ICD-10-CM

## 2017-08-28 LAB — BASIC METABOLIC PANEL
Anion gap: 6 (ref 5–15)
BUN: 8 mg/dL (ref 6–20)
CHLORIDE: 97 mmol/L — AB (ref 101–111)
CO2: 31 mmol/L (ref 22–32)
CREATININE: 0.56 mg/dL — AB (ref 0.61–1.24)
Calcium: 8.3 mg/dL — ABNORMAL LOW (ref 8.9–10.3)
GFR calc non Af Amer: >60 mL/min (ref >60)
Glucose, Bld: 115 mg/dL — ABNORMAL HIGH (ref 65–99)
POTASSIUM: 3.4 mmol/L — AB (ref 3.5–5.1)
Sodium: 134 mmol/L — ABNORMAL LOW (ref 135–145)

## 2017-08-28 LAB — CBC WITH DIFFERENTIAL/PLATELET
BASOS ABS: 0 10*3/uL (ref 0.0–0.1)
BASOS PCT: 1 %
Eosinophils Absolute: 0.1 10*3/uL (ref 0.0–0.7)
Eosinophils Relative: 6 %
HEMATOCRIT: 24.9 % — AB (ref 39.0–52.0)
HEMOGLOBIN: 7.8 g/dL — AB (ref 13.0–17.0)
LYMPHS PCT: 22 %
Lymphs Abs: 0.4 10*3/uL — ABNORMAL LOW (ref 0.7–4.0)
MCH: 28.1 pg (ref 26.0–34.0)
MCHC: 31.3 g/dL (ref 30.0–36.0)
MCV: 89.6 fL (ref 78.0–100.0)
MONOS PCT: 23 %
Monocytes Absolute: 0.4 10*3/uL (ref 0.1–1.0)
Neutro Abs: 1 10*3/uL — ABNORMAL LOW (ref 1.7–7.7)
Neutrophils Relative %: 48 %
Platelets: 212 10*3/uL (ref 150–400)
RBC: 2.78 MIL/uL — ABNORMAL LOW (ref 4.22–5.81)
RDW: 17.9 % — ABNORMAL HIGH (ref 11.5–15.5)
WBC: 1.9 10*3/uL — ABNORMAL LOW (ref 4.0–10.5)

## 2017-08-28 LAB — PHOSPHORUS: Phosphorus: 2.8 mg/dL (ref 2.5–4.6)

## 2017-08-28 LAB — PROTIME-INR
INR: 2.3
Prothrombin Time: 25.1 seconds — ABNORMAL HIGH (ref 11.4–15.2)

## 2017-08-28 LAB — VANCOMYCIN, TROUGH: Vancomycin Tr: 18 ug/mL (ref 15–20)

## 2017-08-28 LAB — CULTURE, BLOOD (ROUTINE X 2)
CULTURE: NO GROWTH
Culture: NO GROWTH
SPECIAL REQUESTS: ADEQUATE
Special Requests: ADEQUATE

## 2017-08-28 LAB — VANCOMYCIN, PEAK: VANCOMYCIN PK: 24 ug/mL — AB (ref 30–40)

## 2017-08-28 LAB — MAGNESIUM: Magnesium: 1.8 mg/dL (ref 1.7–2.4)

## 2017-08-28 MED ORDER — HYDROCODONE-HOMATROPINE 5-1.5 MG/5ML PO SYRP
5.0000 mL | ORAL_SOLUTION | ORAL | Status: DC | PRN
  Administered 2017-08-28 – 2017-08-31 (×5): 5 mL via ORAL
  Filled 2017-08-28 (×5): qty 5

## 2017-08-28 MED ORDER — WARFARIN SODIUM 2.5 MG PO TABS
2.5000 mg | ORAL_TABLET | Freq: Once | ORAL | Status: AC
  Administered 2017-08-28: 2.5 mg via ORAL
  Filled 2017-08-28: qty 1

## 2017-08-28 NOTE — Consult Note (Signed)
Consultation Note Date: 08/28/2017   Patient Name: William Stanley  DOB: September 22, 1945  MRN: [id number]  Age / Sex: 72 y.o., male  PCP: William Sheldon, PA-C Referring Physician: Georgette Shell, MD  Reason for Consultation: Establishing goals of care  HPI/Patient Profile: 72 y.o. male  with past medical history of stage 4 non small cell lung cancer on chemotherapy, afib, PE/DVT on coumadin, DVT, who was admitted on 08/23/2017 with post obstructive pneumonia and UTI with sepsis.  He also has a broncho-pulmonary fistula.  Clinical Assessment and Goals of Care:  I have reviewed medical records including EPIC notes, labs and imaging, received report from the care team, assessed the patient and then met at the bedside along with his brother William Stanley, care taker William Stanley, and sister in law William Stanley  to discuss diagnosis prognosis, [place], EOL wishes, disposition and options.  I also talked on the phone with his sister William Stanley.  Of interest William Stanley is married but he has been separated from his wife for 24 years.  His very close friend and neighbor, William Stanley, cares for him in his home.  He has 3 children who live in [place] and are unable to help care for his personal needs.  Currently his default Chauncey Reading is his wife.  William Stanley plans to speak with his wife tomorrow to determine if he wants to change his HCPOA.    I introduced Palliative Medicine as specialized medical care for people living with serious illness. It focuses on providing relief from the symptoms and stress of a serious illness. The goal is to improve quality of life for both the patient and the family.  As far as functional and nutritional status he is eating only small amounts.  Prior to admission he was moving around his trailer with a rolling walker.  He has not been up and walking since admission.  He is uncertain of what he can do at this point.   We  discussed his current illness and what it means in the larger context of his on-going co-morbidities.  Natural disease trajectory and expectations at EOL were discussed.  William Stanley understands that he has pneumonia and a fistula ("hole" in his lung).  We discussed recurrent pneumonia that would happen as a result of his cancer and fistula.    He understands time is short.  He wants very much to be in his own home.  He is opposed to going to any nursing facility or [facility].  Unfortunately his care taker William Stanley is unable to lift him and his family is unable to assist with bathing/toileting, etc...   Still William Stanley is determined to get home.  I attempted to elicit values and goals of care important to the patient.  He does not want to "Lay around" and have a prolonged dying process.  He does not want to lay in bed.    At this point William Stanley believes that Oncology may still have treatment options.  Before we can discuss Hospice, William Stanley  needs to know his treatment options.  I believe he will need a significant amount of support to go home.  I spoke privately with William Stanley/William Stanley/and William Stanley.  I was very frank with them about William Stanley prognosis.  They understood and were not surprised.  Advanced directives, concepts specific to code status, artifical feeding and hydration, and rehospitalization were considered and discussed.  William Stanley requests DNR.  Hospice and Palliative Care services outpatient were explained and offered.  He will consider and appreciate Hospice care in his home, but wants to know if there are further treatment options.   Primary Decision Maker:  PATIENT    SUMMARY OF RECOMMENDATIONS    Patient will determine HCPOA - estranged wife? Or other family member. DNR.  "I do not want to just lay around in the bed" Please consult Oncology to determine if there are options for additional chemo / radiation. Patient would like to accept Hospice Services and go home (if  possible) if there is no further Oncology treatment. PT eval to determine what type of support he will need at home.  Code Status/Advance Care Planning:  DNR  Prognosis:  Less than 6 months due to metastatic non small cell lung cancer, obstructive pneumonia, broncho-pulmonary fistula, rapid recent decline, poor mobility status, poor PO intake.    Discharge Planning: To Be Determined      Primary Diagnoses: Present on Admission: . Sepsis ([place]) . PNA (pneumonia) . Atrial fibrillation ([place]) . Stage IV squamous cell carcinoma of left lung ([place]) . UTI (urinary tract infection)   I have reviewed the medical record, interviewed the patient and family, and examined the patient. The following aspects are pertinent.  Past Medical History:  Diagnosis Date  . Atrial fibrillation ([place])   . Diabetes ([place])   . DVT (deep venous thrombosis) ([place])   . Edema of lower extremity   . Hypertension   . Irritable bowel syndrome (IBS)   . Lung collapse    LEFT LUNG, TWICE  . Pulmonary embolism [facility])    Social History   Socioeconomic History  . Marital status: Married    Spouse name: None  . Number of children: None  . Years of education: None  . Highest education level: None  Social Needs  . Financial resource strain: None  . Food insecurity - worry: None  . Food insecurity - inability: None  . Transportation needs - medical: None  . Transportation needs - non-medical: None  Occupational History  . None  Tobacco Use  . Smoking status: Former Smoker    Packs/day: 0.25    Years: 15.00    Pack years: 3.75    Types: Cigarettes    Last attempt to quit: 07/07/2012    Years since quitting: 5.1  . Smokeless tobacco: Never Used  Substance and Sexual Activity  . Alcohol use: No    Alcohol/week: 0.0 oz  . Drug use: No  . Sexual activity: None  Other Topics Concern  . None  Social History Narrative  . None   Family History  Problem Relation Age of Onset  . Heart attack Mother     . Heart failure Mother   . Arrhythmia Father   . Heart failure Father    Scheduled Meds: . Chlorhexidine Gluconate Cloth  6 each Topical Q0600  . feeding supplement (GLUCERNA SHAKE)  237 mL Oral TID BM  . guaiFENesin  600 mg Oral BID  . ipratropium-albuterol  3 mL Nebulization Q6H WA  . loratadine  10  mg Oral Daily  . mupirocin ointment  1 application Nasal BID  . pravastatin  20 mg Oral Daily  . senna  1 tablet Oral BID  . sodium chloride flush  3 mL Intravenous Q12H  . tamsulosin  0.4 mg Oral Daily  . warfarin  2.5 mg Oral ONCE-1800  . Warfarin - Pharmacist Dosing Inpatient   Does not apply q1800   Continuous Infusions: . sodium chloride    . ceFEPime (MAXIPIME) IV Stopped (08/28/17 1014)   PRN Meds:.sodium chloride, acetaminophen **OR** acetaminophen, albuterol, bismuth subsalicylate, docusate sodium, HYDROcodone-homatropine, ondansetron **OR** ondansetron (ZOFRAN) IV, oxyCODONE-acetaminophen, polyethylene glycol, sodium chloride flush, traZODone Allergies  Allergen Reactions  . Fish Allergy     Sweaty, upset stomach  . Penicillins     Blackouts  Has patient had a PCN reaction causing immediate rash, facial/tongue/throat swelling, SOB or lightheadedness with hypotension: Yes Has patient had a PCN reaction causing severe rash involving mucus membranes or skin necrosis: No Has patient had a PCN reaction that required hospitalization: No Has patient had a PCN reaction occurring within the last 10 years: No If all of the above answers are "NO", then may proceed with Cephalosporin use.    Review of Systems SOB, severe cough with malodorous sputum  Physical Exam  Well developed chronically ill appearing gentleman, very pleasant, coherent CV rrr resp on N/C with severe dyspnea during and after coughing spells. Abdomen soft.   Vital Signs: BP (!) 114/58 (BP Location: Right Arm)   Pulse 100   Temp 98.4 F (36.9 C) (Oral)   Resp 18   SpO2 98%  Pain Assessment: No/denies  pain   Pain Score: 4    SpO2: SpO2: 98 % O2 Device:SpO2: 98 % O2 Flow Rate: .O2 Flow Rate (L/min): 2 L/min  IO: Intake/output summary:   Intake/Output Summary (Last 24 hours) at 08/28/2017 1519 Last data filed at 08/28/2017 0500 Gross per 24 hour  Intake 60 ml  Output -  Net 60 ml    LBM: Last BM Date: 08/23/17 Baseline Weight:   Most recent weight:       Palliative Assessment/Data: 20%     Time In: 12:30 Time Out: 2:00 Time Total: 90 min. Greater than 50%  of this time was spent counseling and coordinating care related to the above assessment and plan.  Signed by: Florentina Jenny, PA-C Palliative Medicine Pager: [phone]  Please contact Palliative Medicine Team phone at [phone] for questions and concerns.  For individual provider: See Shea Evans

## 2017-08-28 NOTE — Care Management Important Message (Signed)
Important Message  Patient Details IM Letter given to Rhonda/Case Manager to present to the Patient Name: William Stanley MRN: [id number] Date of Birth: 05-31-1946   Medicare Important Message Given:  Yes    Kerin Salen 08/28/2017, 11:05 AM

## 2017-08-28 NOTE — Progress Notes (Signed)
PROGRESS NOTE    William Stanley  XTG:[id number] DOB: 03-Jul-1946 DOA: 08/23/2017 PCP: [name], PA-C  Brief Narrative: 71 y.o.malewith h/o Lung Ca on ChemoRx, h/oDVT/PE, A. fib on chronic anticoagulation(Coumadin),who presented with complaints of back and leg pain.Patient is a poor historian, history obtained from patient's sister at bedside, ,no vomiting no diarrhea, no frank chest pains,patient had tachycardia, leukocytosis and a fever of 100.8 on admission, patient had increased oxygen requirement, increased work of breathing, patient met sepsis criteria on admission,lactic acid was not elevated.Sepsis is presumed to be secondary to combination of pneumonia and possible UTI,Ultimately patient had CT scan of the abdomen which showed a postobstructive pneumonia with bronchopulmonary fistula.  In ED.Marland KitchenMarland KitchenMarland KitchenPatient receivedvancomycin,Levaquin and azactamdue to concerns about penicillin allergy  1/21-patient ompliants of sob,cough.his sister is in the room with him.  1/22-patient complains of ongoing cough.   Assessment & Plan:   Principal Problem:   Sepsis ([place]) Active Problems:   Atrial fibrillation (HCC)   Warfarin anticoagulation   Diabetes mellitus type 2, uncomplicated (HCC)   Stage IV squamous cell carcinoma of left lung (HCC)   PNA (pneumonia)   UTI (urinary tract infection)   Bronchopleural fistula (HCC)  1]acute hypoxic respiratory failure secondary to healthcare associated pneumonia-CT scan of the chest shows new postobstructive pneumonia throughout the left lung. With left hilar adenopathy. And mild mediastinal adenopathy. CT scan of the chest showsNo evidence of pulmonary embolism on this limited motion degraded scan. 2. Cavitary left lower lobe lung mass is decreased in size. 3. New directly visualized BRONCHO PLEURAL FISTULA  in the left lower lobe frankly communicating with loculated malignant basilar left hydropneumothorax. 4.  New/increased postobstructive pneumonia throughout the left lung. 5. Left hilar adenopathy is decreased. 6. Mediastinal adenopathy is mildly increased, cannot exclude progressive metastatic mediastinal adenopathy. 7. Left main and 3 vessel coronary atherosclerosis  -Patient started on vancomycin and aztreonam.  P CCM recommends no further invasive treatment at this time.  Unless patient has pleural effusion that is symptomatic and  palliative thoracentesis could be considered.  2]history of PE DVT on Coumadin  3]history of A. fib on Coumadin  4]type 2 diabetes takes metformin at home which is on hold.  5]hypertension he does not take any medications at home will place him on IV Lopressor as needed.  6history of lung cancer status post recent chemotherapy 5 days ago.  Followed by P CCM.  7]anemia patient has a history of anemia of chronic disease.  Status post blood transfusion his hemoglobin increased to 8.6 from 7.6 after transfusion.   8]hypomagnesemia replete      DVT prophylaxis: Coumadin Code Status: DNR Family Communication: Discussed with the caretaker in the room and the patient. Disposition Plan: Await palliative care recommendations.  Patient has stage IV non-small cell lung cancer with a huge left lower lobe mass now complicated with bronchopleural fistula.  This post chemotherapy x2.  The left lower mass has cavitated and BPF.  Patient is followed by P CCM.  I will get PT and OT evaluation.  Continue IV antibiotics.  His CODE STATUS was changed to DNR. Consultants: P CCM, palliative care pending   Procedures:  Antimicrobials: Vancomycin and aztreonam Subjective: Complaining of cough Objective: Vitals:   08/27/17 0557 08/27/17 0824 08/27/17 1532 08/27/17 2206  BP: 127/75  113/66 (!) 114/58  Pulse: 97  88 100  Resp: 20   18  Temp: 97.9 F (36.6 C)  97.7 F (36.5 C) 98.4 F (36.9 C)  TempSrc: Oral  Oral Oral  SpO2: 92% 92% 98% 98%     Intake/Output Summary (Last 24 hours) at 08/28/2017 1306 Last data filed at 08/28/2017 0500 Gross per 24 hour  Intake 60 ml  Output -  Net 60 ml   There were no vitals filed for this visit.  Examination:  General exam: Appears calm and comfortable  Respiratory system: Clear to auscultation. Respiratory effort normal. Cardiovascular system: S1 & S2 heard, RRR. No JVD, murmurs, rubs, gallops or clicks. No pedal edema. Gastrointestinal system: Abdomen is nondistended, soft and nontender. No organomegaly or masses felt. Normal bowel sounds heard. Central nervous system: Alert and oriented. No focal neurological deficits. Extremities: Symmetric 5 x 5 power. Skin: No rashes, lesions or ulcers Psychiatry: Judgement and insight appear normal. Mood & affect appropriate.     Data Reviewed: I have personally reviewed following labs and imaging studies  CBC: Recent Labs  Lab 08/23/17 2021 08/25/17 0656 08/25/17 1217 08/26/17 0625 08/27/17 0607 08/28/17 0415  WBC 13.6* 4.0 3.9* 3.3* 2.7* 1.9*  NEUTROABS 13.0*  --   --  2.7 2.0 1.0*  HGB 9.4* 7.4* 7.1* 7.6* 8.6* 7.8*  HCT 30.3* 24.0* 23.4* 24.4* 27.8* 24.9*  MCV 89.6 90.6 91.1 89.4 90.3 89.6  PLT 496* 362 334 279 242 035   Basic Metabolic Panel: Recent Labs  Lab 08/23/17 2021 08/25/17 0656 08/26/17 0625 08/27/17 0607 08/28/17 0415  NA 134* 135 134* 136 134*  K 3.6 3.2* 3.5 3.5 3.4*  CL 97* 100* 99* 98* 97*  CO2 _0 32 31  GLUCOSE 109* 108* 106* 106* 115*  BUN _1 CREATININE 0.67 0.56* 0.54* 0.57* 0.56*  CALCIUM 8.8* 8.2* 8.3* 8.6* 8.3*  MG  --   --  1.6* 2.0 1.8  PHOS  --   --  3.1 3.0 2.8   GFR: Estimated Creatinine Clearance: 90.6 mL/min (A) (by C-G formula based on SCr of 0.56 mg/dL (L)). Liver Function Tests: Recent Labs  Lab 08/23/17 2021 08/27/17 0607  AST 29 301*  ALT 20 136*  ALKPHOS 68 123  BILITOT 0.9 0.9  PROT 7.3 6.2*  ALBUMIN 2.3* 1.8*   No results for input(s): LIPASE, AMYLASE  in the last 168 hours. No results for input(s): AMMONIA in the last 168 hours. Coagulation Profile: Recent Labs  Lab 08/23/17 2040 08/25/17 0656 08/26/17 0625 08/27/17 0607 08/28/17 0415  INR 2.40 2.10 2.31 2.61 2.30   Cardiac Enzymes: No results for input(s): CKTOTAL, CKMB, CKMBINDEX, TROPONINI in the last 168 hours. BNP (last 3 results) No results for input(s): PROBNP in the last 8760 hours. HbA1C: No results for input(s): HGBA1C in the last 72 hours. CBG: Recent Labs  Lab 08/24/17 1731  GLUCAP 104*   Lipid Profile: No results for input(s): CHOL, HDL, LDLCALC, TRIG, CHOLHDL, LDLDIRECT in the last 72 hours. Thyroid Function Tests: No results for input(s): TSH, T4TOTAL, FREET4, T3FREE, THYROIDAB in the last 72 hours. Anemia Panel: No results for input(s): VITAMINB12, FOLATE, FERRITIN, TIBC, IRON, RETICCTPCT in the last 72 hours. Sepsis Labs: Recent Labs  Lab 08/23/17 2037 08/23/17 2240  LATICACIDVEN 1.67 1.45    Recent Results (from the past 240 hour(s))  Blood Culture (routine x 2)     Status: None   Collection Time: 08/23/17  8:21 PM  Result Value Ref Range Status   Specimen Description BLOOD LEFT FOREARM  Final   Special Requests   Final    BOTTLES DRAWN AEROBIC AND ANAEROBIC Blood Culture adequate  volume   Culture   Final    NO GROWTH 5 DAYS Performed at [facility], [place] [address], [place], [place] [address]    Report Status 08/28/2017 FINAL  Final  Blood Culture (routine x 2)     Status: None   Collection Time: 08/23/17  8:21 PM  Result Value Ref Range Status   Specimen Description BLOOD RIGHT ANTECUBITAL  Final   Special Requests   Final    BOTTLES DRAWN AEROBIC AND ANAEROBIC Blood Culture adequate volume   Culture   Final    NO GROWTH 5 DAYS Performed at [facility], [place] [address], [place], [place] [address]    Report Status 08/28/2017 FINAL  Final  Urine culture     Status: Abnormal   Collection Time: 08/24/17  7:27 AM  Result  Value Ref Range Status   Specimen Description URINE, CATHETERIZED  Final   Special Requests NONE  Final   Culture MULTIPLE SPECIES PRESENT, SUGGEST RECOLLECTION (A)  Final   Report Status 08/25/2017 FINAL  Final  Culture, expectorated sputum-assessment     Status: None   Collection Time: 08/24/17 12:15 PM  Result Value Ref Range Status   Specimen Description EXPECTORATED SPUTUM  Final   Special Requests NONE  Final   Sputum evaluation THIS SPECIMEN IS ACCEPTABLE FOR SPUTUM CULTURE  Final   Report Status 08/24/2017 FINAL  Final  Culture, respiratory (NON-Expectorated)     Status: None   Collection Time: 08/24/17 12:15 PM  Result Value Ref Range Status   Specimen Description EXPECTORATED SPUTUM  Final   Special Requests NONE Reflexed from [id number]  Final   Gram Stain   Final    FEW WBC PRESENT, PREDOMINANTLY PMN ABUNDANT GRAM POSITIVE COCCI IN CLUSTERS MODERATE GRAM NEGATIVE RODS FEW GRAM POSITIVE RODS    Culture   Final    Consistent with normal respiratory flora. Performed at [facility], [place] [address] [place], [place] [address]    Report Status 08/27/2017 FINAL  Final  MRSA PCR Screening     Status: Abnormal   Collection Time: 08/24/17  2:10 PM  Result Value Ref Range Status   MRSA by PCR POSITIVE (A) NEGATIVE Final    Comment:        The GeneXpert MRSA Assay (FDA approved for NASAL specimens only), is one component of a comprehensive MRSA colonization surveillance program. It is not intended to diagnose MRSA infection nor to guide or monitor treatment for MRSA infections. RESULT CALLED TO, READ BACK BY AND VERIFIED WITH: FRASER,B @ 1803 ON [id number] BY POTEAT,S          Radiology Studies: No results found.      Scheduled Meds: . Chlorhexidine Gluconate Cloth  6 each Topical Q0600  . feeding supplement (GLUCERNA SHAKE)  237 mL Oral TID BM  . guaiFENesin  600 mg Oral BID  . ipratropium-albuterol  3 mL Nebulization Q6H WA  . loratadine  10 mg Oral  Daily  . mupirocin ointment  1 application Nasal BID  . pravastatin  20 mg Oral Daily  . senna  1 tablet Oral BID  . sodium chloride flush  3 mL Intravenous Q12H  . tamsulosin  0.4 mg Oral Daily  . warfarin  2.5 mg Oral ONCE-1800  . Warfarin - Pharmacist Dosing Inpatient   Does not apply q1800   Continuous Infusions: . sodium chloride    . ceFEPime (MAXIPIME) IV Stopped (08/28/17 1014)  . vancomycin Stopped (08/28/17  1235)     LOS: 4 days       Georgette Shell, MD [facility]  If 7PM-7AM, please contact night-coverage [email or web address] Password [facility] 08/28/2017, 1:06 PM

## 2017-08-28 NOTE — Progress Notes (Signed)
[place] for Warfarin Indication: Hx DVT/PE, afib  Allergies  Allergen Reactions  . Fish Allergy     Sweaty, upset stomach  . Penicillins     Blackouts  Has patient had a PCN reaction causing immediate rash, facial/tongue/throat swelling, SOB or lightheadedness with hypotension: Yes Has patient had a PCN reaction causing severe rash involving mucus membranes or skin necrosis: No Has patient had a PCN reaction that required hospitalization: No Has patient had a PCN reaction occurring within the last 10 years: No If all of the above answers are "NO", then may proceed with Cephalosporin use.     Patient Measurements:   Vital Signs: Temp: 98.4 F (36.9 C) (01/21 2206) Temp Source: Oral (01/21 2206) BP: 114/58 (01/21 2206) Pulse Rate: 100 (01/21 2206)  Labs: Recent Labs    08/26/17 0625 08/27/17 0607 08/28/17 0415  HGB 7.6* 8.6* 7.8*  HCT 24.4* 27.8* 24.9*  PLT 279 242 212  LABPROT 25.2* 27.7* 25.1*  INR 2.31 2.61 2.30  CREATININE 0.54* 0.57* 0.56*    Estimated Creatinine Clearance: 90.6 mL/min (A) (by C-G formula based on SCr of 0.56 mg/dL (L)).   Medications:  Scheduled:  . Chlorhexidine Gluconate Cloth  6 each Topical Q0600  . feeding supplement (GLUCERNA SHAKE)  237 mL Oral TID BM  . guaiFENesin  600 mg Oral BID  . ipratropium-albuterol  3 mL Nebulization Q6H WA  . loratadine  10 mg Oral Daily  . mupirocin ointment  1 application Nasal BID  . pravastatin  20 mg Oral Daily  . senna  1 tablet Oral BID  . sodium chloride flush  3 mL Intravenous Q12H  . tamsulosin  0.4 mg Oral Daily  . Warfarin - Pharmacist Dosing Inpatient   Does not apply q1800   Infusions:  . sodium chloride    . ceFEPime (MAXIPIME) IV 1 g (08/28/17 0255)  . vancomycin 1,000 mg (08/27/17 2350)   PRN:   Assessment: 72 yo male on chronic warfarin for hx afib and DVT/PE and Afib.  Home dose reported as 2.5mg  PO daily.  INR therapeutic on admission.   Pharmacy consulted to continue dosing on admission 1/18  Today, 08/28/17  INR continues to be therapeutic   CBC: hgb low- decreased to previous levels s/p PRBC 1/20, pltc remain WNL but noted to be trending down  AST/ALT noted to be elevated 1/21 (previously WNL) - ? Related to chemotherapy.    On APAP - last doses 1/20 and percocet, also on pravastatin  No reported bleeding despite anemia  Goal of Therapy:  INR 2-3 Monitor platelets by anticoagulation protocol: Yes   Plan:   Warfarin 2.5 mg PO x 1 tonight   Daily INR  CBC at least q72 hr while on warfarin inpatient  Suggest repeat LFTs and adjust possibly adjust meds that can increase liver enzymes   Hold statin as appropriate  Change percocet to plain oxycodone IR  Possibly hold PRN APAP  Doreene Eland, PharmD, BCPS.   Pager: [phone] 08/28/2017 7:52 AM

## 2017-08-28 NOTE — Progress Notes (Signed)
Subjective: The patient seen and examined today.  His sister was at the bedside.  This is a very pleasant 72 years old white male with a stage IV non-small cell lung cancer, squamous cell carcinoma presented with large right lower lobe lung mass associated with postobstructive pneumonitis as well as left hilar and subcarinal lymphadenopathy and left pleural effusion.  He status post short course of palliative radiotherapy to the left lung mass and he is currently undergoing systemic chemotherapy with carboplatin and paclitaxel status post 2 cycles.  The last cycle was given on August 20, 2017.  The patient was admitted to [facility] on 08/24/2017 with questionable pneumonia of the left lung.  CT angiogram of the chest showed no evidence for pulmonary embolism.  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  There was also increase in the postobstructive pneumonia throughout the left lung.  The patient is currently undergoing treatment with cefepime and vancomycin.  He is feeling a little bit better but continues to have significant shortness of breath with minimal exertion.  Objective: Vital signs in last 24 hours: Temp:  [98.4 F (36.9 C)] 98.4 F (36.9 C) (01/21 2206) Pulse Rate:  [100] 100 (01/21 2206) Resp:  [18] 18 (01/21 2206) BP: (114)/(58) 114/58 (01/21 2206) SpO2:  [98 %] 98 % (01/21 2206)  Intake/Output from previous day: 01/21 0701 - 01/22 0700 In: 60 [P.O.:60] Out: -  Intake/Output this shift: No intake/output data recorded.  General appearance: alert, cooperative, fatigued and mild distress Resp: diminished breath sounds LLL and dullness to percussion LLL Cardio: regular rate and rhythm, S1, S2 normal, no murmur, click, rub or gallop GI: soft, non-tender; bowel sounds normal; no masses,  no  organomegaly Extremities: extremities normal, atraumatic, no cyanosis or edema  Lab Results:  Recent Labs    08/27/17 0607 08/28/17 0415  WBC 2.7* 1.9*  HGB 8.6* 7.8*  HCT 27.8* 24.9*  PLT 242 212   BMET Recent Labs    08/27/17 0607 08/28/17 0415  NA 136 134*  K 3.5 3.4*  CL 98* 97*  CO2 32 31  GLUCOSE 106* 115*  BUN 7 8  CREATININE 0.57* 0.56*  CALCIUM 8.6* 8.3*    Studies/Results: Dg Chest 1 View  Result Date: 08/28/2017 CLINICAL DATA:  Shortness of breath. EXAM: CHEST 1 VIEW COMPARISON:  08/26/2017.  CT 08/24/2017. FINDINGS: Mediastinum hilar structures stable. Stable cardiomegaly. Persistent consolidation in the left mid and lower lung with persistent left-sided pleural effusion. Cavitary process in the left lung base best identified by prior CT. Persistent mild right lung interstitial prominence. No pneumothorax identified. IMPRESSION: 1. Persistent dense consolidation of the left mid and lower lung with persistent left-sided pleural effusion. Cavitary process in the left lung base best identified by prior CT. 2. Stable cardiomegaly. Persistent mild increase interstitial markings noted within the right lung. Component mild interstitial edema may be present. Electronically Signed   By: Marcello Moores  Register   On: 08/28/2017 16:00    Medications: I have reviewed the patient's current medications.  CODE STATUS: No CODE BLUE  Assessment/Plan: This is a very pleasant 72 years old white male with a stage IV non-small cell lung cancer currently undergoing systemic chemotherapy with carboplatin and paclitaxel with mild improvement of the left lower lobe lung mass and left hilar adenopathy.  Unfortunately the patient continues to have significant postobstructive pneumonia  and now development of new bronchopleural fistula. I had a lengthy discussion with the patient and his sister today about his condition and treatment options.  His performance status is declining. I strongly  encouraged the patient to consider palliative care and hospice referral at this point.  He will be at high risk for future treatment with systemic chemotherapy especially with the new development of bronchopleural fistula and persistent postobstructive pneumonia. The patient is in agreement with the current plan. For the chemotherapy-induced anemia, he may benefit from 1 unit of PRBCs transfusion before discharge. For the chemotherapy-induced neutropenia, we will continue to monitor for now and consider the patient for treatment with Granix if his absolute neutrophil count is less than 500. For the postobstructive pneumonia, continue his current treatment with cefepime and vancomycin. Thank you so much for taking good care of Mr. William Stanley, I would continue to follow-up the patient with you and assist in his management on as-needed basis.    LOS: 4 days    Eilleen Kempf 08/28/2017

## 2017-08-28 NOTE — Progress Notes (Signed)
Pharmacy Antibiotic Note  William Stanley is a 72 y.o. male with lung cancer on chemotherapy, most recent treatment on Monday, admitted on 08/23/2017 with postobstructive PNA  Pharmacy has been consulted for vancomycin and cefepime dosing.  Today, 08/28/2017:  Day #4 antibiotics  Renal: SCr stable  WBC trending down, ANC = 1000  Vancomycin levels today reveal a actual peak = 28 and actual trough = 17.1 for AUC = 531 (Goal 400-550)  Cultures unrevealing  Plan:  Continue Vancomycin 1g IV q12h (AUC within goal) -   Follow-up intended length of vancomycin therapy - consider stopping based on culture data  Continue Cefepime 1g IV q8h  Follow up renal function & cultures    Temp (24hrs), Avg:98.1 F (36.7 C), Min:97.7 F (36.5 C), Max:98.4 F (36.9 C)  Recent Labs  Lab 08/23/17 2021 08/23/17 2037 08/23/17 2240 08/25/17 0656 08/25/17 1217 08/26/17 0625 08/27/17 0607 08/28/17 0415 08/28/17 1041  WBC 13.6*  --   --  4.0 3.9* 3.3* 2.7* 1.9*  --   CREATININE 0.67  --   --  0.56*  --  0.54* 0.57* 0.56*  --   LATICACIDVEN  --  1.67 1.45  --   --   --   --   --   --   VANCOTROUGH  --   --   --   --   --   --   --   --  18  VANCOPEAK  --   --   --   --   --   --   --  24*  --     Estimated Creatinine Clearance: 90.6 mL/min (A) (by C-G formula based on SCr of 0.56 mg/dL (L)).    Allergies  Allergen Reactions  . Fish Allergy     Sweaty, upset stomach  . Penicillins     Blackouts  Has patient had a PCN reaction causing immediate rash, facial/tongue/throat swelling, SOB or lightheadedness with hypotension: Yes Has patient had a PCN reaction causing severe rash involving mucus membranes or skin necrosis: No Has patient had a PCN reaction that required hospitalization: No Has patient had a PCN reaction occurring within the last 10 years: No If all of the above answers are "NO", then may proceed with Cephalosporin use.     Antimicrobials this admission:  1/17 Aztreonam x  1 1/17 Levaquin x 1 1/17 Vancomycin >> 1/18 Cefepime >>  Dose adjustments this admission:   Microbiology results:  1/17 BCx: ngtd 1/18 UCx: mx spp 1/18 Sputum: normal flora 1/18 MRSA PCR: positive  Thank you for allowing pharmacy to be a part of this patient's care.  Doreene Eland, PharmD, BCPS.   Pager: [phone] 08/28/2017 12:30 PM

## 2017-08-29 ENCOUNTER — Other Ambulatory Visit: Payer: Self-pay

## 2017-08-29 ENCOUNTER — Ambulatory Visit: Payer: Medicare HMO

## 2017-08-29 ENCOUNTER — Telehealth: Payer: Self-pay | Admitting: Medical Oncology

## 2017-08-29 LAB — CBC WITH DIFFERENTIAL/PLATELET
BASOS PCT: 1 %
Basophils Absolute: 0 10*3/uL (ref 0.0–0.1)
EOS PCT: 6 %
Eosinophils Absolute: 0.1 10*3/uL (ref 0.0–0.7)
HEMATOCRIT: 27.4 % — AB (ref 39.0–52.0)
Hemoglobin: 8.4 g/dL — ABNORMAL LOW (ref 13.0–17.0)
LYMPHS ABS: 0.7 10*3/uL (ref 0.7–4.0)
Lymphocytes Relative: 38 %
MCH: 27.7 pg (ref 26.0–34.0)
MCHC: 30.7 g/dL (ref 30.0–36.0)
MCV: 90.4 fL (ref 78.0–100.0)
Monocytes Absolute: 0.6 10*3/uL (ref 0.1–1.0)
Monocytes Relative: 40 %
Neutro Abs: 0.2 10*3/uL — ABNORMAL LOW (ref 1.7–7.7)
Neutrophils Relative %: 15 %
PLATELETS: 177 10*3/uL (ref 150–400)
RBC: 3.03 MIL/uL — ABNORMAL LOW (ref 4.22–5.81)
RDW: 17.7 % — AB (ref 11.5–15.5)
WBC: 1.6 10*3/uL — ABNORMAL LOW (ref 4.0–10.5)

## 2017-08-29 LAB — BASIC METABOLIC PANEL
Anion gap: 9 (ref 5–15)
BUN: 7 mg/dL (ref 6–20)
CO2: 31 mmol/L (ref 22–32)
Calcium: 8.6 mg/dL — ABNORMAL LOW (ref 8.9–10.3)
Chloride: 95 mmol/L — ABNORMAL LOW (ref 101–111)
Creatinine, Ser: 0.54 mg/dL — ABNORMAL LOW (ref 0.61–1.24)
GFR calc Af Amer: >60 mL/min (ref >60)
GLUCOSE: 97 mg/dL (ref 65–99)
POTASSIUM: 3.6 mmol/L (ref 3.5–5.1)
Sodium: 135 mmol/L (ref 135–145)

## 2017-08-29 LAB — PHOSPHORUS: Phosphorus: 3.2 mg/dL (ref 2.5–4.6)

## 2017-08-29 LAB — PROTIME-INR
INR: 1.97
PROTHROMBIN TIME: 22.3 s — AB (ref 11.4–15.2)

## 2017-08-29 LAB — MAGNESIUM: Magnesium: 1.8 mg/dL (ref 1.7–2.4)

## 2017-08-29 MED ORDER — GLYCOPYRROLATE 0.2 MG/ML IJ SOLN
0.2000 mg | INTRAMUSCULAR | Status: DC | PRN
  Filled 2017-08-29: qty 1

## 2017-08-29 MED ORDER — HALOPERIDOL LACTATE 5 MG/ML IJ SOLN: 0.5000 mg | INTRAMUSCULAR | Status: DC | PRN

## 2017-08-29 MED ORDER — IPRATROPIUM-ALBUTEROL 0.5-2.5 (3) MG/3ML IN SOLN
3.0000 mL | Freq: Three times a day (TID) | RESPIRATORY_TRACT | Status: DC
  Administered 2017-08-30 (×3): 3 mL via RESPIRATORY_TRACT
  Filled 2017-08-29 (×5): qty 3

## 2017-08-29 MED ORDER — HALOPERIDOL LACTATE 2 MG/ML PO CONC
0.5000 mg | ORAL | Status: DC | PRN
  Filled 2017-08-29: qty 0.3

## 2017-08-29 MED ORDER — OXYCODONE HCL 5 MG PO TABS
5.0000 mg | ORAL_TABLET | Freq: Four times a day (QID) | ORAL | Status: DC | PRN
  Administered 2017-08-29 – 2017-08-30 (×4): 5 mg via ORAL
  Filled 2017-08-29 (×4): qty 1

## 2017-08-29 MED ORDER — MORPHINE SULFATE (CONCENTRATE) 10 MG/0.5ML PO SOLN: 5.0000 mg | ORAL | Status: DC | PRN

## 2017-08-29 MED ORDER — MORPHINE SULFATE (CONCENTRATE) 10 MG/0.5ML PO SOLN
5.0000 mg | ORAL | Status: DC | PRN
  Administered 2017-08-31: 5 mg via ORAL
  Filled 2017-08-29: qty 0.5

## 2017-08-29 MED ORDER — LORAZEPAM 2 MG/ML PO CONC: 1.0000 mg | ORAL | Status: DC | PRN

## 2017-08-29 MED ORDER — GLYCOPYRROLATE 0.2 MG/ML IJ SOLN
0.2000 mg | INTRAMUSCULAR | Status: DC | PRN
  Administered 2017-08-29: 0.2 mg via SUBCUTANEOUS
  Filled 2017-08-29: qty 1

## 2017-08-29 MED ORDER — POLYVINYL ALCOHOL 1.4 % OP SOLN
1.0000 [drp] | Freq: Four times a day (QID) | OPHTHALMIC | Status: DC | PRN
  Filled 2017-08-29: qty 15

## 2017-08-29 MED ORDER — GLYCOPYRROLATE 1 MG PO TABS
1.0000 mg | ORAL_TABLET | ORAL | Status: DC | PRN
  Filled 2017-08-29: qty 1

## 2017-08-29 MED ORDER — HALOPERIDOL 0.5 MG PO TABS
0.5000 mg | ORAL_TABLET | ORAL | Status: DC | PRN
  Filled 2017-08-29: qty 1

## 2017-08-29 MED ORDER — WARFARIN SODIUM 3 MG PO TABS
3.0000 mg | ORAL_TABLET | Freq: Once | ORAL | Status: AC
Start: 2017-08-29 — End: 2017-08-29
  Administered 2017-08-29: 3 mg via ORAL
  Filled 2017-08-29: qty 1

## 2017-08-29 MED ORDER — LORAZEPAM 2 MG/ML IJ SOLN: 1.0000 mg | INTRAMUSCULAR | Status: DC | PRN

## 2017-08-29 MED ORDER — LORAZEPAM 1 MG PO TABS: 1.0000 mg | ORAL_TABLET | ORAL | Status: DC | PRN

## 2017-08-29 MED ORDER — BIOTENE DRY MOUTH MT LIQD: 15.0000 mL | OROMUCOSAL | Status: DC | PRN

## 2017-08-29 MED ORDER — SALINE SPRAY 0.65 % NA SOLN
1.0000 | NASAL | Status: DC | PRN
Start: 2017-08-29 — End: 2017-08-31
  Filled 2017-08-29: qty 44

## 2017-08-29 NOTE — Telephone Encounter (Signed)
All what they need to know is in my note from yesterday.

## 2017-08-29 NOTE — Telephone Encounter (Signed)
Palliative Medicine met with pt. He is considering Hospice bur wants to know if Julien Nordmann has any chemo or xrt options. She would appreciate Mohamed's input.

## 2017-08-29 NOTE — Progress Notes (Addendum)
Daily Progress Note   Patient Name: William Stanley       Date: 08/29/2017 DOB: Jan 31, 1946  Age: 72 y.o. MRN#: [id number] Attending Physician: Georgette Shell, MD Primary Care Physician: Rennis Golden Admit Date: 08/23/2017  Reason for Consultation/Follow-up: Establishing goals of care  Subjective: Talked at length with family and patient.  He has decided to go to [facility]).  However he wants to speak with his wife about it in the morning before giving me the final "ok".  We talked at length about his property and things in his life that he needs to tidy up.  He is staying up at night sorting thru his affairs in his mind.   Assessment:  Able to stand with rolling walker and assistance.  Able to eat sips and bites.  Recurrent pna from obstructive tumor and broncho-pleural fistula will continue and will take his life.  Becomes very dyspneic even at rest.   Patient Profile/HPI:  72 y.o. male  with past medical history of stage 4 non small cell lung cancer on chemotherapy, afib, PE/DVT on coumadin, DVT, who was admitted on 08/23/2017 with post obstructive pneumonia and UTI with sepsis.  He also has a broncho-pulmonary fistula.   Length of Stay: 5  Current Medications: Scheduled Meds:  . Chlorhexidine Gluconate Cloth  6 each Topical Q0600  . feeding supplement (GLUCERNA SHAKE)  237 mL Oral TID BM  . guaiFENesin  600 mg Oral BID  . ipratropium-albuterol  3 mL Nebulization TID  . loratadine  10 mg Oral Daily  . mupirocin ointment  1 application Nasal BID  . senna  1 tablet Oral BID  . sodium chloride flush  3 mL Intravenous Q12H  . tamsulosin  0.4 mg Oral Daily  . warfarin  3 mg Oral ONCE-1800  . Warfarin - Pharmacist Dosing Inpatient   Does not apply q1800      Continuous Infusions: . sodium chloride    . ceFEPime (MAXIPIME) IV Stopped (08/29/17 0826)    PRN Meds: sodium chloride, albuterol, bismuth subsalicylate, docusate sodium, HYDROcodone-homatropine, ondansetron **OR** ondansetron (ZOFRAN) IV, oxyCODONE, polyethylene glycol, sodium chloride, sodium chloride flush, traZODone  Physical Exam        Well developed male, speaking softly, talkative,  Thoughts seem to be cycling thru his head.  He is coherent, NAD  Vital Signs: BP 108/70 (BP Location: Right Arm)   Pulse 95   Temp 98.2 F (36.8 C) (Oral)   Resp 16   SpO2 98%  SpO2: SpO2: 98 % O2 Device: O2 Device: Nasal Cannula O2 Flow Rate: O2 Flow Rate (L/min): 2 L/min  Intake/output summary:   Intake/Output Summary (Last 24 hours) at 08/29/2017 1528 Last data filed at 08/29/2017 1422 Gross per 24 hour  Intake 600 ml  Output 800 ml  Net -200 ml   LBM: Last BM Date: 08/23/17 Baseline Weight:   Most recent weight:         Palliative Assessment/Data: 40%    Flowsheet Rows     Most Recent Value  Intake Tab  Referral Department  Hospitalist  Unit at Time of Referral  Med/Surg Unit  Palliative Care Primary Diagnosis  Cancer  Date Notified  08/27/17  Palliative Care Type  New Palliative care  Reason for referral  Non-pain Symptom, Clarify Goals of Care  Date of Admission  08/23/17  Date first seen by Palliative Care  08/28/17  # of days Palliative referral response time  1 Day(s)  # of days IP prior to Palliative referral  4  Clinical Assessment  Psychosocial & Spiritual Assessment  Palliative Care Outcomes      Patient Active Problem List   Diagnosis Date Noted  . Palliative care encounter   . Goals of care, counseling/discussion   . Bronchopleural fistula ([place]) 08/25/2017  . Sepsis ([place]) 08/24/2017  . PNA (pneumonia) 08/24/2017  . UTI (urinary tract infection) 08/24/2017  . Protein-calorie malnutrition ([place]) 08/20/2017  . Encounter for antineoplastic  chemotherapy 07/20/2017  . Stage IV squamous cell carcinoma of left lung ([place]) 07/03/2017  . Lung mass 05/25/2017  . Diabetes mellitus type 2, uncomplicated ([place]) 08/15/3233  . Elevated PSA 10/23/2016  . Encounter for therapeutic drug monitoring 09/01/2013  . Bruit 06/16/2013  . Venous stasis ulcer ([place]) 01/14/2013  . Hypertension 12/12/2011  . Hyperlipidemia 12/12/2011  . Tobacco abuse 12/12/2011  . Warfarin anticoagulation 01/05/2011  . Atrial fibrillation ([place]) 10/24/2010    Palliative Care Plan    Recommendations/Plan:  Begin shift to full comfort.  Patient wants to continue breathing treatments and pills.  Will consult SW and ask them to see him tomorrow morning about hospice house  Orders adjusted for comfort.  Thank you to Dr. Julien Nordmann for his visit and recommendations 1/22  Goals of Care and Additional Recommendations:  Limitations on Scope of Treatment: Full Comfort Care  Code Status:  DNR  Prognosis:  At high risk to decline suddenly from a respiratory event, barring that prognosis is days to weeks given metastatic non small cell lung cancer, with post obstructive PNA, and bronchopleural fistula.  Discharge Planning:  Hospice facility  Care plan was discussed with patient, patient's sister, brother, sister in law.  Thank you for allowing the Palliative Medicine Team to assist in the care of this patient.  Total time spent:  35 min.     Greater than 50%  of this time was spent counseling and coordinating care related to the above assessment and plan.  Florentina Jenny, PA-C Palliative Medicine  Please contact Palliative MedicineTeam phone at [phone] for questions and concerns between 7 am - 7 pm.   Please see AMION for individual provider pager numbers.

## 2017-08-29 NOTE — Evaluation (Signed)
Physical Therapy Evaluation Patient Details Name: William Stanley MRN: [id number] DOB: 01/17/1946 Today's Date: 08/29/2017   History of Present Illness   72 y.o. male with h/o Lung Ca on ChemoRx, h/o DVT/PE, A. fib on chronic anticoagulation (Coumadin),  who presented with complaints of back and leg pain.     Sepsis is presumed to be secondary to combination of pneumonia and possible UTI, Ultimately patient had CT scan of the abdomen which showed a postobstructive pneumonia with bronchopulmonary fistula.     Clinical Impression  Pt admitted with above diagnosis. Pt currently with functional limitations due to the deficits listed below (see PT Problem List).  Pt will benefit from skilled PT to increase their independence and safety with mobility to allow discharge to the venue listed below.  Will follow in acute setting   Follow Up Recommendations No PT follow up(no PT if BP or Hospice, if not HHPT)    Equipment Recommendations  Rolling walker with 5" wheels;Wheelchair (measurements PT);3in1 (PT)    Recommendations for Other Services       Precautions / Restrictions Precautions Precautions: Fall Restrictions Weight Bearing Restrictions: No      Mobility  Bed Mobility Overal bed mobility: Needs Assistance Bed Mobility: Supine to Sit     Supine to sit: Min guard;HOB elevated     General bed mobility comments: incr time needed, HOB elevated, pt sleeps in recliner  Transfers Overall transfer level: Needs assistance Equipment used: Rolling walker (2 wheeled) Transfers: Sit to/from Stand Sit to Stand: Min guard;Min assist         General transfer comment: cues for hand placement, assist for balance to transition to walker  Ambulation/Gait Ambulation/Gait assistance: Min assist;Min guard Ambulation Distance (Feet): 12 Feet Assistive device: Rolling walker (2 wheeled) Gait Pattern/deviations: Step-through pattern;Decreased stride length;Trunk flexed     General Gait  Details: distance limited d/t pt DOE and incr HR (max 130), cues for breathing, awareness of fatigue level, RW position  Stairs            Wheelchair Mobility    Modified Rankin (Stroke Patients Only)       Balance Overall balance assessment: Needs assistance(denies falls)   Sitting balance-Leahy Scale: Fair       Standing balance-Leahy Scale: Poor Standing balance comment: heavily reliant on UEs for balance                             Pertinent Vitals/Pain Pain Assessment: 0-10 Pain Score: 3  Pain Location: RIGHT SIDE Pain Descriptors / Indicators: Sore Pain Intervention(s): Monitored during session    Home Living Family/patient expects to be discharged to:: Private residence Living Arrangements: Alone Available Help at Discharge: Friend(s) Type of Home: Mobile home Home Access: Stairs to enter Entrance Stairs-Rails: Psychiatric nurse of Steps: 7 Home Layout: One level Home Equipment: Environmental consultant - standard Additional Comments: friend helps with meals after she gets off work    Prior Function Level of Independence: Independent with assistive device(s)         Comments: amb with standard walker     Hand Dominance        Extremity/Trunk Assessment   Upper Extremity Assessment Upper Extremity Assessment: Generalized weakness    Lower Extremity Assessment Lower Extremity Assessment: Generalized weakness    Cervical / Trunk Assessment Cervical / Trunk Assessment: Kyphotic  Communication   Communication: No difficulties  Cognition Arousal/Alertness: Awake/alert Behavior During Therapy: WFL for tasks assessed/performed  Overall Cognitive Status: Within Functional Limits for tasks assessed                                        General Comments      Exercises     Assessment/Plan    PT Assessment Patient needs continued PT services  PT Problem List Decreased strength;Decreased activity  tolerance;Decreased balance;Decreased knowledge of use of DME       PT Treatment Interventions DME instruction;Gait training;Functional mobility training;Therapeutic activities;Therapeutic exercise;Patient/family education;Balance training    PT Goals (Current goals can be found in the Care Plan section)  Acute Rehab PT Goals Patient Stated Goal: home if able PT Goal Formulation: With patient Potential to Achieve Goals: Good    Frequency Min 3X/week   Barriers to discharge Decreased caregiver support      Co-evaluation               AM-PAC PT "6 Clicks" Daily Activity  Outcome Measure Difficulty turning over in bed (including adjusting bedclothes, sheets and blankets)?: Unable Difficulty moving from lying on back to sitting on the side of the bed? : Unable Difficulty sitting down on and standing up from a chair with arms (e.g., wheelchair, bedside commode, etc,.)?: Unable Help needed moving to and from a bed to chair (including a wheelchair)?: A Little Help needed walking in hospital room?: A Little Help needed climbing 3-5 steps with a railing? : A Lot 6 Click Score: 11    End of Session Equipment Utilized During Treatment: Gait belt Activity Tolerance: Patient tolerated treatment well Patient left: with call bell/phone within reach;in chair;with family/visitor present   PT Visit Diagnosis: Difficulty in walking, not elsewhere classified (R26.2)    Time: 1448-1856 PT Time Calculation (min) (ACUTE ONLY): 23 min   Charges:   PT Evaluation $PT Eval Low Complexity: 1 Low PT Treatments $Gait Training: 8-22 mins   PT G Codes:          Garold Sheeler 08-30-17, 11:57 AM

## 2017-08-29 NOTE — Progress Notes (Signed)
PROGRESS NOTE    William Stanley  KGY:[id number] DOB: 1946-04-25 DOA: 08/23/2017 PCP: [name], PA-C   Brief Narrative: 72 y.o.malewith h/o Lung Ca on ChemoRx, h/oDVT/PE, A. fib on chronic anticoagulation(Coumadin),who presented with complaints of back and leg pain.Patient is a poor historian, history obtained from patient's sister at bedside, ,no vomiting no diarrhea, no frank chest pains,patient had tachycardia, leukocytosis and a fever of 100.8 on admission, patient had increased oxygen requirement, increased work of breathing, patient met sepsis criteria on admission,lactic acid was not elevated.Sepsis is presumed to be secondary to combination of pneumonia and possible UTI,Ultimately patient had CT scan of the abdomen which showed a postobstructive pneumonia with bronchopulmonary fistula.  In ED.Marland KitchenMarland KitchenMarland KitchenPatient receivedvancomycin,Levaquin and azactamdue to concerns about penicillin allergy  1/21-patient ompliants of sob,cough.hissister is in the room with him.  1/22-patient complains of ongoing cough.  1/23 he feels his cough is better.  He would prefer going home by the end of the week with hospice or going to [facility] if needed by the end of the week.  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  Vancomycin was stopped at the [place] was stopped.  CT scan of the chest shows new postobstructive pneumonia throughout the left lung. With left hilar adenopathy. And mild  mediastinal adenopathy. CT scan of the chest showsNo evidence of pulmonary embolism on this limited motion degraded scan. 2. Cavitary left lower lobe lung mass is decreased in size. 3. New directly visualized BRONCHO PLEURAL FISTULA  in the left lower lobe frankly communicating with loculated malignant basilar left hydropneumothorax. 4. New/increased postobstructive pneumonia throughout the left lung. 5. Left hilar adenopathy is decreased. 6. Mediastinal adenopathy is mildly increased, cannot exclude progressive metastatic mediastinal adenopathy. 7. Left main and 3 vessel coronary atherosclerosis 2]history of PE DVT on Coumadin  3]history of A. fib on Coumadin  4]type 2 diabetes takes metformin at home which is on hold.  5]hypertension he does not take any medications at home will place him on IV Lopressor as needed.  6history of lung cancer status post recent chemotherapy 5 days ago.  Followed by P CCM.   7. Left main and 3 vessel coronary atherosclerosis  -Patient started on vanc.  P CCM recommends no further invasive treatment at this time.  Unless patient has pleural effusion that is symptomatic and  palliative thoracentesis could be considered.  2]history of PE DVT on Coumadin  3]history of A. fib on Coumadin  4]type 2 diabetes takes metformin at home which is on hold.  5]hypertension he does not take any medications at home will place him on IV Lopressor as needed.  6history of lung cancer status post recent chemotherapy 5 days ago.  Followed by P CCM.  7]anemia patient has a history of anemia of chronic disease.Status post blood transfusion his hemoglobin increased to 8.6 from 7.6 after transfusion.   8]hypomagnesemia replete        DVT prophylaxis: Coumadin Code Status: DO NOT RESUSCITATE Family Communication discussed with sister Disposition Plan: Plan DC home  with hospice by the end of the week or DC to [facility] if his  condition worsens by weekend.   Consultants:   Palliative care, oncology, P CCM  Procedures: None Antimicrobials: Cefepime Subjective: Feels a little better   Objective: Vitals:   08/27/17 2206 08/28/17 2032 08/28/17 2100 08/29/17 0333  BP: (!) 114/58 117/61  (!) 115/58  Pulse: 100 91  (!) 109  Resp: 18 20 (!) 22 20  Temp: 98.4 F (36.9 C) 98.7 F (37.1 C)  98.1 F (36.7 C)  TempSrc: Oral Oral  Oral  SpO2: 98% 99%      Intake/Output Summary (Last 24 hours) at 08/29/2017 0800 Last data filed at 08/29/2017 0334 Gross per 24 hour  Intake 120 ml  Output 600 ml  Net -480 ml   There were no vitals filed for this visit.  Examination:  General exam: Appears calm and comfortable  Respiratory system: Coarser to auscultation. Respiratory effort normal. Cardiovascular system: S1 & S2 heard, RRR. No JVD, murmurs, rubs, gallops or clicks. No pedal edema. Gastrointestinal system: Abdomen is nondistended, soft and nontender. No organomegaly or masses felt. Normal bowel sounds heard. Central nervous system: Alert and oriented. No focal neurological deficits. Extremities: Symmetric 5 x 5 power. Skin: No rashes, lesions or ulcers Psychiatry: Judgement and insight appear normal. Mood & affect appropriate.     Data Reviewed: I have personally reviewed following labs and imaging studies  CBC: Recent Labs  Lab 08/23/17 2021 08/25/17 0656 08/25/17 1217 08/26/17 0625 08/27/17 0607 08/28/17 0415  WBC 13.6* 4.0 3.9* 3.3* 2.7* 1.9*  NEUTROABS 13.0*  --   --  2.7 2.0 1.0*  HGB 9.4* 7.4* 7.1* 7.6* 8.6* 7.8*  HCT 30.3* 24.0* 23.4* 24.4* 27.8* 24.9*  MCV 89.6 90.6 91.1 89.4 90.3 89.6  PLT 496* 362 334 279 242 224   Basic Metabolic Panel: Recent Labs  Lab 08/23/17 2021 08/25/17 0656 08/26/17 0625 08/27/17 0607 08/28/17 0415  NA 134* 135 134* 136 134*  K 3.6 3.2* 3.5 3.5 3.4*  CL 97* 100* 99* 98* 97*  CO2 '27 28 30 '$ 32 31  GLUCOSE 109* 108* 106* 106* 115*  BUN '15 10 6 7 8    '$ CREATININE 0.67 0.56* 0.54* 0.57* 0.56*  CALCIUM 8.8* 8.2* 8.3* 8.6* 8.3*  MG  --   --  1.6* 2.0 1.8  PHOS  --   --  3.1 3.0 2.8   GFR: Estimated Creatinine Clearance: 90.6 mL/min (A) (by C-G formula based on SCr of 0.56 mg/dL (L)). Liver Function Tests: Recent Labs  Lab 08/23/17 2021 08/27/17 0607  AST 29 301*  ALT 20 136*  ALKPHOS 68 123  BILITOT 0.9 0.9  PROT 7.3 6.2*  ALBUMIN 2.3* 1.8*   No results for input(s): LIPASE, AMYLASE in the last 168 hours. No results for input(s): AMMONIA in the last 168 hours. Coagulation Profile: Recent Labs  Lab 08/23/17 2040 08/25/17 0656 08/26/17 0625 08/27/17 0607 08/28/17 0415  INR 2.40 2.10 2.31 2.61 2.30   Cardiac Enzymes: No results for input(s): CKTOTAL, CKMB, CKMBINDEX, TROPONINI in the last 168 hours. BNP (last 3 results) No results for input(s): PROBNP in the last 8760 hours. HbA1C: No results for input(s): HGBA1C in the last 72 hours. CBG: Recent Labs  Lab 08/24/17 1731  GLUCAP 104*   Lipid Profile: No results for input(s): CHOL, HDL, LDLCALC, TRIG, CHOLHDL, LDLDIRECT in the last 72 hours. Thyroid Function Tests: No results for input(s): TSH, T4TOTAL, FREET4, T3FREE, THYROIDAB in the last  72 hours. Anemia Panel: No results for input(s): VITAMINB12, FOLATE, FERRITIN, TIBC, IRON, RETICCTPCT in the last 72 hours. Sepsis Labs: Recent Labs  Lab 08/23/17 2037 08/23/17 2240  LATICACIDVEN 1.67 1.45    Recent Results (from the past 240 hour(s))  Blood Culture (routine x 2)     Status: None   Collection Time: 08/23/17  8:21 PM  Result Value Ref Range Status   Specimen Description BLOOD LEFT FOREARM  Final   Special Requests   Final    BOTTLES DRAWN AEROBIC AND ANAEROBIC Blood Culture adequate volume   Culture   Final    NO GROWTH 5 DAYS Performed at [facility], [address], [place], [place] [address]    Report Status 08/28/2017 FINAL  Final  Blood Culture (routine x 2)     Status: None   Collection  Time: 08/23/17  8:21 PM  Result Value Ref Range Status   Specimen Description BLOOD RIGHT ANTECUBITAL  Final   Special Requests   Final    BOTTLES DRAWN AEROBIC AND ANAEROBIC Blood Culture adequate volume   Culture   Final    NO GROWTH 5 DAYS Performed at [facility], [place] [address], [place], [place] [address]    Report Status 08/28/2017 FINAL  Final  Urine culture     Status: Abnormal   Collection Time: 08/24/17  7:27 AM  Result Value Ref Range Status   Specimen Description URINE, CATHETERIZED  Final   Special Requests NONE  Final   Culture MULTIPLE SPECIES PRESENT, SUGGEST RECOLLECTION (A)  Final   Report Status 08/25/2017 FINAL  Final  Culture, expectorated sputum-assessment     Status: None   Collection Time: 08/24/17 12:15 PM  Result Value Ref Range Status   Specimen Description EXPECTORATED SPUTUM  Final   Special Requests NONE  Final   Sputum evaluation THIS SPECIMEN IS ACCEPTABLE FOR SPUTUM CULTURE  Final   Report Status 08/24/2017 FINAL  Final  Culture, respiratory (NON-Expectorated)     Status: None   Collection Time: 08/24/17 12:15 PM  Result Value Ref Range Status   Specimen Description EXPECTORATED SPUTUM  Final   Special Requests NONE Reflexed from [id number]  Final   Gram Stain   Final    FEW WBC PRESENT, PREDOMINANTLY PMN ABUNDANT GRAM POSITIVE COCCI IN CLUSTERS MODERATE GRAM NEGATIVE RODS FEW GRAM POSITIVE RODS    Culture   Final    Consistent with normal respiratory flora. Performed at [facility], [place] [address] [place], [place] [address]    Report Status 08/27/2017 FINAL  Final  MRSA PCR Screening     Status: Abnormal   Collection Time: 08/24/17  2:10 PM  Result Value Ref Range Status   MRSA by PCR POSITIVE (A) NEGATIVE Final    Comment:        The GeneXpert MRSA Assay (FDA approved for NASAL specimens only), is one component of a comprehensive MRSA colonization surveillance program. It is not intended to diagnose MRSA infection nor to  guide or monitor treatment for MRSA infections. RESULT CALLED TO, READ BACK BY AND VERIFIED WITH: FRASER,B @ 1803 ON [id number] BY POTEAT,S          Radiology Studies: Dg Chest 1 View  Result Date: 08/28/2017 CLINICAL DATA:  Shortness of breath. EXAM: CHEST 1 VIEW COMPARISON:  08/26/2017.  CT 08/24/2017. FINDINGS: Mediastinum hilar structures stable. Stable cardiomegaly. Persistent consolidation in the left mid and lower lung with persistent left-sided pleural effusion. Cavitary  process in the left lung base best identified by prior CT. Persistent mild right lung interstitial prominence. No pneumothorax identified. IMPRESSION: 1. Persistent dense consolidation of the left mid and lower lung with persistent left-sided pleural effusion. Cavitary process in the left lung base best identified by prior CT. 2. Stable cardiomegaly. Persistent mild increase interstitial markings noted within the right lung. Component mild interstitial edema may be present. Electronically Signed   By: Marcello Moores  Register   On: 08/28/2017 16:00        Scheduled Meds: . Chlorhexidine Gluconate Cloth  6 each Topical Q0600  . feeding supplement (GLUCERNA SHAKE)  237 mL Oral TID BM  . guaiFENesin  600 mg Oral BID  . ipratropium-albuterol  3 mL Nebulization Q6H WA  . loratadine  10 mg Oral Daily  . mupirocin ointment  1 application Nasal BID  . pravastatin  20 mg Oral Daily  . senna  1 tablet Oral BID  . sodium chloride flush  3 mL Intravenous Q12H  . tamsulosin  0.4 mg Oral Daily  . Warfarin - Pharmacist Dosing Inpatient   Does not apply q1800   Continuous Infusions: . sodium chloride    . ceFEPime (MAXIPIME) IV 1 g (08/29/17 0731)     LOS: 5 days     Georgette Shell, MD [facility] If 7PM-7AM, please contact night-coverage [email or web address] Password [email or web address] 08/29/2017, 8:00 AM

## 2017-08-29 NOTE — Progress Notes (Signed)
OT Cancellation Note  Patient Details Name: COLTON TASSIN MRN: [id number] DOB: 06-17-1946   Cancelled Treatment:    Reason Eval/Treat Not Completed: Other (comment).  MD in room with pt. Will check back.  Jahlil Ziller 08/29/2017, 3:04 PM  Lesle Chris, OTR/L [phone] 08/29/2017

## 2017-08-29 NOTE — Progress Notes (Signed)
[place] for Warfarin Indication: Hx DVT/PE, afib  Allergies  Allergen Reactions  . Fish Allergy     Sweaty, upset stomach  . Penicillins     Blackouts  Has patient had a PCN reaction causing immediate rash, facial/tongue/throat swelling, SOB or lightheadedness with hypotension: Yes Has patient had a PCN reaction causing severe rash involving mucus membranes or skin necrosis: No Has patient had a PCN reaction that required hospitalization: No Has patient had a PCN reaction occurring within the last 10 years: No If all of the above answers are "NO", then may proceed with Cephalosporin use.     Patient Measurements:   Vital Signs: Temp: 98.1 F (36.7 C) (01/23 0333) Temp Source: Oral (01/23 0333) BP: 115/58 (01/23 0333) Pulse Rate: 109 (01/23 0333)  Labs: Recent Labs    08/27/17 0607 08/28/17 0415 08/29/17 0818  HGB 8.6* 7.8* 8.4*  HCT 27.8* 24.9* 27.4*  PLT 242 212 177  LABPROT 27.7* 25.1* 22.3*  INR 2.61 2.30 1.97  CREATININE 0.57* 0.56* 0.54*    Estimated Creatinine Clearance: 90.6 mL/min (A) (by C-G formula based on SCr of 0.54 mg/dL (L)).   Medications:  Scheduled:  . Chlorhexidine Gluconate Cloth  6 each Topical Q0600  . feeding supplement (GLUCERNA SHAKE)  237 mL Oral TID BM  . guaiFENesin  600 mg Oral BID  . ipratropium-albuterol  3 mL Nebulization TID  . loratadine  10 mg Oral Daily  . mupirocin ointment  1 application Nasal BID  . pravastatin  20 mg Oral Daily  . senna  1 tablet Oral BID  . sodium chloride flush  3 mL Intravenous Q12H  . tamsulosin  0.4 mg Oral Daily  . Warfarin - Pharmacist Dosing Inpatient   Does not apply q1800   Infusions:  . sodium chloride    . ceFEPime (MAXIPIME) IV Stopped (08/29/17 0826)   PRN:   Assessment: 72 yo male on chronic warfarin for hx afib and DVT/PE and Afib. Home dose reported as 2.5 mg PO daily. INR therapeutic on admission. Pharmacy consulted to continue dosing on  admission 1/18  Today, 08/29/17  INR now slightly low; note reduced dose given 1/21  CBC: hgb low- decreased to previous levels s/p PRBC 1/20, pltc remain WNL but noted to be trending down  AST/ALT noted to be elevated 1/21 (previously WNL) - ? Related to chemotherapy; doesn't appear to be chronic or progressive as INR unaffected   On APAP - last doses 1/20 and percocet, also on pravastatin  No reported bleeding despite anemia  Goal of Therapy:  INR 2-3 Monitor platelets by anticoagulation protocol: Yes   Plan:   Warfarin 3 mg PO x 1 tonight   Daily INR  CBC at least q72 hr while on warfarin inpatient  Suggest repeat LFTs and adjust possibly adjust meds that can increase liver enzymes   Per MD, will hold statin, APAP, change percocet to plain oxycodone IR  Reuel Boom, PharmD, BCPS [phone] 08/29/2017, 1:22 PM

## 2017-08-30 MED ORDER — ALBUTEROL SULFATE (2.5 MG/3ML) 0.083% IN NEBU
2.5000 mg | INHALATION_SOLUTION | RESPIRATORY_TRACT | Status: DC | PRN
  Administered 2017-08-31: 2.5 mg via RESPIRATORY_TRACT
  Filled 2017-08-30: qty 3

## 2017-08-30 MED ORDER — IPRATROPIUM-ALBUTEROL 0.5-2.5 (3) MG/3ML IN SOLN: 3.0000 mL | Freq: Four times a day (QID) | RESPIRATORY_TRACT | Status: DC

## 2017-08-30 NOTE — Progress Notes (Signed)
PROGRESS NOTE  William Stanley JYN:[id number] DOB: 05-29-1946 DOA: 08/23/2017 PCP: [name], PA-C   LOS: 6 days   Brief Narrative / Interim history: 72 y.o.malewith h/o Lung Ca on ChemoRx, h/oDVT/PE, A. fib on chronic anticoagulation(Coumadin),who presented with complaints of back and leg pain.Patient is a poor historian, history obtained from patient's sister at bedside, ,no vomiting no diarrhea, no frank chest pains,patient had tachycardia, leukocytosis and a fever of 100.8 on admission, patient had increased oxygen requirement, increased work of breathing, patient met sepsis criteria on admission,lactic acid was not elevated.Sepsis is presumed to be secondary to combination of pneumonia and possible UTI,Ultimately patient had CT scan of the abdomen which showed a postobstructive pneumonia with bronchopulmonary fistula. Oncology and palliative care consulted, now comfort and to be transitioned to residential hospice.   Assessment & Plan: Principal Problem:   Sepsis ([place]) Active Problems:   Atrial fibrillation (HCC)   Warfarin anticoagulation   Diabetes mellitus type 2, uncomplicated (HCC)   Stage IV squamous cell carcinoma of left lung (HCC)   PNA (pneumonia)   UTI (urinary tract infection)   Bronchopleural fistula (HCC)   Palliative care encounter   Goals of care, counseling/discussion   Acute hypoxic respiratory failure secondary to healthcare associated pneumonia and bronchopulmonary fistula / loculated malignant basilar left hydropneumothorax -patient was initially on broad-spectrum antibiotics, after discussing with palliative regarding goals of care treatment is now focusing towards comfort. -awaiting residential hospice placement History of PE DVT  -on Coumadin History of A. fib  -on Coumadin Type 2 diabetes  -takes metformin at home which is on hold. Lung cancer status post recent chemotherapy 5 days ago. Now hospice Left main and 3 vessel coronary  atherosclerosis Anemia of chronic disease. -Status post blood transfusion his hemoglobin improved   DVT prophylaxis: Coumadin Code Status: DNR Family Communication: family at bedside Disposition Plan: residential hospice when bed available  Consultants:   Oncology  Palliative  PCCM  Procedures:  None   Antimicrobials:  Cefepime << 1/23   Subjective: -feeling OK at rest however very dyspneic with minimal activity   Objective: Vitals:   08/29/17 0333 08/29/17 0825 08/29/17 1421 08/29/17 2258  BP: (!) 115/58  108/70   Pulse: (!) 109  95   Resp: 20  16   Temp: 98.1 F (36.7 C)  98.2 F (36.8 C)   TempSrc: Oral  Oral   SpO2:  97% 98% 96%    Intake/Output Summary (Last 24 hours) at 08/30/2017 1113 Last data filed at 08/29/2017 1422 Gross per 24 hour  Intake 360 ml  Output 200 ml  Net 160 ml   There were no vitals filed for this visit.  Examination:  Constitutional: NAD Eyes:  lids and conjunctivae normal ENMT: Mucous membranes are moist.  Respiratory: decreased breath sounds bilaterally especially at the bases, no wheezing, no crackles. Cardiovascular: Regular rate and rhythm, no murmurs / rubs / gallops. No LE edema.  Abdomen: no tenderness. Bowel sounds positive.  Skin: no rashes   Data Reviewed: I have independently reviewed following labs and imaging studies   CBC: Recent Labs  Lab 08/23/17 2021  08/25/17 1217 08/26/17 0625 08/27/17 0607 08/28/17 0415 08/29/17 0818  WBC 13.6*   < > 3.9* 3.3* 2.7* 1.9* 1.6*  NEUTROABS 13.0*  --   --  2.7 2.0 1.0* 0.2*  HGB 9.4*   < > 7.1* 7.6* 8.6* 7.8* 8.4*  HCT 30.3*   < > 23.4* 24.4* 27.8* 24.9* 27.4*  MCV 89.6   < >  91.1 89.4 90.3 89.6 90.4  PLT 496*   < > 334 279 242 212 177   < > = values in this interval not displayed.   Basic Metabolic Panel: Recent Labs  Lab 08/25/17 0656 08/26/17 0625 08/27/17 0607 08/28/17 0415 08/29/17 0818  NA 135 134* 136 134* 135  K 3.2* 3.5 3.5 3.4* 3.6  CL 100*  99* 98* 97* 95*  CO2 28 30 32 31 31  GLUCOSE 108* 106* 106* 115* 97  BUN _0 CREATININE 0.56* 0.54* 0.57* 0.56* 0.54*  CALCIUM 8.2* 8.3* 8.6* 8.3* 8.6*  MG  --  1.6* 2.0 1.8 1.8  PHOS  --  3.1 3.0 2.8 3.2   GFR: Estimated Creatinine Clearance: 90.6 mL/min (A) (by C-G formula based on SCr of 0.54 mg/dL (L)). Liver Function Tests: Recent Labs  Lab 08/23/17 2021 08/27/17 0607  AST 29 301*  ALT 20 136*  ALKPHOS 68 123  BILITOT 0.9 0.9  PROT 7.3 6.2*  ALBUMIN 2.3* 1.8*   No results for input(s): LIPASE, AMYLASE in the last 168 hours. No results for input(s): AMMONIA in the last 168 hours. Coagulation Profile: Recent Labs  Lab 08/25/17 0656 08/26/17 0625 08/27/17 0607 08/28/17 0415 08/29/17 0818  INR 2.10 2.31 2.61 2.30 1.97   Cardiac Enzymes: No results for input(s): CKTOTAL, CKMB, CKMBINDEX, TROPONINI in the last 168 hours. BNP (last 3 results) No results for input(s): PROBNP in the last 8760 hours. HbA1C: No results for input(s): HGBA1C in the last 72 hours. CBG: Recent Labs  Lab 08/24/17 1731  GLUCAP 104*   Lipid Profile: No results for input(s): CHOL, HDL, LDLCALC, TRIG, CHOLHDL, LDLDIRECT in the last 72 hours. Thyroid Function Tests: No results for input(s): TSH, T4TOTAL, FREET4, T3FREE, THYROIDAB in the last 72 hours. Anemia Panel: No results for input(s): VITAMINB12, FOLATE, FERRITIN, TIBC, IRON, RETICCTPCT in the last 72 hours. Urine analysis:    Component Value Date/Time   COLORURINE YELLOW 08/24/2017 0722   APPEARANCEUR HAZY (A) 08/24/2017 0722   LABSPEC >1.046 (H) 08/24/2017 0722   PHURINE 5.0 08/24/2017 0722   GLUCOSEU NEGATIVE 08/24/2017 0722   HGBUR MODERATE (A) 08/24/2017 0722   BILIRUBINUR NEGATIVE 08/24/2017 0722   KETONESUR NEGATIVE 08/24/2017 0722   PROTEINUR 30 (A) 08/24/2017 0722   NITRITE NEGATIVE 08/24/2017 0722   LEUKOCYTESUR LARGE (A) 08/24/2017 0722   Sepsis Labs: Invalid input(s): PROCALCITONIN, LACTICIDVEN  Recent  Results (from the past 240 hour(s))  Blood Culture (routine x 2)     Status: None   Collection Time: 08/23/17  8:21 PM  Result Value Ref Range Status   Specimen Description BLOOD LEFT FOREARM  Final   Special Requests   Final    BOTTLES DRAWN AEROBIC AND ANAEROBIC Blood Culture adequate volume   Culture   Final    NO GROWTH 5 DAYS Performed at [facility], [address], [place], [place] [address]    Report Status 08/28/2017 FINAL  Final  Blood Culture (routine x 2)     Status: None   Collection Time: 08/23/17  8:21 PM  Result Value Ref Range Status   Specimen Description BLOOD RIGHT ANTECUBITAL  Final   Special Requests   Final    BOTTLES DRAWN AEROBIC AND ANAEROBIC Blood Culture adequate volume   Culture   Final    NO GROWTH 5 DAYS Performed at [facility], [place] [address] [place], [place] [address]    Report Status 08/28/2017 FINAL  Final  Urine culture     Status: Abnormal   Collection Time: 08/24/17  7:27 AM  Result Value Ref Range Status   Specimen Description URINE, CATHETERIZED  Final   Special Requests NONE  Final   Culture MULTIPLE SPECIES PRESENT, SUGGEST RECOLLECTION (A)  Final   Report Status 08/25/2017 FINAL  Final  Culture, expectorated sputum-assessment     Status: None   Collection Time: 08/24/17 12:15 PM  Result Value Ref Range Status   Specimen Description EXPECTORATED SPUTUM  Final   Special Requests NONE  Final   Sputum evaluation THIS SPECIMEN IS ACCEPTABLE FOR SPUTUM CULTURE  Final   Report Status 08/24/2017 FINAL  Final  Culture, respiratory (NON-Expectorated)     Status: None   Collection Time: 08/24/17 12:15 PM  Result Value Ref Range Status   Specimen Description EXPECTORATED SPUTUM  Final   Special Requests NONE Reflexed from [id number]  Final   Gram Stain   Final    FEW WBC PRESENT, PREDOMINANTLY PMN ABUNDANT GRAM POSITIVE COCCI IN CLUSTERS MODERATE GRAM NEGATIVE RODS FEW GRAM POSITIVE RODS    Culture   Final    Consistent with  normal respiratory flora. Performed at [facility], [place] [address], [place], [place] [address]    Report Status 08/27/2017 FINAL  Final  MRSA PCR Screening     Status: Abnormal   Collection Time: 08/24/17  2:10 PM  Result Value Ref Range Status   MRSA by PCR POSITIVE (A) NEGATIVE Final    Comment:        The GeneXpert MRSA Assay (FDA approved for NASAL specimens only), is one component of a comprehensive MRSA colonization surveillance program. It is not intended to diagnose MRSA infection nor to guide or monitor treatment for MRSA infections. RESULT CALLED TO, READ BACK BY AND VERIFIED WITH: FRASER,B @ 1803 ON [id number] BY POTEAT,S       Radiology Studies: Dg Chest 1 View  Result Date: 08/28/2017 CLINICAL DATA:  Shortness of breath. EXAM: CHEST 1 VIEW COMPARISON:  08/26/2017.  CT 08/24/2017. FINDINGS: Mediastinum hilar structures stable. Stable cardiomegaly. Persistent consolidation in the left mid and lower lung with persistent left-sided pleural effusion. Cavitary process in the left lung base best identified by prior CT. Persistent mild right lung interstitial prominence. No pneumothorax identified. IMPRESSION: 1. Persistent dense consolidation of the left mid and lower lung with persistent left-sided pleural effusion. Cavitary process in the left lung base best identified by prior CT. 2. Stable cardiomegaly. Persistent mild increase interstitial markings noted within the right lung. Component mild interstitial edema may be present. Electronically Signed   By: Marcello Moores  Register   On: 08/28/2017 16:00     Scheduled Meds: . Chlorhexidine Gluconate Cloth  6 each Topical Q0600  . feeding supplement (GLUCERNA SHAKE)  237 mL Oral TID BM  . guaiFENesin  600 mg Oral BID  . ipratropium-albuterol  3 mL Nebulization TID  . loratadine  10 mg Oral Daily  . mupirocin ointment  1 application Nasal BID  . senna  1 tablet Oral BID  . sodium chloride flush  3 mL Intravenous Q12H  .  tamsulosin  0.4 mg Oral Daily  . Warfarin - Pharmacist Dosing Inpatient   Does not apply q1800   Continuous Infusions: . sodium chloride       Marzetta Board, MD, PhD [facility] Pager [phone]  If 7PM-7AM, please contact night-coverage [email or web address] Password [email or web address] 08/30/2017, 11:13 AM

## 2017-08-30 NOTE — Progress Notes (Signed)
Nutrition Brief Note  Chart reviewed. Pt now transitioning to comfort care.  No further nutrition interventions warranted at this time.  Please re-consult as needed.   Mariana Single RD, LDN Clinical Nutrition Pager # [phone]

## 2017-08-30 NOTE — Clinical Social Work Note (Signed)
Clinical Social Work Assessment  Patient Details  Name: William Stanley MRN: [id number] Date of Birth: Aug 23, 1945  Date of referral:  08/30/17               Reason for consult:  End of Life/Hospice                Permission sought to share information with:  Chartered certified accountant granted to share information::     Name::        Agency::  [facility]  Relationship::     Contact Information:     Housing/Transportation Living arrangements for the past 2 months:  Single Family Home Source of Information:  Patient, Spouse Patient Interpreter Needed:  None Criminal Activity/Legal Involvement Pertinent to Current Situation/Hospitalization:  No - Comment as needed Significant Relationships:  Spouse, Siblings Lives with:  Spouse Do you feel safe going back to the place where you live?    Need for family participation in patient care:  Yes (Comment)  Care giving concerns:  Patient from home with spouse. Palliative following and recommended residential hospice.   Social Worker assessment / plan:  CSW spoke with patient/patient's wife at bedside regarding consult for residential hospice. Patient and wife confirmed plan to discharge to residential hospice. Patient's wife reported that they preferred [facility] because patient's brother and sister live in [place]. Patient reported that he preferred to move Saturday. Patient's wife replied that patient understands he will have to move when bed is available. CSW agreed to make referral to [facility]. CSW explained that there are other surrounding hospice facilities if no beds are available at [facility]. CSW agreed to follow up with patient after making referral to hospice.  CSW made referral to [facility].  CSW contacted by [facility] staff member Judeen Hammans and informed that patient's referral was received, staff reported no beds available right now.   CSW provided update to patient that [facility] had no beds  available today. CSW inquired if patient was interested in other surrounding residential hospice facilities. Patient reported that he only wants to go to [facility]. Patient reported that he would prefer to go home but he is not aware if that is a possibility. CSW agreed to notify patient's RNCM to follow up with patient regarding discharge plans.  CSW notified patient's RNCM that patient is only interested in [facility]. RNCM agreed to follow up with patient's regarding discharge planning.  Employment status:  Retired Nurse, adult PT Recommendations:  Not assessed at this time Information / Referral to community resources:  Other (Comment Required)(Residential hospice)  Patient/Family's Response to care:  Patient/patient's wife appreciative to CSW assistance with discharge planning and referral to residential hospice.   Patient/Family's Understanding of and Emotional Response to Diagnosis, Current Treatment, and Prognosis:  Patient presented calm and verbalized understanding of diagnosis and current treatment. Patient reported that he knows his diagnosis is bad and that he is hopeful that he might get better. Patient reported that he knows if he's going to [facility] he may not have Wyolene Weimann left. Patient accepting of diagnosis while helpful about prognosis. CSW provided active listening and emotional support.   Emotional Assessment Appearance:  Appears stated age Attitude/Demeanor/Rapport:  Other(Cooperative) Affect (typically observed):  Calm, Quiet Orientation:  Oriented to Self, Oriented to Place, Oriented to  Time, Oriented to Situation Alcohol / Substance use:  Not Applicable Psych involvement (Current and /or in the community):  No (Comment)  Discharge Needs  Concerns to be addressed:  Care Coordination Readmission within the last 30 days:  No Current discharge risk:  Terminally ill Barriers to Discharge:  No Barriers Identified   Burnis Medin,  LCSW 08/30/2017, 9:50 AM

## 2017-08-30 NOTE — Progress Notes (Signed)
[facility] Liaison:  RN  Received request from Oval Linsey, for family interest in [facility].  Chart being reviewed and spoke with Elender (spouse) over the phone to acknowledge referral.  Unfortunately [facility] is not able to offer a room today.  Family and CSW are aware [facility] liaison will follow up with CSW and family tomorrow or sooner if room becomes available.  Please do not hesitate to call with questions.   Thank you for this referral.  [facility] Liaison [phone]  All hospital liaisons are now on [place].

## 2017-08-31 MED ORDER — HALOPERIDOL 0.5 MG PO TABS: 0.5000 mg | ORAL_TABLET | ORAL | Status: AC | PRN

## 2017-08-31 MED ORDER — MORPHINE SULFATE (CONCENTRATE) 10 MG/0.5ML PO SOLN: 5.0000 mg | ORAL | Status: AC | PRN

## 2017-08-31 MED ORDER — GLUCERNA SHAKE PO LIQD: 237.0000 mL | Freq: Three times a day (TID) | ORAL | 0 refills | Status: AC

## 2017-08-31 MED ORDER — ALBUTEROL SULFATE (2.5 MG/3ML) 0.083% IN NEBU
2.5000 mg | INHALATION_SOLUTION | RESPIRATORY_TRACT | Status: DC | PRN
  Administered 2017-08-31: 2.5 mg via RESPIRATORY_TRACT
  Filled 2017-08-31: qty 3

## 2017-08-31 MED ORDER — IPRATROPIUM-ALBUTEROL 0.5-2.5 (3) MG/3ML IN SOLN
3.0000 mL | RESPIRATORY_TRACT | Status: DC
  Administered 2017-08-31 (×2): 3 mL via RESPIRATORY_TRACT
  Filled 2017-08-31 (×2): qty 3

## 2017-08-31 MED ORDER — GLYCOPYRROLATE 1 MG PO TABS: 1.0000 mg | ORAL_TABLET | ORAL | Status: AC | PRN

## 2017-08-31 MED ORDER — OXYCODONE-ACETAMINOPHEN 5-325 MG PO TABS: 1.0000 | ORAL_TABLET | Freq: Four times a day (QID) | ORAL | 0 refills | Status: DC | PRN

## 2017-08-31 MED ORDER — LORAZEPAM 2 MG/ML PO CONC: 1.0000 mg | ORAL | 0 refills | Status: AC | PRN

## 2017-08-31 NOTE — Progress Notes (Signed)
LCSW following for residential hospice placement.   [facility]ice [facility].  Patient will transport by [facility].   LCSW faxed [facility] docs via hub.   RN report #: [phone]

## 2017-08-31 NOTE — Discharge Summary (Signed)
Physician Discharge Summary  William Stanley WGN:[id number] DOB: Jun 21, 1946 DOA: 08/23/2017  PCP: [name], PA-C  Admit date: 08/23/2017 Discharge date: 08/31/2017  Admitted From: home Disposition:  Residential hospice, [place] [facility]  Recommendations for Outpatient Follow-up:  1. Discharged for end-of-life care  Home Health: none Equipment/Devices: none  Discharge Condition: guarded CODE STATUS: DNR Diet recommendation: comfort feeding, as tolerated   HPI: Per Dr. Ronney Lion Iiams  is a 72 y.o. male with h/o Lung Ca on ChemoRx, h/o DVT/PE, A. fib on chronic anticoagulation (Coumadin), who presented with complaints of back and leg pain.   Patient is a poor historian, history obtained from patient's sister at bedside, , no vomiting no diarrhea, no frank chest pains , patient had tachycardia, leukocytosis and a fever of 100.8 on admission, patient had increased oxygen requirement, increased work of breathing, patient met sepsis criteria on admission, lactic acid was not elevated.  Sepsis is presumed to be secondary to combination of pneumonia and possible UTI,Ultimately patient had CT scan of the abdomen which showed a postobstructive pneumonia with bronchopulmonary fistula.    In ED.... Patient received 40-year-old vancomycin,  Levaquin and azactam due to concerns about penicillin allergy  Hospital Course: Acute hypoxic respiratory failure secondary to healthcare associated pneumonia and bronchopulmonary fistula / loculated malignant basilar left hydropneumothorax -patient was initially on broad-spectrum antibiotics, after discussing with palliative regarding goals of care treatment is now focusing towards comfort. -residential hospice placement History of PE DVT  -on Coumadin, hold as comfort measures are in place History of A. fib  -on Coumadin, hold as comfort measures are in place Type 2 diabetes  -continue home medications Lung cancer status post recent  chemotherapy 5 days ago. Now hospice Left main and 3 vessel coronary atherosclerosis Anemia of chronic disease. 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[place])   Palliative care encounter   Goals of care, counseling/discussion     Discharge Instructions   Allergies as of 08/31/2017      Reactions   Fish Allergy    Sweaty, upset stomach   Penicillins    Blackouts  Has patient had a PCN reaction causing immediate rash, facial/tongue/throat swelling, SOB or lightheadedness with hypotension: Yes Has patient had a PCN reaction causing severe rash involving mucus membranes or skin necrosis: No Has patient had a PCN reaction that required hospitalization: No Has patient had a PCN reaction occurring within the last 10 years: No If all of the above answers are "NO", then may proceed with Cephalosporin use.      Medication List    STOP taking these medications   oxyCODONE-acetaminophen 5-325 MG tablet Commonly known as:  PERCOCET/ROXICET   pravastatin 20 MG tablet Commonly known as:  PRAVACHOL   warfarin 5 MG tablet Commonly known as:  COUMADIN     TAKE these medications   ACCU-CHEK AVIVA PLUS test strip Generic drug:  glucose blood CHECK BLOOD SUGAR THREE TIMES DAILY   ACCU-CHEK SOFTCLIX LANCETS lancets CHECK BLOOD SUGAR THREE TIMES DAILY   acetaminophen 500 MG tablet Commonly known as:  TYLENOL Take 1,000 mg daily as needed by mouth for moderate pain or headache.  Alcohol Pads 70 % Pads Use to check blood sugar daily   bismuth subsalicylate 517 OH/60VP suspension Commonly known as:  PEPTO BISMOL Take 30 mLs every 6 (six) hours as needed  by mouth for indigestion.   blood glucose meter kit and supplies Kit Dispense based on patient and insurance preference. Check blood sugar three times daily  (FOR ICD-10E11.5).   cetirizine 10 MG tablet Commonly known as:  ZYRTEC TAKE 1 TABLET BY MOUTH EVERY DAY   docusate sodium 100 MG capsule Commonly known as:  COLACE Take 100 mg by mouth daily as needed for mild constipation or moderate constipation.   feeding supplement (GLUCERNA SHAKE) Liqd Take 237 mLs by mouth 3 (three) times daily between meals.   glycopyrrolate 1 MG tablet Commonly known as:  ROBINUL Take 1 tablet (1 mg total) by mouth every 4 (four) hours as needed (excessive secretions).   haloperidol 0.5 MG tablet Commonly known as:  HALDOL Take 1 tablet (0.5 mg total) by mouth every 4 (four) hours as needed for agitation (or delirium).   HYDROcodone-homatropine 5-1.5 MG/5ML syrup Commonly known as:  HYCODAN Take 5 mLs by mouth every 6 (six) hours as needed for cough.   LORazepam 2 MG/ML concentrated solution Commonly known as:  ATIVAN Place 0.5 mLs (1 mg total) under the tongue every 4 (four) hours as needed for anxiety.   metFORMIN 500 MG tablet Commonly known as:  GLUCOPHAGE TAKE 1 TABLET BY MOUTH TWICE DAILY WITH MEALS   morphine CONCENTRATE 10 MG/0.5ML Soln concentrated solution Take 0.25 mLs (5 mg total) by mouth every 2 (two) hours as needed for moderate pain (or dyspnea).   ondansetron 4 MG tablet Commonly known as:  ZOFRAN Take 1 tablet (4 mg total) by mouth every 8 (eight) hours as needed for nausea or vomiting.   polyethylene glycol packet Commonly known as:  MIRALAX / GLYCOLAX Take 17 g by mouth daily as needed for mild constipation or moderate constipation.   prochlorperazine 10 MG tablet Commonly known as:  COMPAZINE Take 1 tablet (10 mg total) by mouth every 6 (six) hours as needed for nausea or vomiting.   tamsulosin 0.4 MG Caps capsule Commonly known as:  FLOMAX Take 1 capsule (0.4 mg  total) by mouth daily. What changed:  when to take this   VENTOLIN HFA 108 (90 Base) MCG/ACT inhaler Generic drug:  albuterol INHALE 1-2 PUFFS INTO LUNGS AS NEEDED What changed:  See the new instructions.        Consultations:  Oncology  Palliative care  Pulmonary   Procedures/Studies:  None   Dg Chest 1 View  Result Date: 08/28/2017 CLINICAL DATA:  Shortness of breath. EXAM: CHEST 1 VIEW COMPARISON:  08/26/2017.  CT 08/24/2017. FINDINGS: Mediastinum hilar structures stable. Stable cardiomegaly. Persistent consolidation in the left mid and lower lung with persistent left-sided pleural effusion. Cavitary process in the left lung base best identified by prior CT. Persistent mild right lung interstitial prominence. No pneumothorax identified. IMPRESSION: 1. Persistent dense consolidation of the left mid and lower lung with persistent left-sided pleural effusion. Cavitary process in the left lung base best identified by prior CT. 2. Stable cardiomegaly. Persistent mild increase interstitial markings noted within the right lung. Component mild interstitial edema may be present. Electronically Signed   By: Marcello Moores  Register   On: 08/28/2017 16:00   Dg Chest 2 View  Result Date: 08/26/2017 CLINICAL DATA:  Pitting edema. EXAM: CHEST  2 VIEW COMPARISON:  Chest CT 08/24/2017 FINDINGS: The cardiac  silhouette is partially obscured by dense left lower lobe airspace consolidation and left hydropneumothorax. The aeration of the left lung has slightly worsened when compared to 08/23/2017. Patchy peribronchial airspace opacities are now seen in the left upper lobe and left middle lobe in addition to the left lower lobe malignant process. Mild atelectasis versus airspace consolidation in the right lower lobe. Right apical peribronchial thickening. IMPRESSION: Slight worsening of the aeration of the left lung with known left lower lobe postobstructive airspace consolidation and malignant left  hydropneumothorax. Slight increase in peribronchial airspace consolidation in the left middle and left upper lobes. Electronically Signed   By: Fidela Salisbury M.D.   On: 08/26/2017 11:06   Ct Angio Chest Pe W/cm &/or Wo Cm  Result Date: 08/24/2017 CLINICAL DATA:  Left lower lobe lung cancer on chemotherapy. Dyspnea. EXAM: CT ANGIOGRAPHY CHEST WITH CONTRAST TECHNIQUE: Multidetector CT imaging of the chest was performed using the standard protocol during bolus administration of intravenous contrast. Multiplanar CT image reconstructions and MIPs were obtained to evaluate the vascular anatomy. CONTRAST:  133m ISOVUE-370 IOPAMIDOL (ISOVUE-370) INJECTION 76% COMPARISON:  Chest radiograph from one day prior. 06/25/2017 PET-CT and 05/17/2017 chest CT. FINDINGS: Cardiovascular: The study is low quality for the evaluation of pulmonary embolism, significantly limited by motion artifact, which largely precludes evaluation of the segmental and subsegmental pulmonary artery branches. There are no filling defects in the central, lobar or segmental pulmonary artery branches to suggest acute pulmonary embolism. Atherosclerotic nonaneurysmal thoracic aorta top-normal caliber main pulmonary artery (3.0 cm diameter), stable. Top-normal heart size. No significant pericardial fluid/thickening. Left main and 3 vessel coronary atherosclerosis. Mediastinum/Nodes: No discrete thyroid nodules. Unremarkable esophagus. No axillary adenopathy. Mildly enlarged 1.1 cm left upper paraesophageal mediastinal node (series 8/image 37), increased from 0.7 cm on 06/25/2017 PET-CT. Stable mildly enlarged 1.0 cm left subcarinal node (series 8/image 102). Mildly enlarged 1.0 cm right lower paraesophageal mediastinal node (series 8/image 154), increased from 0.8 cm on 06/25/2017. Mildly enlarged 1.0 cm left hilar node (series 8/image 90), decreased from 1.4 cm on 05/17/2017 chest CT. No right hilar adenopathy. Lungs/Pleura: No pneumothorax. No  right pleural effusion. Small to moderate dependent basilar left hydropneumothorax with circumferential left pleural thickening and enhancement, slightly increased in size with new pneumothorax component since 06/25/2017 PET-CT. Patchy consolidation throughout the left upper lobe and left lower lobe, largely new in the left upper lobe and slightly worsened in the left lower lobe. Centrally necrotic left lower lobe lung mass measures approximately 5.9 x 4.7 cm and contains an air-fluid level (series 8/image 135), decreased from 7.2 x 6.6 cm on 06/25/2017 PET-CT using similar measurement technique. There is direct communication of the cavitary left lower lobe lung mass with a segmental left lower lobe bronchus and with the left pleural space, compatible with a new bronchopleural fistula. Upper abdomen: Small hiatal hernia. Musculoskeletal: No aggressive appearing focal osseous lesions. Moderate thoracic spondylosis. Review of the MIP images confirms the above findings. IMPRESSION: 1. No evidence of pulmonary embolism on this limited motion degraded scan. 2. Cavitary left lower lobe lung mass is decreased in size. 3. 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 4. New/increased postobstructive pneumonia throughout the left lung. 5. Left hilar adenopathy is decreased. 6. Mediastinal adenopathy is mildly increased, cannot exclude progressive metastatic mediastinal adenopathy. 7. Left main and 3 vessel coronary atherosclerosis. Aortic Atherosclerosis (ICD10-I70.0). Electronically Signed   By: JIlona SorrelM.D.   On: 08/24/2017 01:29  Ct Lumbar Spine Wo Contrast  Result Date: 08/24/2017 CLINICAL DATA:  72 y/o M; history of lung cancer receiving chemotherapy. Back and leg pain, question metastasis. EXAM: CT LUMBAR SPINE WITHOUT CONTRAST TECHNIQUE: Multidetector CT imaging of the lumbar spine was performed without  intravenous contrast administration. Multiplanar CT image reconstructions were also generated. COMPARISON:  None. FINDINGS: Segmentation: 5 lumbar type vertebrae. Alignment: Normal. Vertebrae: No acute fracture or focal pathologic process. Paraspinal and other soft tissues: IVC filter in situ. IVC filter prongs extend beyond the lumen of the IVC. Moderate calcific atherosclerosis of the abdominal aorta and iliac arteries. Disc levels: Mild lumbar spondylosis with multilevel disc and facet degenerative changes greatest at the L3 through S1 levels where there is mild foraminal stenosis. No significant canal stenosis. IMPRESSION: 1. No bony sclerotic or destructive lesion to suggest metastatic disease. No acute fracture. 2. Mild lumbar spondylosis. 3. Aortic atherosclerosis. Electronically Signed   By: Kristine Garbe M.D.   On: 08/24/2017 01:05   Dg Chest Port 1 View  Result Date: 08/23/2017 CLINICAL DATA:  72 year old male with history of lung cancer. Patient is currently on chemotherapy and presents with fever. EXAM: PORTABLE CHEST 1 VIEW COMPARISON:  Chest radiograph dated 07/25/2017, CT dated 05/17/2017 and PET-CT dated 06/25/2017 FINDINGS: Left mid to lower lung field airspace densities appear relatively similar to the prior radiograph of 07/25/2017 and likely combination of mass, pleural effusion, and associated atelectasis versus infiltrate. Stable rounded lucency may represent a necrotic focus. The right lung is clear. There is no pneumothorax. Evaluation of the lung apices is limited due to superimposition of the patient's mandible. Stable cardiac silhouette. No acute osseous pathology. IMPRESSION: Left mid to lower lung field airspace opacity relatively similar to prior radiograph and may represent combination of mass, atelectasis, and effusion. Pneumonia is not excluded. Clinical correlation is recommended. Electronically Signed   By: Anner Crete M.D.   On: 08/23/2017 21:24      Subjective: - no chest pain, shortness of breath, no abdominal pain, nausea or vomiting. Had several bouts of coughing overnight  Discharge Exam: Vitals:   08/31/17 0428 08/31/17 0816  BP:    Pulse:    Resp:    Temp:    SpO2: 96% 93%    General: Pt is alert, awake, not in acute distress Cardiovascular: RRR, S1/S2 +, no rubs, no gallops Respiratory: CTA bilaterally, no wheezing, no rhonchi Abdominal: Soft, NT, ND, bowel sounds + Extremities: no edema, no cyanosis    The results of significant diagnostics from this hospitalization (including imaging, microbiology, ancillary and laboratory) are listed below for reference.     Microbiology: Recent Results (from the past 240 hour(s))  Blood Culture (routine x 2)     Status: None   Collection Time: 08/23/17  8:21 PM  Result Value Ref Range Status   Specimen Description BLOOD LEFT FOREARM  Final   Special Requests   Final    BOTTLES DRAWN AEROBIC AND ANAEROBIC Blood Culture adequate volume   Culture   Final    NO GROWTH 5 DAYS Performed at [facility], [address], [place], [place] [address]    Report Status 08/28/2017 FINAL  Final  Blood Culture (routine x 2)     Status: None   Collection Time: 08/23/17  8:21 PM  Result Value Ref Range Status   Specimen Description BLOOD RIGHT ANTECUBITAL  Final   Special Requests   Final    BOTTLES DRAWN AEROBIC AND ANAEROBIC Blood Culture adequate volume  Culture   Final    NO GROWTH 5 DAYS Performed at [facility], [place] [address] [place], [place] [address]    Report Status 08/28/2017 FINAL  Final  Urine culture     Status: Abnormal   Collection Time: 08/24/17  7:27 AM  Result Value Ref Range Status   Specimen Description URINE, CATHETERIZED  Final   Special Requests NONE  Final   Culture MULTIPLE SPECIES PRESENT, SUGGEST RECOLLECTION (A)  Final   Report Status 08/25/2017 FINAL  Final  Culture, expectorated sputum-assessment     Status: None   Collection  Time: 08/24/17 12:15 PM  Result Value Ref Range Status   Specimen Description EXPECTORATED SPUTUM  Final   Special Requests NONE  Final   Sputum evaluation THIS SPECIMEN IS ACCEPTABLE FOR SPUTUM CULTURE  Final   Report Status 08/24/2017 FINAL  Final  Culture, respiratory (NON-Expectorated)     Status: None   Collection Time: 08/24/17 12:15 PM  Result Value Ref Range Status   Specimen Description EXPECTORATED SPUTUM  Final   Special Requests NONE Reflexed from [id number]  Final   Gram Stain   Final    FEW WBC PRESENT, PREDOMINANTLY PMN ABUNDANT GRAM POSITIVE COCCI IN CLUSTERS MODERATE GRAM NEGATIVE RODS FEW GRAM POSITIVE RODS    Culture   Final    Consistent with normal respiratory flora. Performed at [facility], [place] [address] [place], [place] [address]    Report Status 08/27/2017 FINAL  Final  MRSA PCR Screening     Status: Abnormal   Collection Time: 08/24/17  2:10 PM  Result Value Ref Range Status   MRSA by PCR POSITIVE (A) NEGATIVE Final    Comment:        The GeneXpert MRSA Assay (FDA approved for NASAL specimens only), is one component of a comprehensive MRSA colonization surveillance program. It is not intended to diagnose MRSA infection nor to guide or monitor treatment for MRSA infections. RESULT CALLED TO, READ BACK BY AND VERIFIED WITH: FRASER,B @ 1803 ON [id number] BY POTEAT,S      Labs: BNP (last 3 results) Recent Labs    08/23/17 2041  BNP 83.1   Basic Metabolic Panel: Recent Labs  Lab 08/25/17 0656 08/26/17 0625 08/27/17 0607 08/28/17 0415 08/29/17 0818  NA 135 134* 136 134* 135  K 3.2* 3.5 3.5 3.4* 3.6  CL 100* 99* 98* 97* 95*  CO2 28 30 32 31 31  GLUCOSE 108* 106* 106* 115* 97  BUN _0 CREATININE 0.56* 0.54* 0.57* 0.56* 0.54*  CALCIUM 8.2* 8.3* 8.6* 8.3* 8.6*  MG  --  1.6* 2.0 1.8 1.8  PHOS  --  3.1 3.0 2.8 3.2   Liver Function Tests: Recent Labs  Lab 08/27/17 0607  AST 301*  ALT 136*  ALKPHOS 123  BILITOT 0.9  PROT  6.2*  ALBUMIN 1.8*   No results for input(s): LIPASE, AMYLASE in the last 168 hours. No results for input(s): AMMONIA in the last 168 hours. CBC: Recent Labs  Lab 08/25/17 1217 08/26/17 0625 08/27/17 0607 08/28/17 0415 08/29/17 0818  WBC 3.9* 3.3* 2.7* 1.9* 1.6*  NEUTROABS  --  2.7 2.0 1.0* 0.2*  HGB 7.1* 7.6* 8.6* 7.8* 8.4*  HCT 23.4* 24.4* 27.8* 24.9* 27.4*  MCV 91.1 89.4 90.3 89.6 90.4  PLT 334 279 242 212 177   Cardiac Enzymes: No results for input(s): CKTOTAL, CKMB, CKMBINDEX, TROPONINI in the last 168 hours. BNP: Invalid input(s): POCBNP  CBG: Recent Labs  Lab 08/24/17 1731  GLUCAP 104*   D-Dimer No results for input(s): DDIMER in the last 72 hours. Hgb A1c No results for input(s): HGBA1C in the last 72 hours. Lipid Profile No results for input(s): CHOL, HDL, LDLCALC, TRIG, CHOLHDL, LDLDIRECT in the last 72 hours. Thyroid function studies No results for input(s): TSH, T4TOTAL, T3FREE, THYROIDAB in the last 72 hours.  Invalid input(s): FREET3 Anemia work up No results for input(s): VITAMINB12, FOLATE, FERRITIN, TIBC, IRON, RETICCTPCT in the last 72 hours. Urinalysis    Component Value Date/Time   COLORURINE YELLOW 08/24/2017 0722   APPEARANCEUR HAZY (A) 08/24/2017 0722   LABSPEC >1.046 (H) 08/24/2017 0722   PHURINE 5.0 08/24/2017 0722   GLUCOSEU NEGATIVE 08/24/2017 0722   HGBUR MODERATE (A) 08/24/2017 0722   BILIRUBINUR NEGATIVE 08/24/2017 0722   KETONESUR NEGATIVE 08/24/2017 0722   PROTEINUR 30 (A) 08/24/2017 0722   NITRITE NEGATIVE 08/24/2017 0722   LEUKOCYTESUR LARGE (A) 08/24/2017 0722   Sepsis Labs Invalid input(s): PROCALCITONIN,  WBC,  LACTICIDVEN   Time coordinating discharge: 40 minutes  SIGNED:  Marzetta Board, MD  [facility] 08/31/2017, 10:56 AM Pager [phone]  If 7PM-7AM, please contact night-coverage [email or web address] Password [email or web address]

## 2017-08-31 NOTE — Progress Notes (Signed)
Date: August 31, 2017 Chart review for discharge needs:  None found for case management. Patient has no questions concerning post hospital care. Velva Harman, BSN, [place], [place]

## 2017-08-31 NOTE — Progress Notes (Signed)
Report given to [place] at [facility] place. Patient will be transported via [place].

## 2017-09-04 ENCOUNTER — Other Ambulatory Visit: Payer: Medicare HMO

## 2017-09-04 NOTE — Progress Notes (Deleted)
HPI: FU atrial fibrillation. He also has a history of DVT and pulmonary emboli in 1988. Last echocardiogram in December of 2010 showed normal LV function. There was mild left ventricular hypertrophy. There was mild mitral regurgitation. Myoview in December of 2010 showed normal perfusion and normal LV function with an ejection fraction of 56%. Holter monitor in June of 2013 showed controlled atrial fibrillation. Abdominal ultrasound December 2015 showed no aneurysm. Pt has been diagnosed with lung cancer since last seen. Since last him,   Current Outpatient Medications  Medication Sig Dispense Refill  . ACCU-CHEK AVIVA PLUS test strip CHECK BLOOD SUGAR THREE TIMES DAILY 250 each 0  . ACCU-CHEK SOFTCLIX LANCETS lancets CHECK BLOOD SUGAR THREE TIMES DAILY 200 each 0  . acetaminophen (TYLENOL) 500 MG tablet Take 1,000 mg daily as needed by mouth for moderate pain or headache.    . Alcohol Swabs (ALCOHOL PADS) 70 % PADS Use to check blood sugar daily 100 each 11  . bismuth subsalicylate (PEPTO BISMOL) 262 MG/15ML suspension Take 30 mLs every 6 (six) hours as needed by mouth for indigestion.    . blood glucose meter kit and supplies KIT Dispense based on patient and insurance preference. Check blood sugar three times daily  (FOR ICD-10E11.5). 1 each 0  . cetirizine (ZYRTEC) 10 MG tablet TAKE 1 TABLET BY MOUTH EVERY DAY 30 tablet 0  . docusate sodium (COLACE) 100 MG capsule Take 100 mg by mouth daily as needed for mild constipation or moderate constipation.     . feeding supplement, GLUCERNA SHAKE, (GLUCERNA SHAKE) LIQD Take 237 mLs by mouth 3 (three) times daily between meals.  0  . glycopyrrolate (ROBINUL) 1 MG tablet Take 1 tablet (1 mg total) by mouth every 4 (four) hours as needed (excessive secretions).    . haloperidol (HALDOL) 0.5 MG tablet Take 1 tablet (0.5 mg total) by mouth every 4 (four) hours as needed for agitation (or delirium).    Marland Kitchen HYDROcodone-homatropine (HYCODAN) 5-1.5 MG/5ML  syrup Take 5 mLs by mouth every 6 (six) hours as needed for cough. 473 mL 0  . LORazepam (ATIVAN) 2 MG/ML concentrated solution Place 0.5 mLs (1 mg total) under the tongue every 4 (four) hours as needed for anxiety. 30 mL 0  . metFORMIN (GLUCOPHAGE) 500 MG tablet TAKE 1 TABLET BY MOUTH TWICE DAILY WITH MEALS 60 tablet 0  . Morphine Sulfate (MORPHINE CONCENTRATE) 10 MG/0.5ML SOLN concentrated solution Take 0.25 mLs (5 mg total) by mouth every 2 (two) hours as needed for moderate pain (or dyspnea). 180 mL   . ondansetron (ZOFRAN) 4 MG tablet Take 1 tablet (4 mg total) by mouth every 8 (eight) hours as needed for nausea or vomiting. (Patient not taking: Reported on 08/20/2017) 30 tablet 0  . polyethylene glycol (MIRALAX / GLYCOLAX) packet Take 17 g by mouth daily as needed for mild constipation or moderate constipation.     . prochlorperazine (COMPAZINE) 10 MG tablet Take 1 tablet (10 mg total) by mouth every 6 (six) hours as needed for nausea or vomiting. (Patient not taking: Reported on 08/20/2017) 30 tablet 1  . tamsulosin (FLOMAX) 0.4 MG CAPS capsule Take 1 capsule (0.4 mg total) by mouth daily. (Patient taking differently: Take 0.4 mg at bedtime by mouth. ) 90 capsule 1  . VENTOLIN HFA 108 (90 Base) MCG/ACT inhaler INHALE 1-2 PUFFS INTO LUNGS AS NEEDED (Patient taking differently: Inhale 1 to 2 puffs into the lungs every 6 hours as needed for shortness  of breath) 18 g 3   No current facility-administered medications for this visit.      Past Medical History:  Diagnosis Date  . Atrial fibrillation ([place])   . Diabetes ([place])   . DVT (deep venous thrombosis) ([place])   . Edema of lower extremity   . Hypertension   . Irritable bowel syndrome (IBS)   . Lung collapse    LEFT LUNG, TWICE  . Pulmonary embolism [facility])     Past Surgical History:  Procedure Laterality Date  . EYE SURGERY    . HERNIA REPAIR  1992  . IR THORACENTESIS ASP PLEURAL SPACE W/IMG GUIDE  06/18/2017  . VENOUS LIGATION SURGERY   1988  . VIDEO BRONCHOSCOPY Bilateral 06/25/2017   Procedure: VIDEO BRONCHOSCOPY WITH FLUORO;  Surgeon: Juanito Doom, MD;  Location: [facility];  Service: Cardiopulmonary;  Laterality: Bilateral;    Social History   Socioeconomic History  . Marital status: Married    Spouse name: Not on file  . Number of children: Not on file  . Years of education: Not on file  . Highest education level: Not on file  Social Needs  . Financial resource strain: Not on file  . Food insecurity - worry: Not on file  . Food insecurity - inability: Not on file  . Transportation needs - medical: Not on file  . Transportation needs - non-medical: Not on file  Occupational History  . Not on file  Tobacco Use  . Smoking status: Former Smoker    Packs/day: 0.25    Years: 15.00    Pack years: 3.75    Types: Cigarettes    Last attempt to quit: 07/07/2012    Years since quitting: 5.1  . Smokeless tobacco: Never Used  Substance and Sexual Activity  . Alcohol use: No    Alcohol/week: 0.0 oz  . Drug use: No  . Sexual activity: Not on file  Other Topics Concern  . Not on file  Social History Narrative  . Not on file    Family History  Problem Relation Age of Onset  . Heart attack Mother   . Heart failure Mother   . Arrhythmia Father   . Heart failure Father     ROS: no fevers or chills, productive cough, hemoptysis, dysphasia, odynophagia, melena, hematochezia, dysuria, hematuria, rash, seizure activity, orthopnea, PND, pedal edema, claudication. Remaining systems are negative.  Physical Exam: Well-developed well-nourished in no acute distress.  Skin is warm and dry.  HEENT is normal.  Neck is supple.  Chest is clear to auscultation with normal expansion.  Cardiovascular exam is regular rate and rhythm.  Abdominal exam nontender or distended. No masses palpated. Extremities show no edema. neuro grossly intact  ECG- personally reviewed  A/P  1  Kirk Ruths, MD

## 2017-09-05 ENCOUNTER — Ambulatory Visit: Payer: Medicare HMO | Admitting: Pulmonary Disease

## 2017-09-05 NOTE — Progress Notes (Deleted)
Subjective:    Patient ID: William Stanley, male    DOB: 05-18-1946, 72 y.o.   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  HPI No chief complaint on file.  ***   Past Medical History:  Diagnosis Date  . Atrial fibrillation ([place])   . Diabetes ([place])   . DVT (deep venous thrombosis) ([place])   . Edema of lower extremity   . Hypertension   . Irritable bowel syndrome (IBS)   . Lung collapse    LEFT LUNG, TWICE  . Pulmonary embolism ([place])         Review of Systems  Constitutional: Negative for fever and unexpected weight change.  HENT: Negative for congestion, dental problem, ear pain, nosebleeds, postnasal drip, rhinorrhea, sinus pressure, sneezing, sore throat and trouble swallowing.   Eyes: Negative for redness and itching.  Respiratory: Positive for cough and shortness of breath. Negative for chest tightness and wheezing.   Cardiovascular: Negative for palpitations and leg swelling.  Gastrointestinal: Negative for nausea and vomiting.  Genitourinary: Negative for dysuria.  Musculoskeletal: Negative for joint swelling.  Skin: Negative for rash.  Neurological: Positive for light-headedness and headaches.  Hematological: Does not bruise/bleed easily.  Psychiatric/Behavioral: Negative for dysphoric mood. The patient is not nervous/anxious.        Objective:   Physical Exam  There were no vitals filed for this visit.  ***   CBC    Component Value Date/Time   WBC 1.6 (L) 08/29/2017 0818   RBC 3.03 (L) 08/29/2017 0818   HGB 8.4 (L) 08/29/2017 0818   HGB 9.7 (L) 07/23/2017 1153   HCT 27.4 (L) 08/29/2017 0818   HCT 29.9 (L) 07/23/2017 1153   PLT 177 08/29/2017 0818   PLT 387 07/23/2017 1153   MCV 90.4 08/29/2017 0818   MCV 84.9 07/23/2017 1153   MCH 27.7 08/29/2017 0818   MCHC 30.7 08/29/2017 0818   RDW 17.7 (H) 08/29/2017 0818   RDW 16.8 (H) 07/23/2017 1153   LYMPHSABS 0.7 08/29/2017 0818   LYMPHSABS 0.9 07/23/2017 1153   MONOABS 0.6 08/29/2017 0818   MONOABS 0.7 07/23/2017 1153   EOSABS 0.1 08/29/2017 0818   EOSABS 0.1 07/23/2017 1153   BASOSABS 0.0 08/29/2017 0818   BASOSABS 0.0 07/23/2017 1153   Chest imaging: 05/17/2017 CT chest images independently reviewed: large left lower lobe mass, left hilar adenopathy noted, 59m subcarinal lymph node  Pathology: November 2018 left lower lobe bronchial brushing positive for squamous cell carcinoma.  Oncology records from yesterday reviewed, they plan to treat him with carboplatin based regimen also with paclitaxel. Phone records reviewed, he underwent treatment with Levaquin a week ago for a cough.     Assessment & Plan:   No diagnosis found.  ***   Current Outpatient Medications:  .  ACCU-CHEK AVIVA PLUS test strip, CHECK BLOOD SUGAR THREE TIMES DAILY, Disp: 250 each, Rfl: 0 .  ACCU-CHEK SOFTCLIX LANCETS lancets, CHECK BLOOD SUGAR THREE TIMES DAILY, Disp: 200 each, Rfl: 0 .  acetaminophen (TYLENOL) 500 MG tablet, Take 1,000 mg daily as needed by mouth for moderate pain or headache., Disp: , Rfl:  .  Alcohol Swabs (ALCOHOL PADS) 70 % PADS, Use to check blood sugar daily, Disp: 100 each, Rfl: 11 .  bismuth subsalicylate (PEPTO BISMOL) 262 MG/15ML suspension, Take 30 mLs every 6 (six) hours as needed by mouth for indigestion., Disp: , Rfl:  .  blood glucose  meter kit and supplies KIT, Dispense based on patient and insurance preference. Check blood sugar three times daily  (FOR ICD-10E11.5)., Disp: 1 each, Rfl: 0 .  cetirizine (ZYRTEC) 10 MG tablet, TAKE 1 TABLET BY MOUTH EVERY DAY, Disp: 30 tablet, Rfl: 0 .  docusate sodium (COLACE) 100 MG capsule, Take 100 mg by mouth daily as needed for mild constipation or moderate constipation. , Disp: , Rfl:  .  feeding supplement, GLUCERNA SHAKE, (GLUCERNA SHAKE) LIQD, Take 237 mLs by mouth 3 (three) times daily between meals., Disp: , Rfl: 0 .   glycopyrrolate (ROBINUL) 1 MG tablet, Take 1 tablet (1 mg total) by mouth every 4 (four) hours as needed (excessive secretions)., Disp: , Rfl:  .  haloperidol (HALDOL) 0.5 MG tablet, Take 1 tablet (0.5 mg total) by mouth every 4 (four) hours as needed for agitation (or delirium)., Disp: , Rfl:  .  HYDROcodone-homatropine (HYCODAN) 5-1.5 MG/5ML syrup, Take 5 mLs by mouth every 6 (six) hours as needed for cough., Disp: 473 mL, Rfl: 0 .  LORazepam (ATIVAN) 2 MG/ML concentrated solution, Place 0.5 mLs (1 mg total) under the tongue every 4 (four) hours as needed for anxiety., Disp: 30 mL, Rfl: 0 .  metFORMIN (GLUCOPHAGE) 500 MG tablet, TAKE 1 TABLET BY MOUTH TWICE DAILY WITH MEALS, Disp: 60 tablet, Rfl: 0 .  Morphine Sulfate (MORPHINE CONCENTRATE) 10 MG/0.5ML SOLN concentrated solution, Take 0.25 mLs (5 mg total) by mouth every 2 (two) hours as needed for moderate pain (or dyspnea)., Disp: 180 mL, Rfl:  .  ondansetron (ZOFRAN) 4 MG tablet, Take 1 tablet (4 mg total) by mouth every 8 (eight) hours as needed for nausea or vomiting. (Patient not taking: Reported on 08/20/2017), Disp: 30 tablet, Rfl: 0 .  polyethylene glycol (MIRALAX / GLYCOLAX) packet, Take 17 g by mouth daily as needed for mild constipation or moderate constipation. , Disp: , Rfl:  .  prochlorperazine (COMPAZINE) 10 MG tablet, Take 1 tablet (10 mg total) by mouth every 6 (six) hours as needed for nausea or vomiting. (Patient not taking: Reported on 08/20/2017), Disp: 30 tablet, Rfl: 1 .  tamsulosin (FLOMAX) 0.4 MG CAPS capsule, Take 1 capsule (0.4 mg total) by mouth daily. (Patient taking differently: Take 0.4 mg at bedtime by mouth. ), Disp: 90 capsule, Rfl: 1 .  VENTOLIN HFA 108 (90 Base) MCG/ACT inhaler, INHALE 1-2 PUFFS INTO LUNGS AS NEEDED (Patient taking differently: Inhale 1 to 2 puffs into the lungs every 6 hours as needed for shortness of breath), Disp: 18 g, Rfl: 3

## 2017-09-06 ENCOUNTER — Telehealth: Payer: Self-pay | Admitting: Oncology

## 2017-09-06 NOTE — Telephone Encounter (Signed)
Patient called and left a message requesting a return call to discuss his condition.

## 2017-09-10 ENCOUNTER — Other Ambulatory Visit: Payer: Medicare HMO

## 2017-09-10 ENCOUNTER — Ambulatory Visit: Payer: Medicare HMO

## 2017-09-10 ENCOUNTER — Other Ambulatory Visit: Payer: Self-pay | Admitting: Medical Oncology

## 2017-09-10 ENCOUNTER — Encounter: Payer: Medicare HMO | Admitting: Nutrition

## 2017-09-10 ENCOUNTER — Ambulatory Visit: Payer: Medicare HMO | Admitting: Internal Medicine

## 2017-09-10 DIAGNOSIS — C3492 Malignant neoplasm of unspecified part of left bronchus or lung: Secondary | ICD-10-CM

## 2017-09-12 ENCOUNTER — Ambulatory Visit: Payer: Medicare HMO

## 2017-09-13 ENCOUNTER — Ambulatory Visit: Payer: Medicare HMO | Admitting: Cardiology

## 2017-09-17 ENCOUNTER — Ambulatory Visit: Payer: Medicare HMO

## 2017-09-17 ENCOUNTER — Other Ambulatory Visit: Payer: Self-pay | Admitting: Physician Assistant

## 2017-09-17 ENCOUNTER — Ambulatory Visit: Payer: Medicare HMO | Admitting: Oncology

## 2017-09-17 ENCOUNTER — Other Ambulatory Visit: Payer: Medicare HMO

## 2017-09-17 ENCOUNTER — Encounter: Payer: Medicare HMO | Admitting: Nutrition

## 2017-09-17 ENCOUNTER — Telehealth: Payer: Self-pay | Admitting: Cardiology

## 2017-09-17 NOTE — Telephone Encounter (Signed)
Patient's sister has been made aware that the appointment has been cancelled and he was not considered a no show.

## 2017-09-17 NOTE — Telephone Encounter (Signed)
Refill appropriate 

## 2017-09-17 NOTE — Telephone Encounter (Signed)
New message   Pt sister says that the pt missed his appt on 2/7 because he is not at [facility] and did not know. They want to know if he will still be charged the no show fee considering. Please call

## 2017-09-18 ENCOUNTER — Other Ambulatory Visit: Payer: Medicare HMO

## 2017-09-19 ENCOUNTER — Ambulatory Visit: Payer: Medicare HMO

## 2017-09-25 ENCOUNTER — Other Ambulatory Visit: Payer: Medicare HMO

## 2017-10-01 ENCOUNTER — Ambulatory Visit: Payer: Medicare HMO

## 2017-10-01 ENCOUNTER — Encounter: Payer: Medicare HMO | Admitting: Nutrition

## 2017-10-01 ENCOUNTER — Ambulatory Visit: Payer: Medicare HMO | Admitting: Internal Medicine

## 2017-10-01 ENCOUNTER — Other Ambulatory Visit: Payer: Medicare HMO

## 2017-10-03 ENCOUNTER — Ambulatory Visit: Payer: Medicare HMO

## 2017-10-09 ENCOUNTER — Other Ambulatory Visit: Payer: Medicare HMO

## 2017-10-14 ENCOUNTER — Other Ambulatory Visit: Payer: Self-pay | Admitting: Physician Assistant

## 2017-10-16 ENCOUNTER — Other Ambulatory Visit: Payer: Medicare HMO

## 2017-10-22 ENCOUNTER — Ambulatory Visit: Payer: Medicare HMO

## 2017-10-22 ENCOUNTER — Other Ambulatory Visit: Payer: Medicare HMO

## 2017-10-22 ENCOUNTER — Ambulatory Visit: Payer: Medicare HMO | Admitting: Internal Medicine

## 2017-11-05 DEATH — deceased

## 2018-11-14 IMAGING — CT CT ANGIO CHEST
4 of 17 series · 17 of 47 positions shown · IV contrast (ISOVUE 370)
Comparison: Chest radiograph from one day prior. 06/25/2017 PET-CT
and 05/17/2017 chest CT.

CLINICAL DATA: Left lower lobe lung cancer on chemotherapy.
Dyspnea.

EXAM:
CT ANGIOGRAPHY CHEST WITH CONTRAST
TECHNIQUE: Multidetector CT imaging of the chest was performed using the
standard protocol during bolus administration of intravenous
contrast. Multiplanar CT image reconstructions and MIPs were
obtained to evaluate the vascular anatomy.
CONTRAST:  100mL 5Q28H1-L17 IOPAMIDOL (5Q28H1-L17) INJECTION 76%

[Series 8: thins · axial · 0.68mm/px · z∈[+1256,+1446]mm · 9 of 238 slices shown]
[im 24/238  lung]
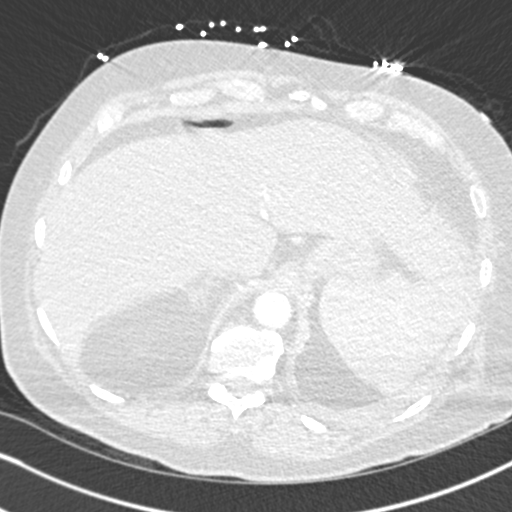
[im 48/238  soft-tissue]
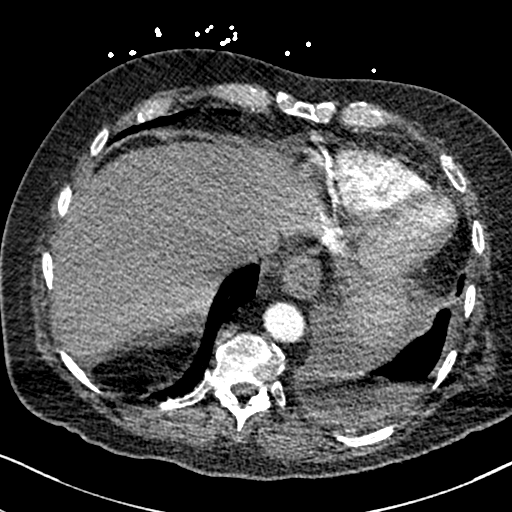
[im 72/238  lung]
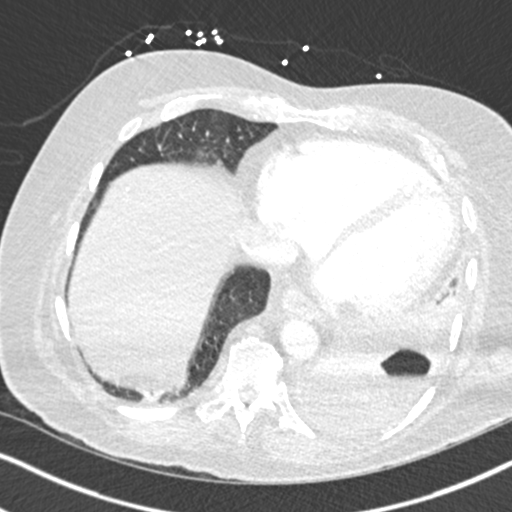
[im 95/238  soft-tissue]
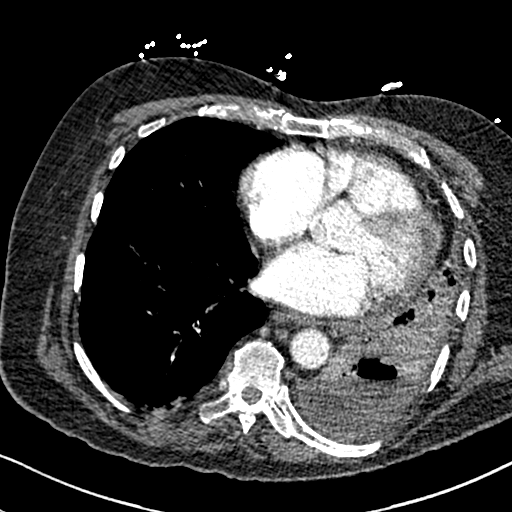
[im 119/238  lung]
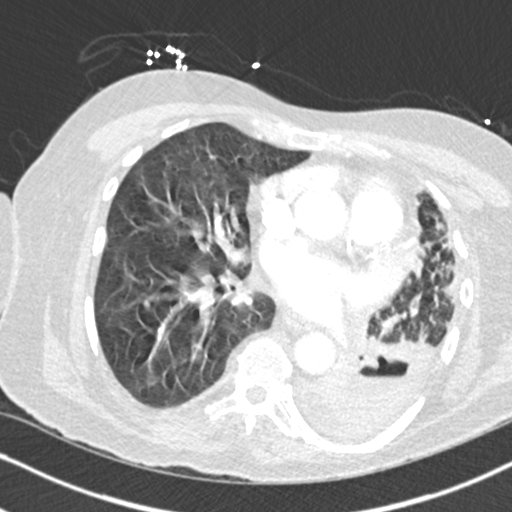
[im 143/238  soft-tissue]
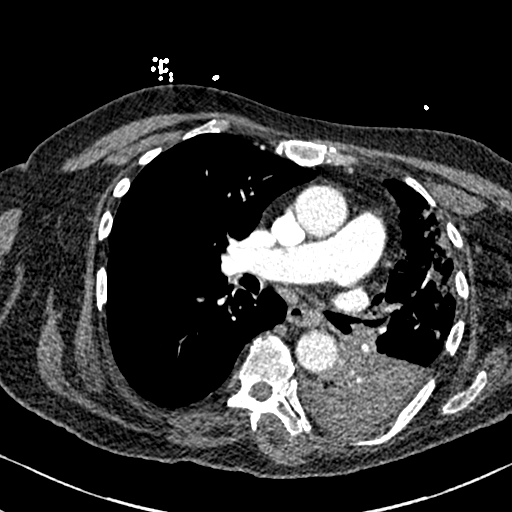
[im 166/238  lung]
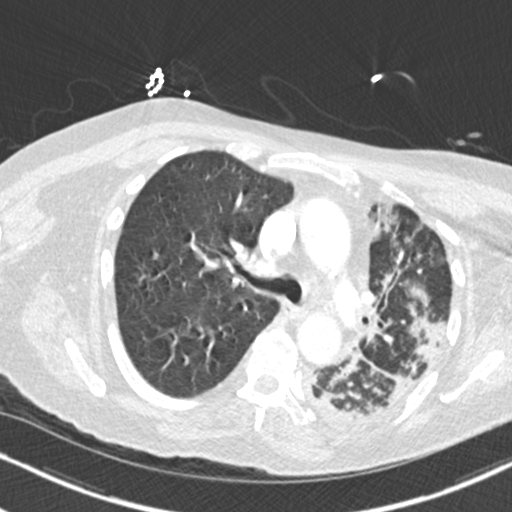
[im 190/238  soft-tissue]
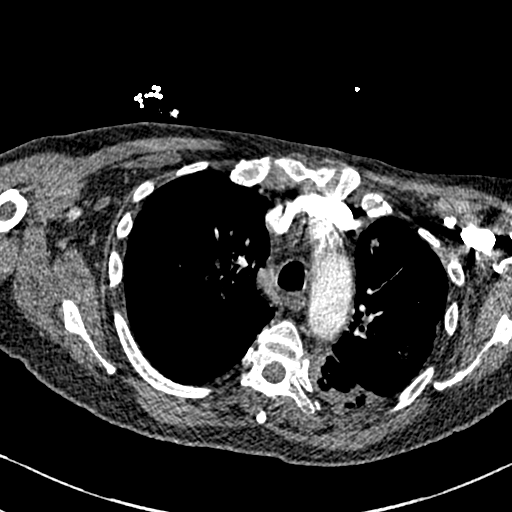
[im 214/238  lung]
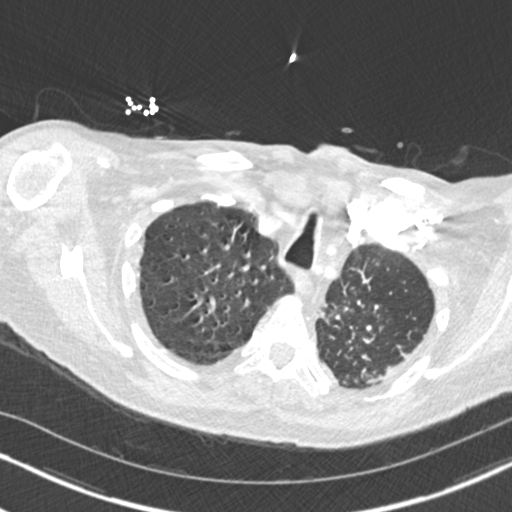

[Series 12: lung · axial · 0.68mm/px · z∈[+1316,+1368]mm · 2 of 103 slices shown]
[im 26/103  soft-tissue]
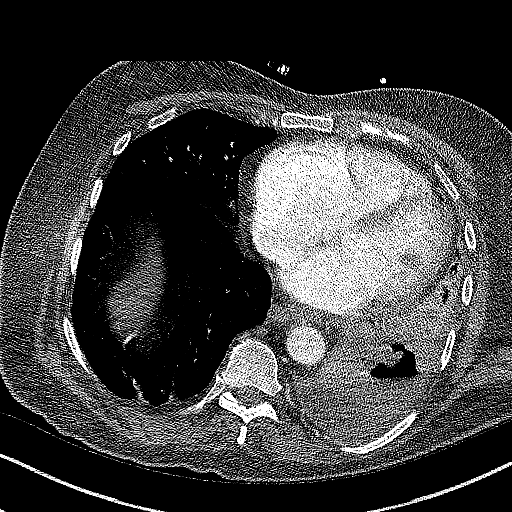
[im 52/103  soft-tissue]
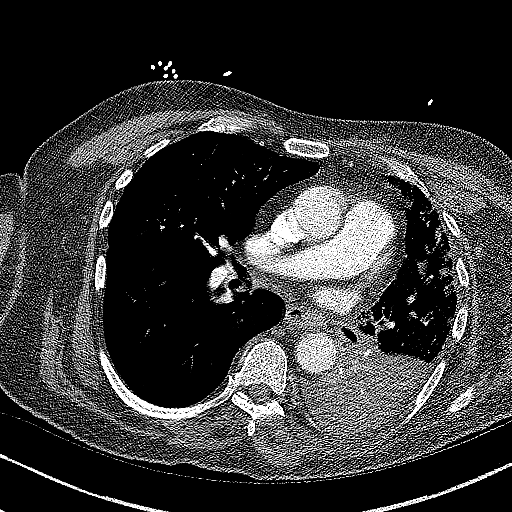

[Series 22: l spine st · axial · 0.33mm/px · z∈[+1072,+1188]mm · 3 of 118 slices shown]
[im 30/118  lung]
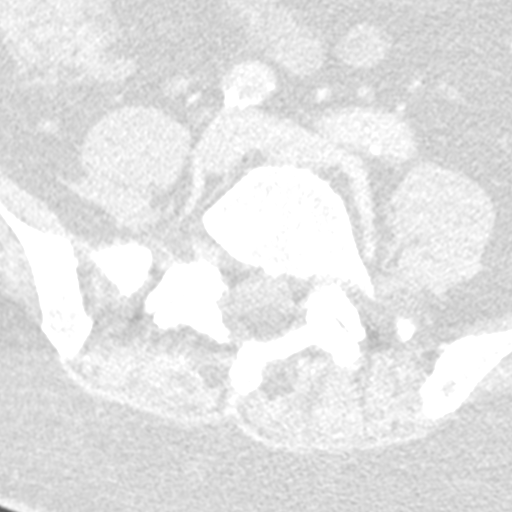
[im 59/118  lung]
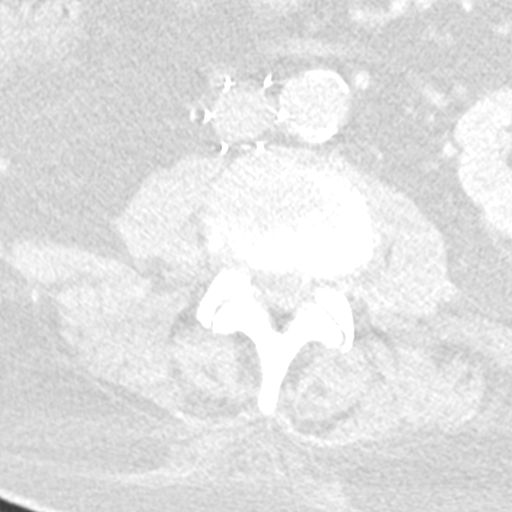
[im 88/118  lung]
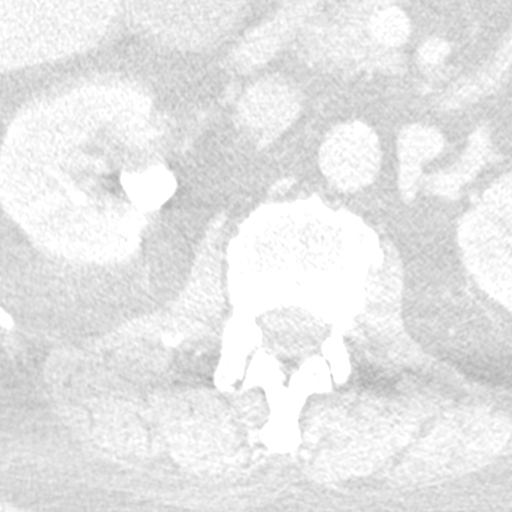

[Series 29: axial reformats st · axial · 0.27mm/px · z∈[+1077,+1192]mm · 3 of 118 slices shown]
[im 30/118  lung]
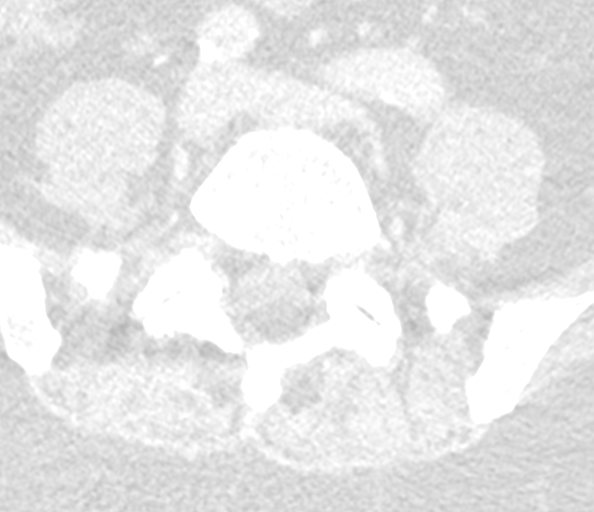
[im 59/118  lung]
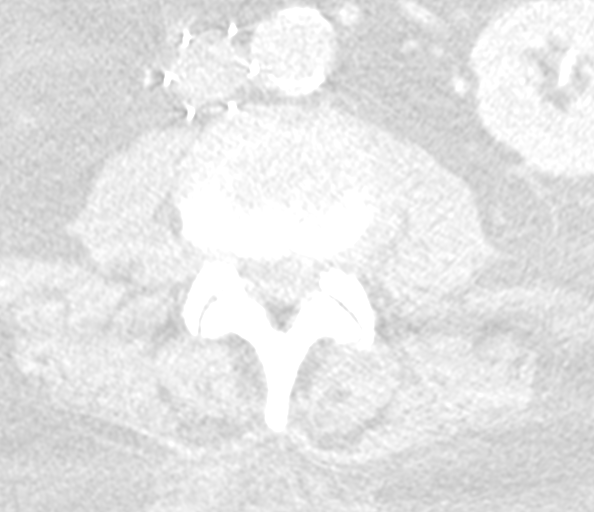
[im 88/118  lung]
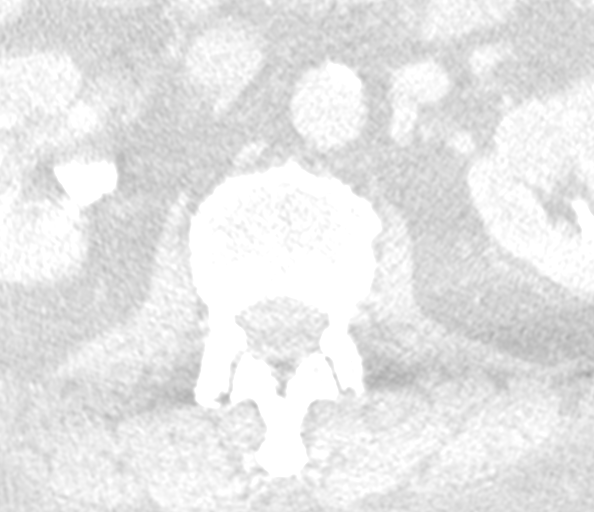

[17 of 47 positions shown; findings below may reference images not displayed]

FINDINGS: Cardiovascular: The study is low quality for the evaluation of
pulmonary embolism, significantly limited by motion artifact, which
largely precludes evaluation of the segmental and subsegmental
pulmonary artery branches. There are no filling defects in the
central, lobar or segmental pulmonary artery branches to suggest
acute pulmonary embolism. Atherosclerotic nonaneurysmal thoracic
aorta top-normal caliber main pulmonary artery (3.0 cm diameter),
stable. Top-normal heart size. No significant pericardial
fluid/thickening. Left main and 3 vessel coronary atherosclerosis.

Mediastinum/Nodes: No discrete thyroid nodules. Unremarkable
esophagus. No axillary adenopathy. Mildly enlarged 1.1 cm left upper
paraesophageal mediastinal node (series 8/image 37), increased from
0.7 cm on 06/25/2017 PET-CT. Stable mildly enlarged 1.0 cm left
subcarinal node (series 8/image 102). Mildly enlarged 1.0 cm right
lower paraesophageal mediastinal node (series 8/image 154),
increased from 0.8 cm on 06/25/2017. Mildly enlarged 1.0 cm left
hilar node (series 8/image 90), decreased from 1.4 cm on 05/17/2017
chest CT. No right hilar adenopathy.

Lungs/Pleura: No pneumothorax. No right pleural effusion. Small to
moderate dependent basilar left hydropneumothorax with
circumferential left pleural thickening and enhancement, slightly
increased in size with new pneumothorax component since 06/25/2017
PET-CT. Patchy consolidation throughout the left upper lobe and left
lower lobe, largely new in the left upper lobe and slightly worsened
in the left lower lobe. Centrally necrotic left lower lobe lung mass
measures approximately 5.9 x 4.7 cm and contains an air-fluid level
(series 8/image 135), decreased from 7.2 x 6.6 cm on 06/25/2017
PET-CT using similar measurement technique. There is direct
communication of the cavitary left lower lobe lung mass with a
segmental left lower lobe bronchus and with the left pleural space,
compatible with a new bronchopleural fistula.

Upper abdomen: Small hiatal hernia.

Musculoskeletal: No aggressive appearing focal osseous lesions.
Moderate thoracic spondylosis.

Review of the MIP images confirms the above findings.
IMPRESSION: 1. No evidence of pulmonary embolism on this limited motion degraded
scan.
2. Cavitary left lower lobe lung mass is decreased in size.
3. New directly visualized bronchopleural fistula in the left lower
lobe frankly communicating with loculated malignant basilar left
hydropneumothorax.
4. New/increased postobstructive pneumonia throughout the left lung.
5. Left hilar adenopathy is decreased.
6. Mediastinal adenopathy is mildly increased, cannot exclude
progressive metastatic mediastinal adenopathy.
7. Left main and 3 vessel coronary atherosclerosis.

Aortic Atherosclerosis ([id number].Z).

## 2018-11-16 IMAGING — DX DG CHEST 2V
2 series · 2 of 2 positions shown · non-contrast
Comparison: Chest CT 08/24/2017

CLINICAL DATA: Pitting edema.

EXAM:
CHEST  2 VIEW

[chest lat]
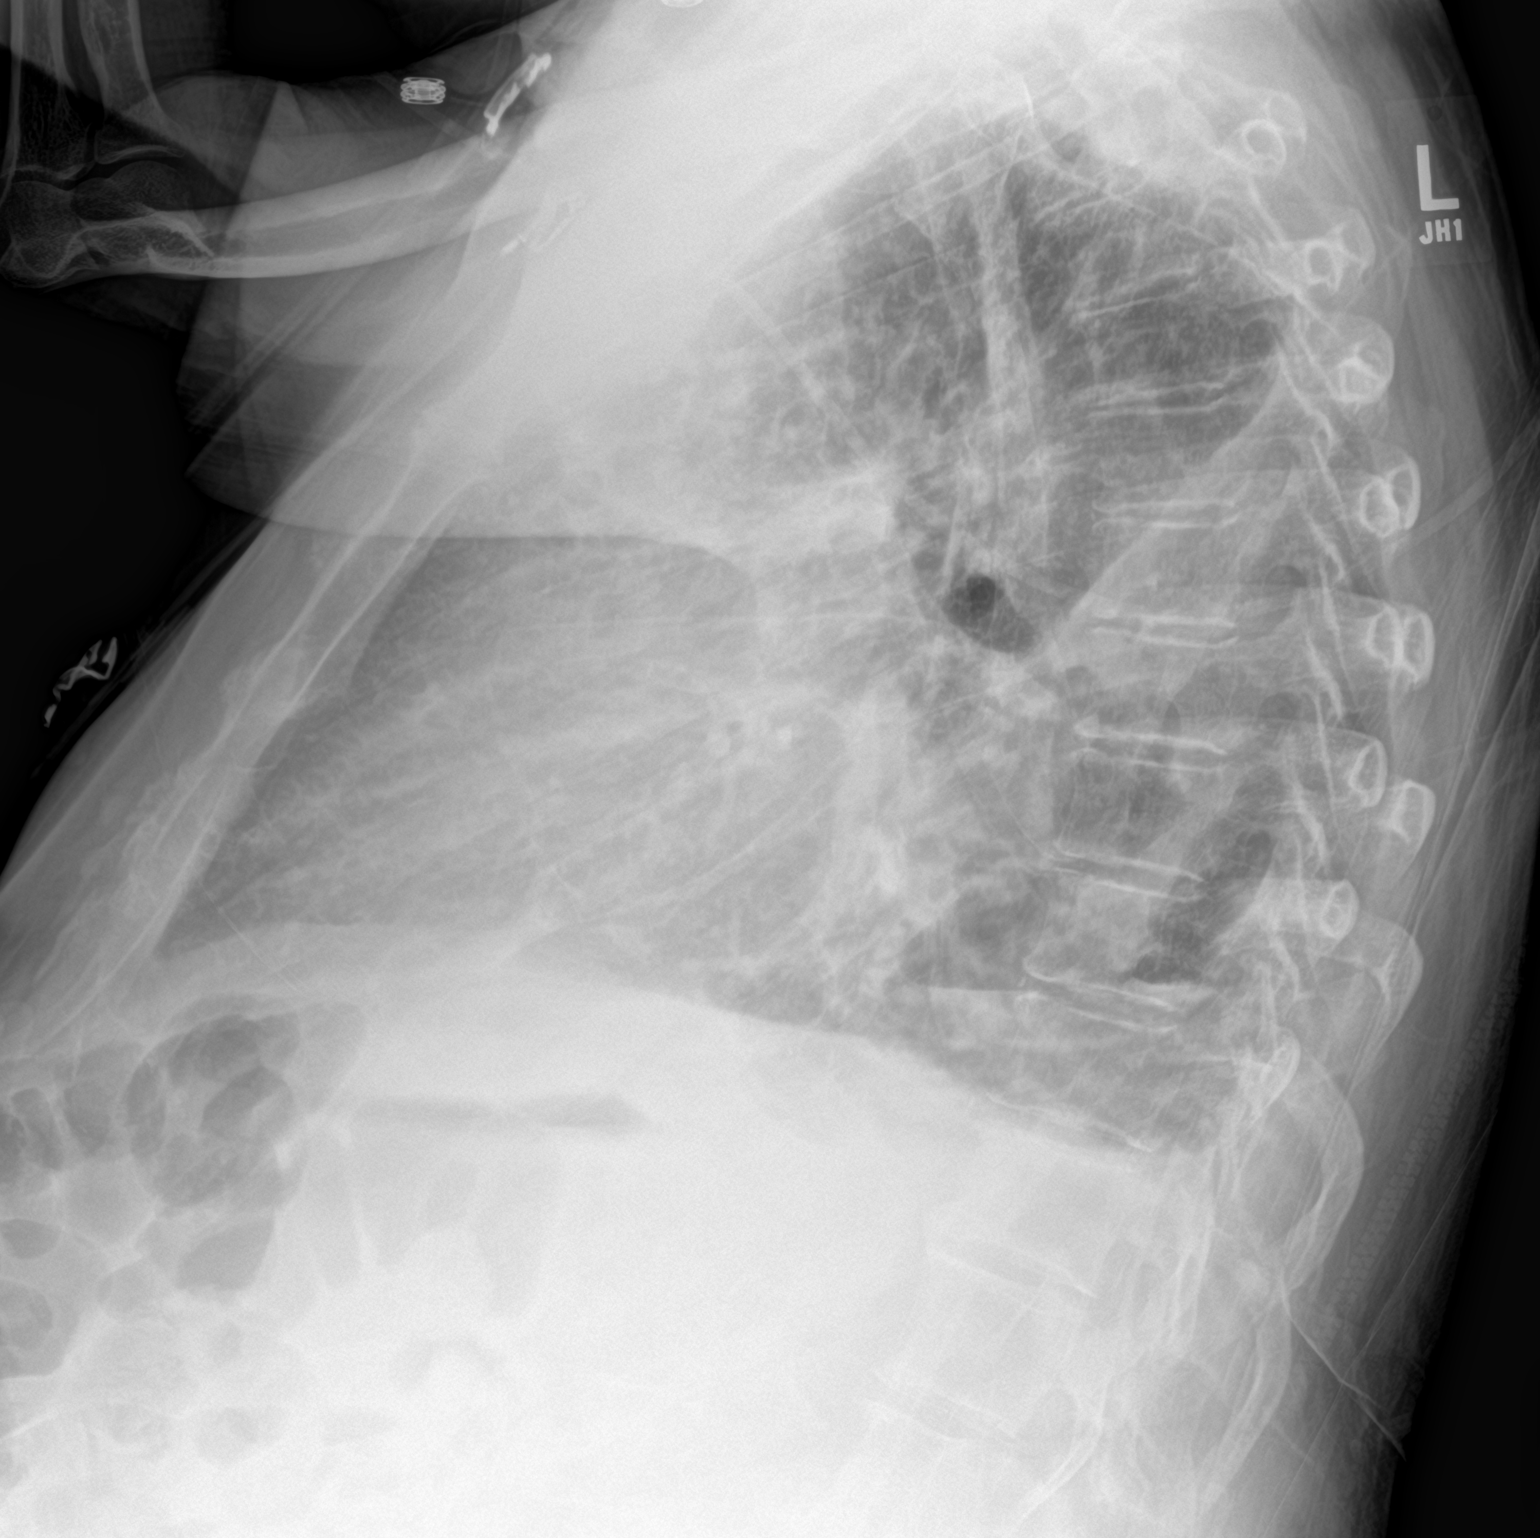

[chest ap]
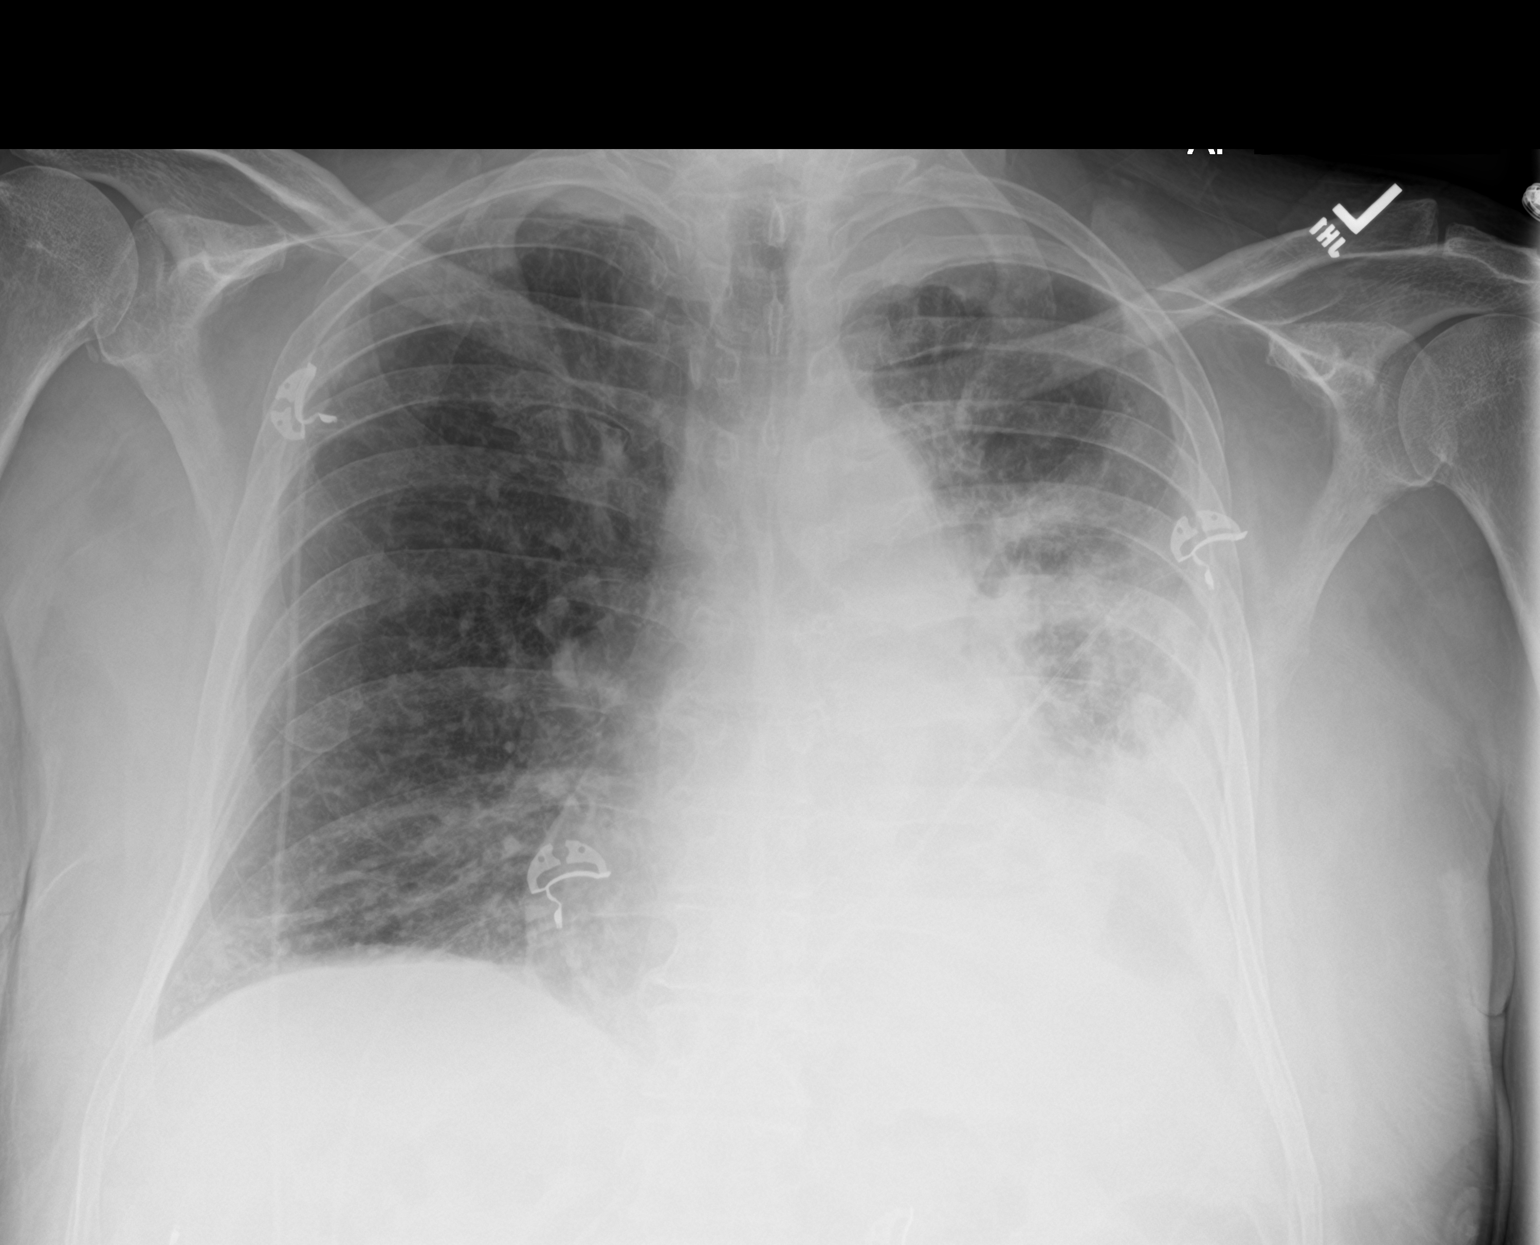

[2 of 2 positions shown; findings below may reference images not displayed]

FINDINGS: The cardiac silhouette is partially obscured by dense left lower
lobe airspace consolidation and left hydropneumothorax. The aeration
of the left lung has slightly worsened when compared to 08/23/2017.
Patchy peribronchial airspace opacities are now seen in the left
upper lobe and left middle lobe in addition to the left lower lobe
malignant process.

Mild atelectasis versus airspace consolidation in the right lower
lobe. Right apical peribronchial thickening.
IMPRESSION: Slight worsening of the aeration of the left lung with known left
lower lobe postobstructive airspace consolidation and malignant left
hydropneumothorax.

Slight increase in peribronchial airspace consolidation in the left
middle and left upper lobes.

## 2018-11-18 IMAGING — DX DG CHEST 1V
1 series · 1 of 1 positions shown · non-contrast
Comparison: 08/26/2017.  CT 08/24/2017.

CLINICAL DATA: Shortness of breath.

EXAM:
CHEST 1 VIEW

[chest ap]
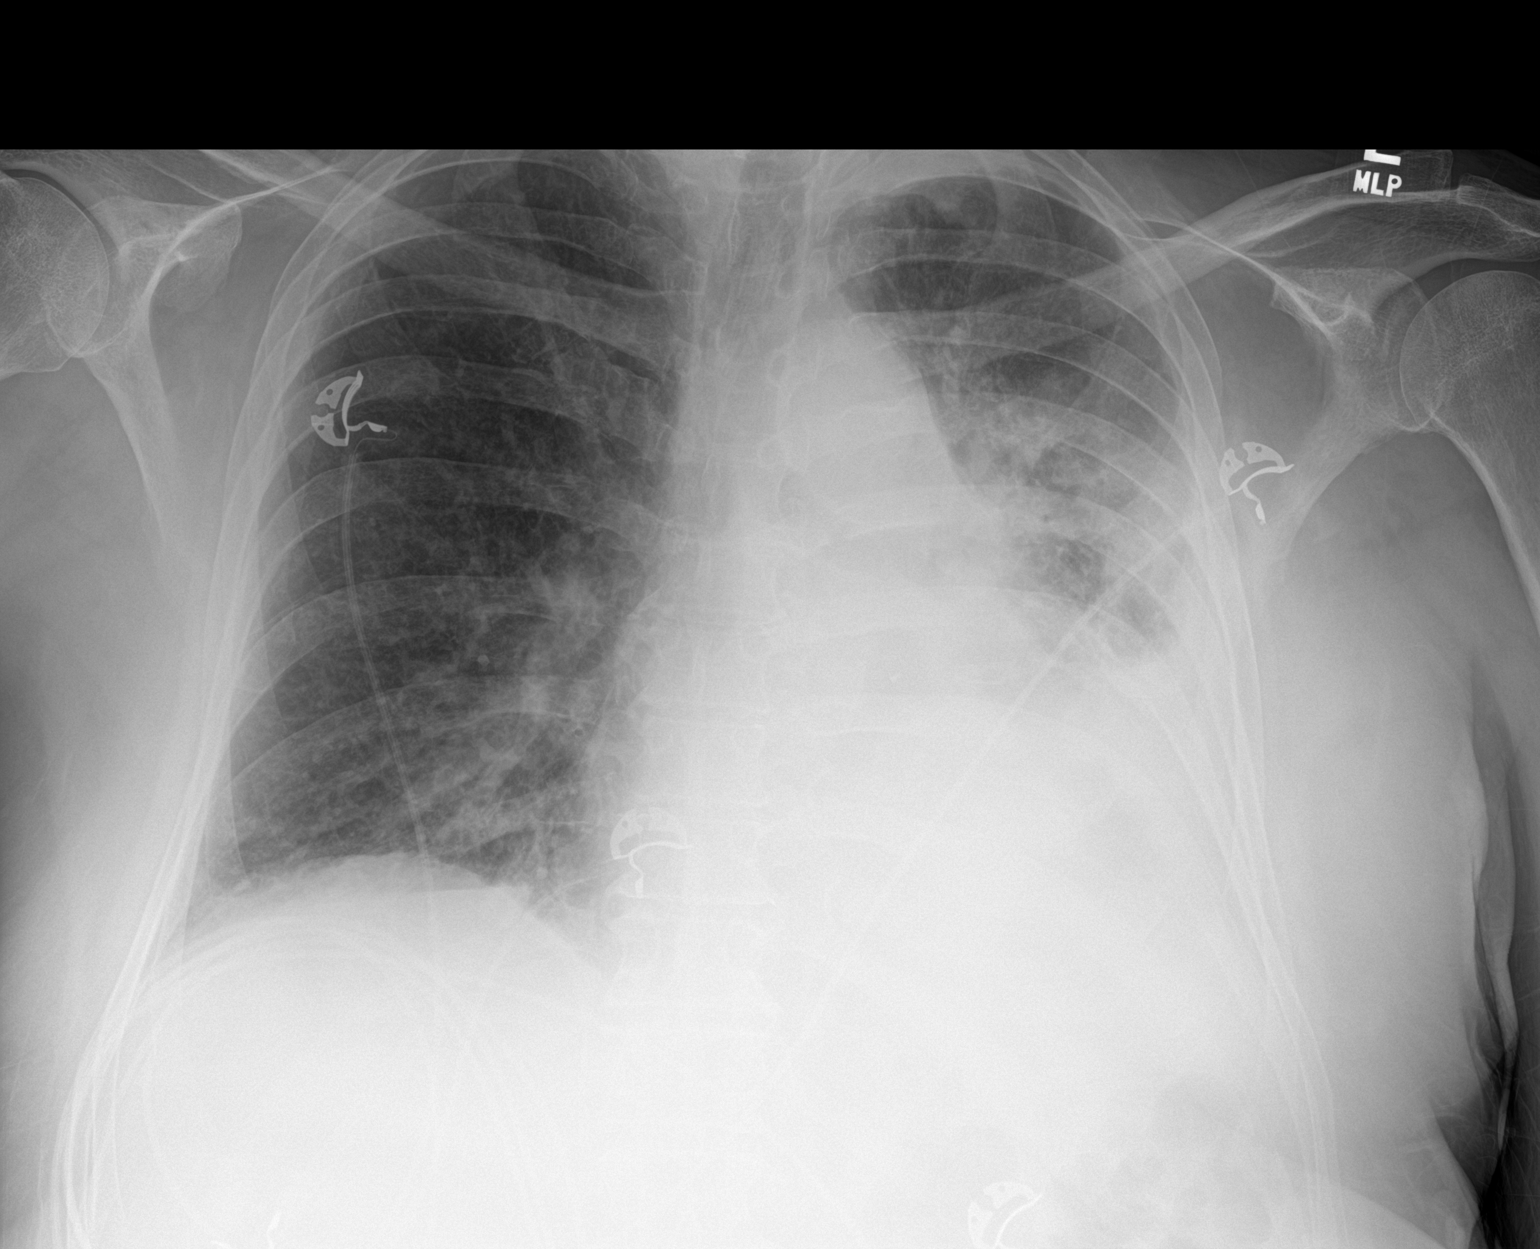

[1 of 1 positions shown; findings below may reference images not displayed]

FINDINGS: Mediastinum hilar structures stable. Stable cardiomegaly. Persistent
consolidation in the left mid and lower lung with persistent
left-sided pleural effusion. Cavitary process in the left lung base
best identified by prior CT. Persistent mild right lung interstitial
prominence. No pneumothorax identified.
IMPRESSION: 1. Persistent dense consolidation of the left mid and lower lung
with persistent left-sided pleural effusion. Cavitary process in the
left lung base best identified by prior CT.

2. Stable cardiomegaly. Persistent mild increase interstitial
markings noted within the right lung. Component mild interstitial
edema may be present.
